# Patient Record
Sex: Female | Born: 1935 | Race: Black or African American | Hispanic: No | Marital: Married | Smoking: Never smoker
Health system: Southern US, Community
[De-identification: ages and names within clinical notes are randomized; demographics above are authoritative.]

## PROBLEM LIST (undated history)

## (undated) DIAGNOSIS — J189 Pneumonia, unspecified organism: Secondary | ICD-10-CM

## (undated) DIAGNOSIS — F0280 Dementia in other diseases classified elsewhere without behavioral disturbance: Secondary | ICD-10-CM

## (undated) DIAGNOSIS — E785 Hyperlipidemia, unspecified: Secondary | ICD-10-CM

## (undated) DIAGNOSIS — G2581 Restless legs syndrome: Secondary | ICD-10-CM

## (undated) DIAGNOSIS — E871 Hypo-osmolality and hyponatremia: Secondary | ICD-10-CM

## (undated) DIAGNOSIS — G309 Alzheimer's disease, unspecified: Secondary | ICD-10-CM

## (undated) DIAGNOSIS — K219 Gastro-esophageal reflux disease without esophagitis: Secondary | ICD-10-CM

## (undated) DIAGNOSIS — I10 Essential (primary) hypertension: Secondary | ICD-10-CM

## (undated) DIAGNOSIS — K5909 Other constipation: Secondary | ICD-10-CM

## (undated) DIAGNOSIS — N189 Chronic kidney disease, unspecified: Secondary | ICD-10-CM

## (undated) DIAGNOSIS — F0390 Unspecified dementia without behavioral disturbance: Secondary | ICD-10-CM

## (undated) DIAGNOSIS — L8990 Pressure ulcer of unspecified site, unspecified stage: Secondary | ICD-10-CM

## (undated) DIAGNOSIS — E559 Vitamin D deficiency, unspecified: Secondary | ICD-10-CM

## (undated) DIAGNOSIS — E7800 Pure hypercholesterolemia, unspecified: Secondary | ICD-10-CM

## (undated) DIAGNOSIS — R7303 Prediabetes: Secondary | ICD-10-CM

## (undated) DIAGNOSIS — I639 Cerebral infarction, unspecified: Secondary | ICD-10-CM

## (undated) DIAGNOSIS — R569 Unspecified convulsions: Secondary | ICD-10-CM

## (undated) DIAGNOSIS — M1990 Unspecified osteoarthritis, unspecified site: Secondary | ICD-10-CM

## (undated) DIAGNOSIS — H109 Unspecified conjunctivitis: Secondary | ICD-10-CM

## (undated) DIAGNOSIS — F419 Anxiety disorder, unspecified: Secondary | ICD-10-CM

## (undated) HISTORY — DX: Anxiety disorder, unspecified: F41.9

## (undated) HISTORY — DX: Unspecified osteoarthritis, unspecified site: M19.90

## (undated) HISTORY — PX: CATARACT EXTRACTION W/ INTRAOCULAR LENS  IMPLANT, BILATERAL: SHX1307

## (undated) HISTORY — DX: Chronic kidney disease, unspecified: N18.9

## (undated) HISTORY — DX: Unspecified conjunctivitis: H10.9

## (undated) HISTORY — DX: Restless legs syndrome: G25.81

## (undated) HISTORY — DX: Other constipation: K59.09

## (undated) HISTORY — PX: TOTAL KNEE ARTHROPLASTY: SHX125

## (undated) HISTORY — DX: Vitamin D deficiency, unspecified: E55.9

## (undated) HISTORY — PX: CATARACT EXTRACTION: SUR2

## (undated) HISTORY — DX: Hyperlipidemia, unspecified: E78.5

## (undated) HISTORY — DX: Pressure ulcer of unspecified site, unspecified stage: L89.90

---

## 1998-02-23 ENCOUNTER — Ambulatory Visit (HOSPITAL_COMMUNITY): Admission: RE | Admit: 1998-02-23 | Discharge: 1998-02-23 | Payer: Self-pay | Admitting: Cardiology

## 1998-12-21 ENCOUNTER — Other Ambulatory Visit: Admission: RE | Admit: 1998-12-21 | Discharge: 1998-12-21 | Payer: Self-pay | Admitting: *Deleted

## 1999-01-23 ENCOUNTER — Ambulatory Visit (HOSPITAL_COMMUNITY): Admission: RE | Admit: 1999-01-23 | Discharge: 1999-01-23 | Payer: Self-pay | Admitting: Cardiology

## 1999-12-24 ENCOUNTER — Other Ambulatory Visit: Admission: RE | Admit: 1999-12-24 | Discharge: 1999-12-24 | Payer: Self-pay | Admitting: *Deleted

## 2000-03-27 ENCOUNTER — Encounter: Payer: Self-pay | Admitting: Specialist

## 2000-04-04 ENCOUNTER — Inpatient Hospital Stay (HOSPITAL_COMMUNITY): Admission: RE | Admit: 2000-04-04 | Discharge: 2000-04-09 | Payer: Self-pay | Admitting: Specialist

## 2001-02-02 ENCOUNTER — Other Ambulatory Visit: Admission: RE | Admit: 2001-02-02 | Discharge: 2001-02-02 | Payer: Self-pay | Admitting: *Deleted

## 2006-02-10 ENCOUNTER — Ambulatory Visit (HOSPITAL_COMMUNITY): Admission: RE | Admit: 2006-02-10 | Discharge: 2006-02-10 | Payer: Self-pay | Admitting: Ophthalmology

## 2006-02-28 ENCOUNTER — Observation Stay (HOSPITAL_COMMUNITY): Admission: EM | Admit: 2006-02-28 | Discharge: 2006-03-01 | Payer: Self-pay | Admitting: Emergency Medicine

## 2006-07-02 ENCOUNTER — Emergency Department (HOSPITAL_COMMUNITY): Admission: EM | Admit: 2006-07-02 | Discharge: 2006-07-03 | Payer: Self-pay | Admitting: Emergency Medicine

## 2006-11-15 ENCOUNTER — Emergency Department (HOSPITAL_COMMUNITY): Admission: EM | Admit: 2006-11-15 | Discharge: 2006-11-15 | Payer: Self-pay | Admitting: Emergency Medicine

## 2008-05-02 ENCOUNTER — Emergency Department (HOSPITAL_COMMUNITY): Admission: EM | Admit: 2008-05-02 | Discharge: 2008-05-02 | Payer: Self-pay | Admitting: Emergency Medicine

## 2008-05-04 ENCOUNTER — Inpatient Hospital Stay (HOSPITAL_COMMUNITY): Admission: EM | Admit: 2008-05-04 | Discharge: 2008-05-13 | Payer: Self-pay | Admitting: Emergency Medicine

## 2008-10-02 ENCOUNTER — Emergency Department (HOSPITAL_COMMUNITY): Admission: EM | Admit: 2008-10-02 | Discharge: 2008-10-03 | Payer: Self-pay | Admitting: Emergency Medicine

## 2008-10-17 ENCOUNTER — Emergency Department (HOSPITAL_COMMUNITY): Admission: EM | Admit: 2008-10-17 | Discharge: 2008-10-17 | Payer: Self-pay | Admitting: Emergency Medicine

## 2008-12-08 ENCOUNTER — Emergency Department (HOSPITAL_COMMUNITY): Admission: EM | Admit: 2008-12-08 | Discharge: 2008-12-08 | Payer: Self-pay | Admitting: Emergency Medicine

## 2009-04-03 ENCOUNTER — Emergency Department (HOSPITAL_COMMUNITY): Admission: EM | Admit: 2009-04-03 | Discharge: 2009-04-03 | Payer: Self-pay | Admitting: Emergency Medicine

## 2009-04-17 ENCOUNTER — Ambulatory Visit: Payer: Self-pay | Admitting: Surgery

## 2009-04-17 ENCOUNTER — Encounter (INDEPENDENT_AMBULATORY_CARE_PROVIDER_SITE_OTHER): Payer: Self-pay | Admitting: Emergency Medicine

## 2009-04-17 ENCOUNTER — Emergency Department (HOSPITAL_COMMUNITY): Admission: EM | Admit: 2009-04-17 | Discharge: 2009-04-17 | Payer: Self-pay | Admitting: Emergency Medicine

## 2009-06-06 ENCOUNTER — Emergency Department (HOSPITAL_COMMUNITY): Admission: EM | Admit: 2009-06-06 | Discharge: 2009-06-06 | Payer: Self-pay | Admitting: Emergency Medicine

## 2009-10-31 ENCOUNTER — Emergency Department (HOSPITAL_COMMUNITY)
Admission: EM | Admit: 2009-10-31 | Discharge: 2009-10-31 | Payer: Self-pay | Source: Home / Self Care | Admitting: Emergency Medicine

## 2009-11-07 ENCOUNTER — Emergency Department (HOSPITAL_COMMUNITY): Admission: EM | Admit: 2009-11-07 | Discharge: 2009-11-07 | Payer: Self-pay | Admitting: Emergency Medicine

## 2009-11-24 ENCOUNTER — Emergency Department (HOSPITAL_COMMUNITY): Admission: EM | Admit: 2009-11-24 | Discharge: 2009-11-24 | Payer: Self-pay | Admitting: Emergency Medicine

## 2009-12-07 ENCOUNTER — Emergency Department (HOSPITAL_COMMUNITY): Admission: EM | Admit: 2009-12-07 | Discharge: 2009-12-07 | Payer: Self-pay | Admitting: Emergency Medicine

## 2009-12-20 ENCOUNTER — Inpatient Hospital Stay (HOSPITAL_COMMUNITY): Admission: EM | Admit: 2009-12-20 | Discharge: 2009-12-21 | Payer: Self-pay | Admitting: Emergency Medicine

## 2010-01-15 ENCOUNTER — Emergency Department (HOSPITAL_COMMUNITY): Admission: EM | Admit: 2010-01-15 | Discharge: 2010-01-15 | Payer: Self-pay | Admitting: Emergency Medicine

## 2010-01-28 ENCOUNTER — Emergency Department (HOSPITAL_COMMUNITY): Admission: EM | Admit: 2010-01-28 | Discharge: 2010-01-28 | Payer: Self-pay | Admitting: Emergency Medicine

## 2010-02-05 ENCOUNTER — Emergency Department (HOSPITAL_COMMUNITY): Admission: EM | Admit: 2010-02-05 | Discharge: 2010-02-05 | Payer: Self-pay | Admitting: Emergency Medicine

## 2010-05-20 ENCOUNTER — Emergency Department (HOSPITAL_COMMUNITY)
Admission: EM | Admit: 2010-05-20 | Discharge: 2010-05-20 | Payer: Self-pay | Source: Home / Self Care | Admitting: Emergency Medicine

## 2010-08-22 ENCOUNTER — Inpatient Hospital Stay (HOSPITAL_COMMUNITY)
Admission: EM | Admit: 2010-08-22 | Discharge: 2010-08-25 | Payer: Self-pay | Source: Home / Self Care | Attending: Internal Medicine | Admitting: Internal Medicine

## 2010-08-27 LAB — CBC
HCT: 32.1 % — ABNORMAL LOW (ref 36.0–46.0)
HCT: 34.9 % — ABNORMAL LOW (ref 36.0–46.0)
HCT: 35.2 % — ABNORMAL LOW (ref 36.0–46.0)
HCT: 31.6 % — ABNORMAL LOW (ref 36.0–46.0)
Hemoglobin: 10.9 g/dL — ABNORMAL LOW (ref 12.0–15.0)
Hemoglobin: 11.3 g/dL — ABNORMAL LOW (ref 12.0–15.0)
Hemoglobin: 11.6 g/dL — ABNORMAL LOW (ref 12.0–15.0)
Hemoglobin: 10.6 g/dL — ABNORMAL LOW (ref 12.0–15.0)
MCH: 29.4 pg (ref 26.0–34.0)
MCH: 30.6 pg (ref 26.0–34.0)
MCH: 30.7 pg (ref 26.0–34.0)
MCH: 30.4 pg (ref 26.0–34.0)
MCHC: 32.4 g/dL (ref 30.0–36.0)
MCHC: 33 g/dL (ref 30.0–36.0)
MCHC: 34 g/dL (ref 30.0–36.0)
MCHC: 33.5 g/dL (ref 30.0–36.0)
MCV: 90.4 fL (ref 78.0–100.0)
MCV: 90.9 fL (ref 78.0–100.0)
MCV: 92.9 fL (ref 78.0–100.0)
MCV: 90.5 fL (ref 78.0–100.0)
Platelets: 176 10*3/uL (ref 150–400)
Platelets: 182 10*3/uL (ref 150–400)
Platelets: 207 10*3/uL (ref 150–400)
Platelets: 197 10*3/uL (ref 150–400)
RBC: 3.55 MIL/uL — ABNORMAL LOW (ref 3.87–5.11)
RBC: 3.79 MIL/uL — ABNORMAL LOW (ref 3.87–5.11)
RBC: 3.84 MIL/uL — ABNORMAL LOW (ref 3.87–5.11)
RBC: 3.49 MIL/uL — ABNORMAL LOW (ref 3.87–5.11)
RDW: 13.1 % (ref 11.5–15.5)
RDW: 13.1 % (ref 11.5–15.5)
RDW: 13.2 % (ref 11.5–15.5)
RDW: 13.1 % (ref 11.5–15.5)
WBC: 6.6 10*3/uL (ref 4.0–10.5)
WBC: 6.9 10*3/uL (ref 4.0–10.5)
WBC: 7 10*3/uL (ref 4.0–10.5)
WBC: 9.8 10*3/uL (ref 4.0–10.5)

## 2010-08-27 LAB — CARDIAC PANEL(CRET KIN+CKTOT+MB+TROPI)
CK, MB: 1.9 ng/mL (ref 0.3–4.0)
CK, MB: 1.6 ng/mL (ref 0.3–4.0)
Relative Index: INVALID (ref 0.0–2.5)
Relative Index: INVALID (ref 0.0–2.5)
Total CK: 77 U/L (ref 7–177)
Total CK: 66 U/L (ref 7–177)
Troponin I: 0.02 ng/mL (ref 0.00–0.06)
Troponin I: 0.02 ng/mL (ref 0.00–0.06)

## 2010-08-27 LAB — URINALYSIS, ROUTINE W REFLEX MICROSCOPIC
Bilirubin Urine: NEGATIVE
Hgb urine dipstick: NEGATIVE
Ketones, ur: NEGATIVE mg/dL
Nitrite: NEGATIVE
Protein, ur: NEGATIVE mg/dL
Specific Gravity, Urine: 1.02 (ref 1.005–1.030)
Urine Glucose, Fasting: NEGATIVE mg/dL
Urobilinogen, UA: 0.2 mg/dL (ref 0.0–1.0)
pH: 6 (ref 5.0–8.0)

## 2010-08-27 LAB — COMPREHENSIVE METABOLIC PANEL
ALT: 12 U/L (ref 0–35)
ALT: 13 U/L (ref 0–35)
ALT: 12 U/L (ref 0–35)
AST: 19 U/L (ref 0–37)
AST: 22 U/L (ref 0–37)
AST: 20 U/L (ref 0–37)
Albumin: 3.2 g/dL — ABNORMAL LOW (ref 3.5–5.2)
Albumin: 3.4 g/dL — ABNORMAL LOW (ref 3.5–5.2)
Albumin: 3 g/dL — ABNORMAL LOW (ref 3.5–5.2)
Alkaline Phosphatase: 60 U/L (ref 39–117)
Alkaline Phosphatase: 67 U/L (ref 39–117)
Alkaline Phosphatase: 60 U/L (ref 39–117)
BUN: 26 mg/dL — ABNORMAL HIGH (ref 6–23)
BUN: 30 mg/dL — ABNORMAL HIGH (ref 6–23)
BUN: 20 mg/dL (ref 6–23)
CO2: 27 mEq/L (ref 19–32)
CO2: 28 mEq/L (ref 19–32)
CO2: 27 mEq/L (ref 19–32)
Calcium: 8.8 mg/dL (ref 8.4–10.5)
Calcium: 9.4 mg/dL (ref 8.4–10.5)
Calcium: 8.8 mg/dL (ref 8.4–10.5)
Chloride: 100 mEq/L (ref 96–112)
Chloride: 103 mEq/L (ref 96–112)
Chloride: 102 mEq/L (ref 96–112)
Creatinine, Ser: 1.38 mg/dL — ABNORMAL HIGH (ref 0.4–1.2)
Creatinine, Ser: 1.45 mg/dL — ABNORMAL HIGH (ref 0.4–1.2)
Creatinine, Ser: 1.48 mg/dL — ABNORMAL HIGH (ref 0.4–1.2)
GFR calc Af Amer: 42 mL/min — ABNORMAL LOW (ref >60)
GFR calc Af Amer: 45 mL/min — ABNORMAL LOW (ref >60)
GFR calc Af Amer: 41 mL/min — ABNORMAL LOW (ref >60)
GFR calc non Af Amer: 35 mL/min — ABNORMAL LOW (ref >60)
GFR calc non Af Amer: 37 mL/min — ABNORMAL LOW (ref >60)
GFR calc non Af Amer: 34 mL/min — ABNORMAL LOW (ref >60)
Glucose, Bld: 82 mg/dL (ref 70–99)
Glucose, Bld: 89 mg/dL (ref 70–99)
Glucose, Bld: 95 mg/dL (ref 70–99)
Potassium: 3.8 mEq/L (ref 3.5–5.1)
Potassium: 3.9 mEq/L (ref 3.5–5.1)
Potassium: 4 mEq/L (ref 3.5–5.1)
Sodium: 136 mEq/L (ref 135–145)
Sodium: 140 mEq/L (ref 135–145)
Sodium: 138 mEq/L (ref 135–145)
Total Bilirubin: 0.4 mg/dL (ref 0.3–1.2)
Total Bilirubin: 0.5 mg/dL (ref 0.3–1.2)
Total Bilirubin: 0.5 mg/dL (ref 0.3–1.2)
Total Protein: 6.5 g/dL (ref 6.0–8.3)
Total Protein: 7.2 g/dL (ref 6.0–8.3)
Total Protein: 6 g/dL (ref 6.0–8.3)

## 2010-08-27 LAB — DIFFERENTIAL
Basophils Absolute: 0 10*3/uL (ref 0.0–0.1)
Basophils Absolute: 0.1 10*3/uL (ref 0.0–0.1)
Basophils Relative: 0 % (ref 0–1)
Basophils Relative: 1 % (ref 0–1)
Eosinophils Absolute: 0.1 10*3/uL (ref 0.0–0.7)
Eosinophils Absolute: 0.2 10*3/uL (ref 0.0–0.7)
Eosinophils Relative: 2 % (ref 0–5)
Eosinophils Relative: 2 % (ref 0–5)
Lymphocytes Relative: 25 % (ref 12–46)
Lymphocytes Relative: 37 % (ref 12–46)
Lymphs Abs: 1.7 10*3/uL (ref 0.7–4.0)
Lymphs Abs: 2.4 10*3/uL (ref 0.7–4.0)
Monocytes Absolute: 0.4 10*3/uL (ref 0.1–1.0)
Monocytes Absolute: 0.6 10*3/uL (ref 0.1–1.0)
Monocytes Relative: 7 % (ref 3–12)
Monocytes Relative: 8 % (ref 3–12)
Neutro Abs: 3.6 10*3/uL (ref 1.7–7.7)
Neutro Abs: 4.5 10*3/uL (ref 1.7–7.7)
Neutrophils Relative %: 54 % (ref 43–77)
Neutrophils Relative %: 64 % (ref 43–77)

## 2010-08-27 LAB — MRSA PCR SCREENING: MRSA by PCR: NEGATIVE

## 2010-08-27 LAB — GLUCOSE, CAPILLARY
Glucose-Capillary: 82 mg/dL (ref 70–99)
Glucose-Capillary: 75 mg/dL (ref 70–99)
Glucose-Capillary: 126 mg/dL — ABNORMAL HIGH (ref 70–99)

## 2010-08-27 LAB — CK TOTAL AND CKMB (NOT AT ARMC)
CK, MB: 2.4 ng/mL (ref 0.3–4.0)
Relative Index: 1.9 (ref 0.0–2.5)
Total CK: 125 U/L (ref 7–177)

## 2010-08-27 LAB — BASIC METABOLIC PANEL
BUN: 34 mg/dL — ABNORMAL HIGH (ref 6–23)
CO2: 24 mEq/L (ref 19–32)
Calcium: 8.7 mg/dL (ref 8.4–10.5)
Chloride: 99 mEq/L (ref 96–112)
Creatinine, Ser: 1.53 mg/dL — ABNORMAL HIGH (ref 0.4–1.2)
GFR calc Af Amer: 40 mL/min — ABNORMAL LOW (ref >60)
GFR calc non Af Amer: 33 mL/min — ABNORMAL LOW (ref >60)
Glucose, Bld: 90 mg/dL (ref 70–99)
Potassium: 4 mEq/L (ref 3.5–5.1)
Sodium: 134 mEq/L — ABNORMAL LOW (ref 135–145)

## 2010-08-27 LAB — TROPONIN I: Troponin I: 0.01 ng/mL (ref 0.00–0.06)

## 2010-10-28 LAB — POCT I-STAT, CHEM 8
Calcium, Ion: 1.14 mmol/L (ref 1.12–1.32)
Chloride: 101 mEq/L (ref 96–112)
HCT: 34 % — ABNORMAL LOW (ref 36.0–46.0)
Sodium: 137 mEq/L (ref 135–145)
TCO2: 27 mmol/L (ref 0–100)

## 2010-10-28 LAB — GLUCOSE, CAPILLARY: Glucose-Capillary: 73 mg/dL (ref 70–99)

## 2010-10-28 LAB — URINALYSIS, ROUTINE W REFLEX MICROSCOPIC
Glucose, UA: NEGATIVE mg/dL
Hgb urine dipstick: NEGATIVE
Protein, ur: NEGATIVE mg/dL
Specific Gravity, Urine: 1.01 (ref 1.005–1.030)
pH: 7.5 (ref 5.0–8.0)

## 2010-10-28 LAB — CBC
MCV: 95.2 fL (ref 78.0–100.0)
Platelets: 141 10*3/uL — ABNORMAL LOW (ref 150–400)
RDW: 13.3 % (ref 11.5–15.5)
WBC: 5.9 10*3/uL (ref 4.0–10.5)

## 2010-10-28 LAB — POCT CARDIAC MARKERS
Myoglobin, poc: 103 ng/mL (ref 12–200)
Troponin i, poc: <0.05 ng/mL (ref 0.00–0.09)

## 2010-10-29 LAB — URINE MICROSCOPIC-ADD ON

## 2010-10-29 LAB — CBC
HCT: 27.8 % — ABNORMAL LOW (ref 36.0–46.0)
MCHC: 33.6 g/dL (ref 30.0–36.0)
MCV: 96 fL (ref 78.0–100.0)
Platelets: 131 10*3/uL — ABNORMAL LOW (ref 150–400)
RDW: 13.9 % (ref 11.5–15.5)

## 2010-10-29 LAB — URINALYSIS, ROUTINE W REFLEX MICROSCOPIC
Bilirubin Urine: NEGATIVE
Glucose, UA: NEGATIVE mg/dL
Ketones, ur: NEGATIVE mg/dL
pH: 5.5 (ref 5.0–8.0)

## 2010-10-29 LAB — DIFFERENTIAL
Basophils Absolute: 0 10*3/uL (ref 0.0–0.1)
Eosinophils Absolute: 0.2 10*3/uL (ref 0.0–0.7)
Eosinophils Relative: 3 % (ref 0–5)
Lymphs Abs: 2.4 10*3/uL (ref 0.7–4.0)

## 2010-10-29 LAB — URINE CULTURE: Colony Count: >=100000

## 2010-10-30 LAB — CK TOTAL AND CKMB (NOT AT ARMC)
CK, MB: 2.1 ng/mL (ref 0.3–4.0)
Relative Index: 2 (ref 0.0–2.5)
Relative Index: INVALID (ref 0.0–2.5)
Relative Index: INVALID (ref 0.0–2.5)
Total CK: 76 U/L (ref 7–177)

## 2010-10-30 LAB — GLUCOSE, CAPILLARY: Glucose-Capillary: 76 mg/dL (ref 70–99)

## 2010-10-30 LAB — CBC
Hemoglobin: 10.2 g/dL — ABNORMAL LOW (ref 12.0–15.0)
MCHC: 34.7 g/dL (ref 30.0–36.0)
MCV: 95.2 fL (ref 78.0–100.0)
Platelets: 148 10*3/uL — ABNORMAL LOW (ref 150–400)
RDW: 14.9 % (ref 11.5–15.5)
WBC: 5.4 10*3/uL (ref 4.0–10.5)

## 2010-10-30 LAB — DIFFERENTIAL
Basophils Absolute: 0.1 10*3/uL (ref 0.0–0.1)
Basophils Relative: 1 % (ref 0–1)
Eosinophils Absolute: 0.2 10*3/uL (ref 0.0–0.7)
Lymphs Abs: 2.7 10*3/uL (ref 0.7–4.0)
Neutrophils Relative %: 40 % — ABNORMAL LOW (ref 43–77)

## 2010-10-30 LAB — COMPREHENSIVE METABOLIC PANEL
Alkaline Phosphatase: 62 U/L (ref 39–117)
BUN: 26 mg/dL — ABNORMAL HIGH (ref 6–23)
Glucose, Bld: 96 mg/dL (ref 70–99)
Potassium: 3.6 mEq/L (ref 3.5–5.1)
Total Protein: 6.2 g/dL (ref 6.0–8.3)

## 2010-10-30 LAB — BASIC METABOLIC PANEL
BUN: 33 mg/dL — ABNORMAL HIGH (ref 6–23)
Chloride: 100 mEq/L (ref 96–112)
Creatinine, Ser: 1.71 mg/dL — ABNORMAL HIGH (ref 0.4–1.2)

## 2010-10-30 LAB — TROPONIN I
Troponin I: 0.01 ng/mL (ref 0.00–0.06)
Troponin I: <0.01 ng/mL (ref 0.00–0.06)

## 2010-10-30 LAB — VALPROIC ACID LEVEL: Valproic Acid Lvl: 66.1 ug/mL (ref 50.0–100.0)

## 2010-11-02 LAB — GLUCOSE, CAPILLARY

## 2010-11-02 LAB — URINALYSIS, ROUTINE W REFLEX MICROSCOPIC
Nitrite: NEGATIVE
Protein, ur: NEGATIVE mg/dL
Specific Gravity, Urine: 1.007 (ref 1.005–1.030)
Urobilinogen, UA: 0.2 mg/dL (ref 0.0–1.0)

## 2010-11-02 LAB — DIFFERENTIAL
Lymphocytes Relative: 31 % (ref 12–46)
Lymphs Abs: 2.3 10*3/uL (ref 0.7–4.0)
Monocytes Relative: 8 % (ref 3–12)
Neutro Abs: 4.3 10*3/uL (ref 1.7–7.7)
Neutrophils Relative %: 59 % (ref 43–77)

## 2010-11-02 LAB — POCT I-STAT, CHEM 8
BUN: 56 mg/dL — ABNORMAL HIGH (ref 6–23)
Chloride: 100 mEq/L (ref 96–112)
HCT: 31 % — ABNORMAL LOW (ref 36.0–46.0)
Sodium: 135 mEq/L (ref 135–145)
TCO2: 29 mmol/L (ref 0–100)

## 2010-11-02 LAB — BASIC METABOLIC PANEL
Calcium: 9.1 mg/dL (ref 8.4–10.5)
Chloride: 94 mEq/L — ABNORMAL LOW (ref 96–112)
Creatinine, Ser: 2.94 mg/dL — ABNORMAL HIGH (ref 0.4–1.2)
GFR calc Af Amer: 19 mL/min — ABNORMAL LOW (ref >60)
GFR calc non Af Amer: 16 mL/min — ABNORMAL LOW (ref >60)

## 2010-11-02 LAB — URINE MICROSCOPIC-ADD ON

## 2010-11-02 LAB — VALPROIC ACID LEVEL: Valproic Acid Lvl: 90.8 ug/mL (ref 50.0–100.0)

## 2010-11-02 LAB — CBC
MCV: 94 fL (ref 78.0–100.0)
RBC: 3.18 MIL/uL — ABNORMAL LOW (ref 3.87–5.11)
WBC: 7.3 10*3/uL (ref 4.0–10.5)

## 2010-11-02 LAB — POCT CARDIAC MARKERS
Myoglobin, poc: 145 ng/mL (ref 12–200)
Troponin i, poc: <0.05 ng/mL (ref 0.00–0.09)

## 2010-11-15 LAB — DIFFERENTIAL
Basophils Absolute: 0 10*3/uL (ref 0.0–0.1)
Basophils Relative: 1 % (ref 0–1)
Lymphocytes Relative: 38 % (ref 12–46)
Monocytes Absolute: 0.3 10*3/uL (ref 0.1–1.0)
Neutro Abs: 2.9 10*3/uL (ref 1.7–7.7)
Neutrophils Relative %: 55 % (ref 43–77)

## 2010-11-15 LAB — URINALYSIS, ROUTINE W REFLEX MICROSCOPIC
Bilirubin Urine: NEGATIVE
Ketones, ur: NEGATIVE mg/dL
Nitrite: NEGATIVE
Urobilinogen, UA: 0.2 mg/dL (ref 0.0–1.0)
pH: 7.5 (ref 5.0–8.0)

## 2010-11-15 LAB — POCT I-STAT, CHEM 8
Creatinine, Ser: 1.6 mg/dL — ABNORMAL HIGH (ref 0.4–1.2)
HCT: 35 % — ABNORMAL LOW (ref 36.0–46.0)
Hemoglobin: 11.9 g/dL — ABNORMAL LOW (ref 12.0–15.0)
Potassium: 4.2 mEq/L (ref 3.5–5.1)
Sodium: 138 mEq/L (ref 135–145)
TCO2: 29 mmol/L (ref 0–100)

## 2010-11-15 LAB — GLUCOSE, CAPILLARY
Glucose-Capillary: 104 mg/dL — ABNORMAL HIGH (ref 70–99)
Glucose-Capillary: 66 mg/dL — ABNORMAL LOW (ref 70–99)
Glucose-Capillary: 69 mg/dL — ABNORMAL LOW (ref 70–99)

## 2010-11-15 LAB — CBC
Hemoglobin: 11.1 g/dL — ABNORMAL LOW (ref 12.0–15.0)
MCHC: 33.3 g/dL (ref 30.0–36.0)
Platelets: 200 10*3/uL (ref 150–400)
RDW: 14.7 % (ref 11.5–15.5)

## 2010-11-15 LAB — VALPROIC ACID LEVEL: Valproic Acid Lvl: 75.6 ug/mL (ref 50.0–100.0)

## 2010-11-16 LAB — POCT I-STAT, CHEM 8
Calcium, Ion: 1.16 mmol/L (ref 1.12–1.32)
Creatinine, Ser: 1.9 mg/dL — ABNORMAL HIGH (ref 0.4–1.2)
Glucose, Bld: 74 mg/dL (ref 70–99)
HCT: 31 % — ABNORMAL LOW (ref 36.0–46.0)
Hemoglobin: 10.5 g/dL — ABNORMAL LOW (ref 12.0–15.0)
Potassium: 4.4 mEq/L (ref 3.5–5.1)

## 2010-11-17 LAB — URINALYSIS, ROUTINE W REFLEX MICROSCOPIC
Glucose, UA: NEGATIVE mg/dL
Hgb urine dipstick: NEGATIVE
Protein, ur: NEGATIVE mg/dL
pH: 5.5 (ref 5.0–8.0)

## 2010-11-17 LAB — DIFFERENTIAL
Basophils Absolute: 0.1 10*3/uL (ref 0.0–0.1)
Basophils Relative: 2 % — ABNORMAL HIGH (ref 0–1)
Eosinophils Absolute: 0 10*3/uL (ref 0.0–0.7)
Monocytes Relative: 5 % (ref 3–12)
Neutro Abs: 3.7 10*3/uL (ref 1.7–7.7)
Neutrophils Relative %: 69 % (ref 43–77)

## 2010-11-17 LAB — ETHANOL: Alcohol, Ethyl (B): <5 mg/dL (ref 0–10)

## 2010-11-17 LAB — COMPREHENSIVE METABOLIC PANEL
ALT: 14 U/L (ref 0–35)
Alkaline Phosphatase: 70 U/L (ref 39–117)
BUN: 38 mg/dL — ABNORMAL HIGH (ref 6–23)
CO2: 25 mEq/L (ref 19–32)
Chloride: 106 mEq/L (ref 96–112)
Glucose, Bld: 95 mg/dL (ref 70–99)
Potassium: 4.9 mEq/L (ref 3.5–5.1)
Sodium: 138 mEq/L (ref 135–145)
Total Bilirubin: 0.5 mg/dL (ref 0.3–1.2)

## 2010-11-17 LAB — RAPID URINE DRUG SCREEN, HOSP PERFORMED
Cocaine: NOT DETECTED
Tetrahydrocannabinol: NOT DETECTED

## 2010-11-17 LAB — URINE CULTURE: Colony Count: 15000

## 2010-11-17 LAB — CBC
HCT: 30.3 % — ABNORMAL LOW (ref 36.0–46.0)
Hemoglobin: 10.2 g/dL — ABNORMAL LOW (ref 12.0–15.0)
RBC: 3.45 MIL/uL — ABNORMAL LOW (ref 3.87–5.11)
WBC: 5.4 10*3/uL (ref 4.0–10.5)

## 2010-11-21 LAB — CBC
MCHC: 33.3 g/dL (ref 30.0–36.0)
MCV: 86.6 fL (ref 78.0–100.0)
Platelets: 236 10*3/uL (ref 150–400)
RBC: 3.43 MIL/uL — ABNORMAL LOW (ref 3.87–5.11)
WBC: 6 10*3/uL (ref 4.0–10.5)

## 2010-11-21 LAB — BASIC METABOLIC PANEL
BUN: 26 mg/dL — ABNORMAL HIGH (ref 6–23)
CO2: 26 mEq/L (ref 19–32)
Calcium: 9.1 mg/dL (ref 8.4–10.5)
Chloride: 107 mEq/L (ref 96–112)
Creatinine, Ser: 1.44 mg/dL — ABNORMAL HIGH (ref 0.4–1.2)
GFR calc Af Amer: 43 mL/min — ABNORMAL LOW (ref >60)

## 2010-11-21 LAB — URINALYSIS, ROUTINE W REFLEX MICROSCOPIC
Nitrite: NEGATIVE
Protein, ur: NEGATIVE mg/dL
Specific Gravity, Urine: 1.003 — ABNORMAL LOW (ref 1.005–1.030)
Urobilinogen, UA: 0.2 mg/dL (ref 0.0–1.0)

## 2010-11-21 LAB — DIFFERENTIAL
Basophils Relative: 1 % (ref 0–1)
Eosinophils Absolute: 0.3 10*3/uL (ref 0.0–0.7)
Neutro Abs: 3.4 10*3/uL (ref 1.7–7.7)
Neutrophils Relative %: 57 % (ref 43–77)

## 2010-11-21 LAB — BRAIN NATRIURETIC PEPTIDE: Pro B Natriuretic peptide (BNP): 118 pg/mL — ABNORMAL HIGH (ref 0.0–100.0)

## 2010-11-22 LAB — BASIC METABOLIC PANEL
Calcium: 9.2 mg/dL (ref 8.4–10.5)
Creatinine, Ser: 1.46 mg/dL — ABNORMAL HIGH (ref 0.4–1.2)
GFR calc Af Amer: 42 mL/min — ABNORMAL LOW (ref >60)
GFR calc non Af Amer: 35 mL/min — ABNORMAL LOW (ref >60)
Sodium: 140 mEq/L (ref 135–145)

## 2010-11-22 LAB — CBC
MCHC: 34.7 g/dL (ref 30.0–36.0)
RBC: 3.25 MIL/uL — ABNORMAL LOW (ref 3.87–5.11)
WBC: 5.8 10*3/uL (ref 4.0–10.5)

## 2010-11-22 LAB — DIFFERENTIAL
Basophils Relative: 1 % (ref 0–1)
Lymphocytes Relative: 38 % (ref 12–46)
Monocytes Relative: 6 % (ref 3–12)
Neutro Abs: 3.1 10*3/uL (ref 1.7–7.7)
Neutrophils Relative %: 53 % (ref 43–77)

## 2010-11-27 LAB — URINALYSIS, ROUTINE W REFLEX MICROSCOPIC
Hgb urine dipstick: NEGATIVE
Nitrite: NEGATIVE
Specific Gravity, Urine: 1.013 (ref 1.005–1.030)
Urobilinogen, UA: 0.2 mg/dL (ref 0.0–1.0)
pH: 7 (ref 5.0–8.0)

## 2010-12-25 NOTE — Procedures (Signed)
EEG NUMBER:  [id number].   TECHNICIAN:  Harriett Sine.   REFERRING PHYSICIAN:  Eduardo Osier. Sharyn Lull, MD   Report date is May 09, 2008, for this portable EEG recording, but  had neither hyperventilation nor photic stimulation included.  The  patient is described as a 77 year old female, awake and confused during  this EEG recording.  A right-handed individual on the current medication  of Ventolin, aspirin, diltiazem, Lovenox, folic acid, Cozaar, Namenda,  Singulair, Protonix, Haldol, and Ativan.  She was admitted on May 04, 2008, with a history of 3 days of altered mental status and became  increasingly more confused and agitated.  She had a generalized seizure in the ED witnessed by the physicians and  was diagnosed with metabolic encephalopathy.  She has a history of  beginning dementia, hypertension, and diabetes mellitus.  Main concern  for this EEG recording is to rule out an underlying CVA and to establish  the likelihood of another seizure.   DESCRIPTION:  The 16 channel EEG recording with 1 channel representing  heart rate and rhythm exclusively was performed on May 09, 2008.  In a referential bipolar montage, a posterior dominant background rhythm  is truly best established for the outside areas of the double banana  montage.  Here high amplitudes in delta and theta frequencies  predominates.  The more central leads show a lower amplitudes and  slightly higher frequencies still remaining in the theta range.  The EKG  remains in normal sinus rhythm throughout.  The patient appears to be  blinking, is moving frequently, is picking at blankets and cables and  after the technician covered her with an extra blanket, was noticed to  fall asleep and snore.  Even during snoring, there is constant jaw and  facial movement.  Normal REM sleep stages I and II are seen alternating  this rapid head movements and then again the soft snoring which creates  another artifact.  Towards the  end of the study, there is a seizure-like  activity noticed at 3 Hz.  This seems to be a sharp wave formation not a  spike wave formation and occurs in normal REM sleep.  It is  symmetrically seen over C3 and C4; however, the great regularity of  these discharges makes it unlikely to be a simple sleep architecture.  Shortly afterwards the patient awoke and became very fidgety and  restless again, no epileptiform discharges are now seen.  Occasional  sleep spindles are still noted when the patient again became drowsy.  The EEG is asymmetric during sleep and shows amplitude suppression  throughout the left hemisphere in comparison to the right.   CONCLUSION:  1. This is an abnormal EEG with generalized slowing theta range      frequencies.  2. A phenomenon end sleep that can only be described as an equivalent      of sleep hyper synchrony with 3 Hz discharges and may be      epileptiform in nature.  3. A suppression of amplitude and of her regular EEG activity      throughout the F7 to T5 region of the brain.  A simple metabolic      encephalopathy should not leave this kind of asymmetry, but should      be reflected in a      generalized slowing.  I would therefore recommend to evaluate the      patient as an imaging study to rule out CVA.  Melvyn Novas, M.D.  Electronically Signed     [id number]  D:  05/09/2008 17:25:38  T:  05/10/2008 05:22:56  Job #:  [id number]   cc:   Eduardo Osier. Sharyn Lull, M.D.  Fax: [phone]

## 2010-12-25 NOTE — Discharge Summary (Signed)
[name], [name]             ACCOUNT NO.:  [id number]   MEDICAL RECORD NO.:  [id number]          PATIENT TYPE:  INP   LOCATION:  3025                         FACILITY:  [facility]   PHYSICIAN:  Eduardo Osier. Sharyn Lull, M.D. DATE OF BIRTH:  08-14-1930   DATE OF ADMISSION:  05/04/2008  DATE OF DISCHARGE:  05/13/2008                               DISCHARGE SUMMARY   ADMITTING DIAGNOSES:  1. Metabolic encephalopathy, rule out cerebrovascular accident.  2. Status post tonic-clonic seizures.  3. Hyponatremia.  4. Hypokalemia.  5. Hypertension.  6. Non-insulin-dependent diabetes mellitus, controlled by diet.  7. Progressive dementia.  8. Hypercholesteremia.   DISCHARGE DIAGNOSES:  1. Status post metabolic encephalopathy.  2. Status post tonic-clonic seizures.  3. Status post hyponatremia.  4. Status post hypokalemia.  5. Hypertension.  6. Non-insulin-dependent diabetes mellitus, controlled by diet.  7. Progressive dementia.  8. Hypercholesteremia.  9. Acute on chronic anemia, stable.   DISCHARGE HOME MEDICATIONS:  1. Enteric-coated aspirin 81 mg 1 tablet daily.  2. Cozaar 100 mg 1 tablet daily.  3. Cardizem CD 240 mg 1 capsule daily in the morning.  4. Namenda 10 mg 1 tablet twice daily.  5. Folic acid 1 mg 1 tablet daily.  6. Protonix 40 mg 1 tablet daily.  7. Ativan 1 mg daily at night as needed.  8. Singulair 10 mg 1 tablet daily.  9. Crestor 5 mg 1 tablet daily.   DIET:  Low salt, low cholesterol.  The patient has been advised to avoid  sweets.   CBC and BMET in 1 week.   CONDITION AT DISCHARGE:  Stable.   BRIEF HISTORY AND HOSPITAL COURSE:  Ms. [name] is a 77 year old black  female with past medical history significant for coronary artery  disease, hypertension, non-insulin-dependent diabetes mellitus  controlled by diet, hypercholesteremia, and dementia.  She came to the  ER by family because of progressive confusion associated with  hallucination per husband.  She  was not able to move her right arm and  leg and had slurred speech.  The patient was noted to have tonic-clonic  seizures in the ED.  The patient received Ativan and Dilantin IV.  Presently, the patient is lethargic and post ictal per family.  She  denies any headache or history of seizures in the past.  Denies any  fevers, chills, cough, or urinary complaints.  Denies chest pain or  shortness of breath.  Denies nausea, vomiting, or diaphoresis.  The  patient was seen in the ED on May 02, 2008, 2 days ago for similar  complaints of progressive confusion.   PAST MEDICAL HISTORY:  As above.   PAST SURGICAL HISTORY:  She had bilateral cataract extraction and  bilateral total knee replacement, and had cholecystectomy in the past.   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  No history of alcohol or tobacco abuse.   FAMILY HISTORY:  Positive for coronary artery disease.   PHYSICAL EXAMINATION:  GENERAL:  She is  lethargic, responds to painful  stimuli by opening her eyes, postictal.  VITAL SIGNS:  Blood pressure was 118/69, pulse was 94, respirations 18,  and saturations were 99%.  She was afebrile.  HEENT:  Conjunctivae were pink.  Pupils equal and reactive.  NECK:  Supple.  No JVD.  LUNGS:  Decreased breath sounds at bases.  No rales or rhonchi.  CARDIOVASCULAR:  S1 and S2 was normal.  There was a soft systolic  murmur.  ABDOMEN:  Soft.  Bowel sounds were present.  Nontender.  EXTREMITIES:  There is no clubbing, cyanosis, or edema.  NEURO:  She was lethargic but was able to move all the extremities to  painful stimuli.   LABORATORY DATA:  Hemoglobin was 12.3, hematocrit 36.5, and white count  of 10.1.  Sodium was 126, potassium was 2.7, chloride 85, glucose was  120, bicarb 28, BUN 16, and creatinine 1.47.  CT of the brain showed no  evidence of infarct,  small vessel disease.  The patient was admitted to  the telemetry unit and was started on IV normal saline.  Urine sodium  and potassium were obtained.  The patient's sodium gradually came back  to normal range.  The patient did not had any episodes of seizures  during the hospital stay.  Her Dilantin was deceased.  The patient had  multiple episodes of confusion and agitation which have gradually  improved over time.  The patient was also initially started on Avelox  for possible aspiration which was discontinued after 3 days.  The  patient remained afebrile during the hospital stay.  Her hemoglobin has  dropped from 12 to 10, probably secondary to hydration.  The patient's  hemoglobin has been stable for last 2 days.  There is no evidence of  active bleeding.  The patient has been ambulating in hallway without any  problems with assistance.  The patient will be discharged home on above  medications and will be followed up in my office in 1 week.  We will do  the anemia workup as outpatient.      Eduardo Osier. Sharyn Lull, M.D.  Electronically Signed    MNH/MEDQ  D:  05/13/2008  T:  05/14/2008  Job:  [id number]

## 2010-12-25 NOTE — Consult Note (Signed)
NAMEALAZIA, CROCKET NO.:  [id number]   MEDICAL RECORD NO.:  [id number]          PATIENT TYPE:  INP   LOCATION:  3040                         FACILITY:  [facility]   PHYSICIAN:  Noel Christmas, MD    DATE OF BIRTH:  09-11-1930   DATE OF CONSULTATION:  05/07/2008  DATE OF DISCHARGE:                                 CONSULTATION   REFERRING PHYSICIAN:  Mohan N. Sharyn Lull, MD   REASON FOR CONSULTATION:  Altered mental status and generalized seizure.   This is a 77 year old lady who was admitted on May 04, 2008, with  a history of mental status changes for about 3 days.  The patient has a  history of dementia, but was noted to become increasingly confused and  agitated per husband.  In the emergency room, she had a witnessed  generalized seizure.  There is no history of seizure activity  previously.  CT of her head showed no focal abnormalities.  Atrophy and  microvascular changes were noted diffusely.  Laboratory studies were  significant for hyponatremia with sodium of 126 and hypokalemia with  potassium of 2.7.  She was given Ativan followed by Dilantin and was  treated with Dilantin for the next 2 days.  Her sodium has since  corrected.  She has not had a recurrent seizure.  She has remained  confused and agitated and has required chemical restraint with Haldol as  well as pose restraint to keep her in bed.  No focal abnormalities have  been noted.   PAST MEDICAL HISTORY:  Remarkable for:  1. Dementia.  2. Hypertension.  3. Diabetes mellitus.  4. Hypercholesterolemia.  5. Arthritis with bilateral total knee replacement.   CURRENT MEDICATIONS:  1. Aspirin 325 mg per day.  2. Singulair 10 mg per day.  3. Trifluoperazine 5 mg per day.  4. Albuterol inhaler p.r.n.  5. Namenda 10 mg b.i.d.  6. Premarin 0.625 mg per day.  7. Diltiazem 240 mg per day.  8. Crestor 10 mg per day.  9. She has been on Dilantin 200 mg per day since admission.  Dilantin      was  discontinued earlier today.   FAMILY HISTORY:  Noncontributory.   PHYSICAL EXAMINATION:  GENERAL:  This was an elderly African American  lady of medium built who was confused and agitated.  She was oriented to  person, but disoriented to place as well as time.  She followed simple  commands fairly well.  There was no indication of receptive or  expressive language problem.  HEENT:  Pupils were equal and reactive to light.  Extraocular movements  and visual fields were intact and normal.  There was no facial weakness.  Hearing was normal.  Speech and palatal movement were normal.  EXTREMITIES:  Coordination of extremities was normal.  Strength was  normal and symmetrical in all 4 extremities.  Deep tendon reflexes were  normal and symmetrical with absent ankle reflexes.  She has mild frontal  release  signs.  Plantar responses were flexor.   CLINICAL IMPRESSION:  1. Altered mental status secondary to underlying dementia with  exacerbation due to metabolic encephalopathy which was resolving.      In addition, the patient was also in unfamiliar surroundings and      probably also has experienced an alteration and her usual wake-      sleep cycle.  2. Generalized seizure on admission secondary to hyponatremia,      resolved.  3. No signs of an acute stroke, no acute intracranial abnormality,      otherwise.   RECOMMENDATIONS:  1. Agree with discontinuing Dilantin at this point since hyponatremia      has resolved and no other etiology for seizure activity has been      demonstrated.  2. Continue Namenda 10 mg twice a day.  3. Chemical and physical restraint as needed for the patient's safety      as well as that of the ward staff.  4. No further neuro-diagnostic testing is indicating including no EEGs      since seizure was most likely secondary to hyponatremia.   Thank you for asking me to evaluate Ms. Dearing.      Noel Christmas, MD  Electronically Signed      CS/MEDQ  D:  05/07/2008  T:  05/07/2008  Job:  [id number]

## 2010-12-28 NOTE — H&P (Signed)
[facility]  Patient:    [name], [name]                   MRN: [id number] Adm. Date:  03/27/00 Attending:  Philips J. Montez Morita, M.D. Dictator:   Druscilla Brownie. Shela Nevin, P.A. CC:         Juluis Mire, M.D.  Eduardo Osier. Sharyn Lull, M.D.   History and Physical  DATE OF BIRTH:  10/03/1930.  CHIEF COMPLAINT:  "Pain in my left knee."  PRESENT ILLNESS:  This is a 77 year old lady who has been seen by [place] for continuing problems concerning her knees.  She successfully underwent total knee replacement arthroplasty to the right knee in May of 1998, had post-hospitalization rehabilitation and has done quite well after that.  She has had progressive problems concerning her left knee.  She has had intra-articular injections with steroids, which have provided her with some help, but unfortunately, the knee has deteriorated.  X-ray showed severe osteoarthritis of the left knee.  After much discussion and the success of the right knee, it was felt this patient would benefit with surgical intervention, being admitted for total knee replacement arthroplasty of the left knee. Patient has donated two units of autologous blood for this procedure and has been cleared preoperatively by Dr. Eduardo Osier. Harwani.  Dr. Juluis Mire is her family physician.  PAST MEDICAL HISTORY:  The patient had a hysterectomy in 1978, polypectomy in 1967, D&C in 1978, gallbladder in 1984 and right total knee replacement arthroplasty in 1998.  She had a cardiac catheterization in June of 1998, post MI.  She has asthma and osteoarthritis.  CURRENT MEDICATIONS 1. Ventolin inhaler two puffs four times a day. 2. Provera one tab daily on days 16 through 25. 3. Singulair one tab at bedtime. 4. Avapro tablet q.d. 5. Trifluoperazine one tab q.d. 6. Tiazac one cap in the morning. 7. Celebrex 200 mg q.d. 8. Estinyl tab, days 1 through 25.  ALLERGIES:  She is allergic to  PENICILLIN.  SOCIAL HISTORY:  The patient is married and neither smokes nor drinks.  FAMILY HISTORY:  Family history is positive for the father with cancer, dying in 33, and the mother died in 57.  REVIEW OF SYSTEMS:  CNS:  No seizure disorder, paralysis or double vision. RESPIRATORY:  The patient does have asthma and developed asthmatic bronchitis, has difficulty breathing but is controlled with her inhalants. CARDIOVASCULAR:  The patient has had no recent chest pain or orthopnea. GASTROINTESTINAL:  No nausea, vomiting, melena or bloody stools. GENITOURINARY:  No discharge, dysuria or hematuria.  MUSCULOSKELETAL: Primarily as in present illness.  PHYSICAL EXAMINATION  GENERAL:  Alert, cooperative and friendly 77 year old black female whose vital signs are blood pressure 140/84, pulse 76, respirations are 12.  HEENT:  Wears glasses.  PERRLA.  Oropharynx is clear.  Uvula is not present. No lesions.  NECK:  Supple.  CHEST:  Clear to auscultation.  No rhonchi or rales.  No wheezes.  HEART:  Regular rate and rhythm.  No murmurs are heard.  ABDOMEN:  Soft, obese.  Liver and spleen not felt.  GENITALIA/PELVIC:  Not done, not pertinent to present illness.  RECTAL:  Not done, not pertinent to present illness.  BREASTS:  Not done, not pertinent to present illness.  EXTREMITIES:  The patient has pain and crepitus with range of motion of the left knee with some valgus deformity.  Neurovascular is intact to the left lower extremity.  IMPRESSION  1. Osteoarthritis, left knee. 2. Asthma. 3. Hypertension. 4. Remote history of myocardial infarction.  PLAN:  The patient will be admitted for total knee replacement arthroplasty to the left knee.  She has donated two units of autologous blood for this procedure and we plan to have inpatient [facility] rehabilitation after her regular hospitalization.  Should we have any medical problems, we will certainly contact her medical physicians to  follow along with [place] during this patients hospitalization. DD:  03/27/00 TD:  03/28/00 Job: [id number]

## 2010-12-28 NOTE — Op Note (Signed)
[facility]  Patient:    [name], [name]                  MRN: [id number] Proc. Date: 04/04/00 Adm. Date:  [id number] Attending:  Montez Morita, Philips John                           Operative Report  PREOPERATIVE DIAGNOSIS:  Degenerative arthritis of the left knee.  POSTOPERATIVE DIAGNOSIS:  Degenerative arthritis of the left knee.  PROCEDURE:  Total knee replacement arthroplasty using an [employer]onics Scorpio system size 5 femur and tibia with a 10 mm insert with the posterior cruciate retaining component with a 26 patella.  SURGEON:  Philips J. Montez Morita, M.D.  ASSISTANT:  Kerrin Champagne, M.D.  DESCRIPTION OF PROCEDURE:  After suitable general anesthesia, the left knee was prepped and draped routinely, and after exsanguination, an upper thigh tourniquet was inflated to 350 mmHg.  The knee did tend to go into considerable hyperextension, so preoperatively I had elected to do an 8 degree distal cut off the femur, posterior cruciate retaining Scorpio prosthesis if the posterior cruciate looked decent, and adequate 0 degree cut on the tibia. The posterior cruciate retaining prosthesis has a built in 4 degree slope. After routine prep, drape, and exsanguination had been performed, a midline incision was made in the medial perapatella with routine release of the medial collateral, and the significant arthritic change on the medial side of the joint was noted.  A femoral guide was placed in the femoral canal, and 8 cm of the distal femur were removed with a 5 degree valgus cut.  A size 5 was thought to the ideal size sizing jig, and the anterior and posterior chamfering cuts were made with the five jig.  The tibial spines were released, the menisci excised, and the tibia sublux followed.  The ACL was removed, and a size 5 tibial tray was thought to be ideal.  Centering hole was made, followed by enlarging hole, and 10 mm cut was taken off the lateral  tibial plateau using the tibial jig with a 0 degree cut.  The backs of the femurs were cleared of debris, and a trial reduction was carried out with a 10 tray and a 12 tray, and it was noted that with the 12, she tended to open like a book, and it would not glide underneath.  Feeling that that tightness was due to the posterior cruciate or perhaps to the lack of a slope cut into the tibia; however, with the 10 tray it seemed to work quite well.  There was a little give to the medial side.  An extensive medial release had been done with the 10 in place initially, thinking we could go to a 12 and once that was held firm, it seemed to stable up, and it looked like the 10 tray was the best, so we elected to do that.  We then cut the patella with a 26 recessed patella and then marked and cut the slots for the tibial component.  The wound was thoroughly irrigated and cleansed while cement was mixed, and then the components were cemented in the usual fashion.  Following that, old cement was removed from the back of posterior runners.  A second trial was carried out with both the 10 and the 12, and we elected to go with the 10, and this was inserted, 10 mm thick poly tray inside of the size  5 total knee standard tibial tray.  A rotator cuff quick anchor plus was inserted into the medial side to reattach the medial collateral, and then routine wound closure was accomplished.  At the end, 20 cc of 0.5% Marcaine in the knee joint and 10 cc in the subcutaneous and around the incision.  Nice compressive dressing and postoperative to recovery in good condition.  A lateral release was not thought to be necessary as the patella stayed nicely in joint. DD:  04/04/00 TD:  04/06/00 Job: [id number]

## 2010-12-28 NOTE — Discharge Summary (Signed)
[facility]  Patient:    [name]                  MRN: [id number] Adm. Date:  [id number] Disc. Date: 04/09/00 Attending:  Montez Morita, Philips John Dictator:   Grayland Jack, P.A.                           Discharge Summary  ADMISSION DIAGNOSES: 1. End-stage osteoarthritis of the left knee. 2. Asthma. 3. Hypertension. 4. History of myocardial infarction.  DISCHARGE DIAGNOSES: 1. Status post left total knee arthroplasty. 2. Asthma. 3. Hypertension. 4. History of myocardial infarction.  PROCEDURES: Left total knee arthroplasty; surgeon, Philips J. Montez Morita, M.D.; assistant, Kerrin Champagne, M.D.  CONSULTATIONS:  Physical medicine rehabilitation.  BRIEF HISTORY:  The patient is a 77 year old female with longstanding history of left knee pain.  She successfully underwent total knee arthroplasty in May 1998.  She has done extremely well with that.  She unfortunately continued to have increasing symptoms in her left knee.  X-rays showed severe osteoarthritis of the left knee.  It was felt that, due to her limiting factors and positive x-ray findings, that she would best benefit from surgical intervention at this time.  HOSPITAL COURSE:  On April 04, 2000, the patient underwent the above-stated procedure which she tolerated well without complications.  Physical medicine was consulted postoperatively since the patient did stay for in-house rehabilitation following her right total knee replacement.  Physical therapy was initiated postoperatively for total knee protocol.  Coumadin was initiated postoperatively for DVT prophylaxis.  The patient had no postoperative complications.  She progressed extremely well with physical therapy.  On April 09, 2000, the patient was without complaints of nausea or vomiting. Pain was tolerable with oral analgesics.  On exam, she was afebrile, and her vital signs were stable.  Dressings were dry.  Incision site was  clean and dry without no erythema or discharge.  Her calf was soft and nontender.  She was neurovascularly intact.  It was felt the patient was stable for discharge to home at this time with home health physical therapy.  LABORATORY DATA:  Preoperative UA was within normal limits.  Repeat urinalysis revealed color red, appearance cloudy, large hemoglobin, small leukocytes esterase, 0 to 5 white blood cells, many bacteria.  PT/INR prior to admission were within normal limits.  The patient was placed on Coumadin therapy per pharmacy protocol.  Prior to discharge, the patient was therapeutic on her Coumadin with PT being 19.8 and INR 2.3.  Routine chemistries prior to discharge were within normal limits.  Postoperatively, the patient remained relatively stable.  Sodium fell to a low of 134, and glucose was elevated at 145.  CBC prior to admission, hemoglobin was already slightly decreased at 10.7, hematocrit 31.2.  The patient remained stable postoperatively, and prior to discharge, the patients hemoglobin was 10.1, hematocrit 30.0.  Preoperative x-ray of the left knee showed findings consistent with significant osteoarthritis with severe medial joint space narrowing.  Preoperative chest x-ray showed hyperinflated lungs, otherwise normal chest x-ray.  Preoperative EKG showed normal sinus rhythm.  CONDITION ON DISCHARGE:  Improved and stable.  DISCHARGE PLAN:  The patient is being discharged home with her husband. She will have home health physical therapy as well as home health nursing for blood draws while on Coumadin therapy.  She is to shower or bathe ad lib.  She will need daily dry dressing changes to the left  knee.  She is to resume home diet and home medications.  DISCHARGE MEDICATIONS: 1. OxyContin 10 mg 1 tablet p.o. q.12h. 2. OxyIR 5 mg 1 tablet p.o. q.4-6h. p.r.n. breakthrough pain. 3. Skelaxin 400 mg 2 tablets p.o. q.6-8h. p.r.n. spasms. 4. Coumadin per pharmacy  protocol.  FOLLOWUP:  The patient is to return to the clinic in 10 days to follow up with Dr. Montez Morita.  Please call [phone] for appointment, questions, or concerns. DD:  04/09/00 TD:  04/09/00 Job: [id number]

## 2011-02-02 ENCOUNTER — Emergency Department (HOSPITAL_COMMUNITY): Payer: Medicare Other

## 2011-02-02 ENCOUNTER — Observation Stay (HOSPITAL_COMMUNITY)
Admission: EM | Admit: 2011-02-02 | Discharge: 2011-02-05 | Disposition: A | Discharge status: Nursing Home (Includes ICF) | Payer: Medicare Other | Source: Ambulatory Visit | Attending: Internal Medicine | Admitting: Internal Medicine

## 2011-02-02 DIAGNOSIS — K219 Gastro-esophageal reflux disease without esophagitis: Secondary | ICD-10-CM | POA: Insufficient documentation

## 2011-02-02 DIAGNOSIS — Z7982 Long term (current) use of aspirin: Secondary | ICD-10-CM | POA: Insufficient documentation

## 2011-02-02 DIAGNOSIS — F0280 Dementia in other diseases classified elsewhere without behavioral disturbance: Secondary | ICD-10-CM | POA: Insufficient documentation

## 2011-02-02 DIAGNOSIS — Z66 Do not resuscitate: Secondary | ICD-10-CM | POA: Insufficient documentation

## 2011-02-02 DIAGNOSIS — F0281 Dementia in other diseases classified elsewhere with behavioral disturbance: Secondary | ICD-10-CM | POA: Insufficient documentation

## 2011-02-02 DIAGNOSIS — K5289 Other specified noninfective gastroenteritis and colitis: Principal | ICD-10-CM | POA: Insufficient documentation

## 2011-02-02 DIAGNOSIS — Z79899 Other long term (current) drug therapy: Secondary | ICD-10-CM | POA: Insufficient documentation

## 2011-02-02 DIAGNOSIS — G309 Alzheimer's disease, unspecified: Secondary | ICD-10-CM | POA: Insufficient documentation

## 2011-02-02 DIAGNOSIS — F02818 Dementia in other diseases classified elsewhere, unspecified severity, with other behavioral disturbance: Secondary | ICD-10-CM | POA: Insufficient documentation

## 2011-02-02 DIAGNOSIS — I4891 Unspecified atrial fibrillation: Secondary | ICD-10-CM | POA: Insufficient documentation

## 2011-02-02 DIAGNOSIS — G40802 Other epilepsy, not intractable, without status epilepticus: Secondary | ICD-10-CM | POA: Insufficient documentation

## 2011-02-02 DIAGNOSIS — E785 Hyperlipidemia, unspecified: Secondary | ICD-10-CM | POA: Insufficient documentation

## 2011-02-02 DIAGNOSIS — Z96659 Presence of unspecified artificial knee joint: Secondary | ICD-10-CM | POA: Insufficient documentation

## 2011-02-02 DIAGNOSIS — Z96649 Presence of unspecified artificial hip joint: Secondary | ICD-10-CM | POA: Insufficient documentation

## 2011-02-02 DIAGNOSIS — R680 Hypothermia, not associated with low environmental temperature: Secondary | ICD-10-CM | POA: Insufficient documentation

## 2011-02-02 DIAGNOSIS — I10 Essential (primary) hypertension: Secondary | ICD-10-CM | POA: Insufficient documentation

## 2011-02-02 DIAGNOSIS — J45909 Unspecified asthma, uncomplicated: Secondary | ICD-10-CM | POA: Insufficient documentation

## 2011-02-02 DIAGNOSIS — E876 Hypokalemia: Secondary | ICD-10-CM | POA: Insufficient documentation

## 2011-02-02 LAB — COMPREHENSIVE METABOLIC PANEL
ALT: 21 U/L (ref 0–35)
Albumin: 4.1 g/dL (ref 3.5–5.2)
Alkaline Phosphatase: 80 U/L (ref 39–117)
Potassium: 3.1 mEq/L — ABNORMAL LOW (ref 3.5–5.1)
Sodium: 135 mEq/L (ref 135–145)
Total Protein: 8.4 g/dL — ABNORMAL HIGH (ref 6.0–8.3)

## 2011-02-02 LAB — URINALYSIS, ROUTINE W REFLEX MICROSCOPIC
Bilirubin Urine: NEGATIVE
Glucose, UA: 250 mg/dL — AB
Ketones, ur: NEGATIVE mg/dL
Leukocytes, UA: NEGATIVE
pH: 7.5 (ref 5.0–8.0)

## 2011-02-02 LAB — CBC
Hemoglobin: 13.2 g/dL (ref 12.0–15.0)
RBC: 4.26 MIL/uL (ref 3.87–5.11)

## 2011-02-02 LAB — DIFFERENTIAL
Basophils Absolute: 0 10*3/uL (ref 0.0–0.1)
Basophils Relative: 0 % (ref 0–1)
Monocytes Relative: 2 % — ABNORMAL LOW (ref 3–12)
Neutro Abs: 12.1 10*3/uL — ABNORMAL HIGH (ref 1.7–7.7)
Neutrophils Relative %: 90 % — ABNORMAL HIGH (ref 43–77)

## 2011-02-02 LAB — CLOSTRIDIUM DIFFICILE BY PCR: Toxigenic C. Difficile by PCR: NEGATIVE

## 2011-02-02 LAB — PROCALCITONIN: Procalcitonin: <0.1 ng/mL

## 2011-02-03 LAB — CBC
MCH: 30.2 pg (ref 26.0–34.0)
MCHC: 33.9 g/dL (ref 30.0–36.0)
RDW: 13.1 % (ref 11.5–15.5)

## 2011-02-03 LAB — PROCALCITONIN: Procalcitonin: <0.1 ng/mL

## 2011-02-03 LAB — BASIC METABOLIC PANEL
BUN: 18 mg/dL (ref 6–23)
Calcium: 8.6 mg/dL (ref 8.4–10.5)
GFR calc Af Amer: >60 mL/min (ref >60)
GFR calc non Af Amer: 55 mL/min — ABNORMAL LOW (ref >60)
Potassium: 3.4 mEq/L — ABNORMAL LOW (ref 3.5–5.1)
Sodium: 137 mEq/L (ref 135–145)

## 2011-02-03 LAB — LACTIC ACID, PLASMA: Lactic Acid, Venous: 2.4 mmol/L — ABNORMAL HIGH (ref 0.5–2.2)

## 2011-02-04 LAB — URINE CULTURE: Culture: NO GROWTH

## 2011-02-04 LAB — CBC
MCH: 29.8 pg (ref 26.0–34.0)
Platelets: 167 10*3/uL (ref 150–400)
RBC: 3.52 MIL/uL — ABNORMAL LOW (ref 3.87–5.11)
RDW: 13.2 % (ref 11.5–15.5)
WBC: 8.8 10*3/uL (ref 4.0–10.5)

## 2011-02-04 LAB — BASIC METABOLIC PANEL
CO2: 28 mEq/L (ref 19–32)
Calcium: 8.6 mg/dL (ref 8.4–10.5)
Creatinine, Ser: 0.98 mg/dL (ref 0.50–1.10)
GFR calc Af Amer: >60 mL/min (ref >60)
Sodium: 138 mEq/L (ref 135–145)

## 2011-02-05 LAB — BASIC METABOLIC PANEL
BUN: 19 mg/dL (ref 6–23)
CO2: 28 mEq/L (ref 19–32)
Calcium: 8.5 mg/dL (ref 8.4–10.5)
Chloride: 106 mEq/L (ref 96–112)
Creatinine, Ser: 1.07 mg/dL (ref 0.50–1.10)
GFR calc Af Amer: >60 mL/min (ref >60)
GFR calc non Af Amer: 50 mL/min — ABNORMAL LOW (ref >60)
Glucose, Bld: 81 mg/dL (ref 70–99)
Potassium: 3.9 mEq/L (ref 3.5–5.1)
Sodium: 140 mEq/L (ref 135–145)

## 2011-02-05 LAB — CBC
HCT: 28.9 % — ABNORMAL LOW (ref 36.0–46.0)
Hemoglobin: 9.8 g/dL — ABNORMAL LOW (ref 12.0–15.0)
MCH: 30.4 pg (ref 26.0–34.0)
MCHC: 33.9 g/dL (ref 30.0–36.0)
MCV: 89.8 fL (ref 78.0–100.0)
RDW: 13.5 % (ref 11.5–15.5)

## 2011-02-09 LAB — CULTURE, BLOOD (ROUTINE X 2)
Culture  Setup Time: 201206240216
Culture: NO GROWTH

## 2011-02-14 NOTE — Discharge Summary (Signed)
[name], [name]             ACCOUNT NO.:  [id number]  MEDICAL RECORD NO.:  [id number]  LOCATION:  4501                         FACILITY:  [facility]  PHYSICIAN:  Thad Ranger, MD       DATE OF BIRTH:  25-Feb-1931  DATE OF ADMISSION:  02/02/2011 DATE OF DISCHARGE:                              DISCHARGE SUMMARY   PRIMARY CARE PHYSICIAN:  Florentina Jenny, MD  DISCHARGE DIAGNOSES: 1. Acute gastroenteritis, resolved. 2. Hypokalemia, secondary to diarrhea, resolved. 3. Hypertension. 4. History of seizure disorder. 5. Alzheimer dementia with sundowning. 6. History of atrial fibrillation, not on Coumadin secondary to fall     risk. 7. Dyslipidemia.  DISCHARGE MEDICATIONS: 1. Ciprofloxacin 250 mg p.o. b.i.d. for 5 days. 2. Metronidazole 500 mg p.o. b.i.d. for 5 days. 3. Albuterol inhaler two puffs inhaled b.i.d. and q.4 h. p.r.n. for     wheezing or shortness of breath. 4. Diltiazem CD 360 mg p.o. daily. 5. Aspirin 81 mg p.o. every other day. 6. Clonidine 0.1 mg half tablet p.o. t.i.d. 7. Crestor 10 mg p.o. daily. 8. Divalproex sprinkles 125 mg 4 capsules p.o. b.i.d. 9. Docusate 100 mg 1 capsule p.o. b.i.d. only if constipated for more     than 24 hours as needed. 10.Donepezil 10 mg p.o. daily at bedtime. 11.Ferrous sulfate 325 mg p.o. b.i.d. 12.Folic acid 1 mg p.o. every other day. 13.Lorazepam 0.5 mg daily at bedtime as needed for anxiety. 14.Lorazepam 1 mg p.o. t.i.d. 15.Losartan 100 mg p.o. daily. 16.Mucinex 600 mg p.o. daily. 17.Multivitamin 1 tablet p.o. daily. 18.Namenda 10 mg p.o. b.i.d. 19.Pantoprazole 40 mg p.o. daily. 20.Singulair 10 mg p.o. daily at bedtime. 21.Vitamin D 1000 mg p.o. daily. 22.Zyprexa 2.5 mg p.o. b.i.d.  BRIEF HISTORY OF PRESENT ILLNESS:  At the time of admission, [name] is a 77 year old female who was admitted from [facility], was brought to the emergency department secondary to severe nausea, vomiting, and  diarrhea that started at noon.  On the day of admission, the patient was not reported having any fevers or chills or any melena, hematochezia, or hematemesis.  RADIOLOGICAL DATA:  Chest x-ray two-view on February 02, 2011, no acute cardiopulmonary process.  PERTINENT LAB DIAGNOSTIC DATA:  White count at the time of admission was 13.5, hemoglobin 13.2, hematocrit 38.1, platelets 193.  C. diff was negative.  Procalcitonin less than 0.1.  Lactic acid normal at 2.1.  CMP at the time of admission showed sodium 135, potassium 3.1, BUN 22, creatinine 1.1.  UA negative for any UTI.  TSH 0.954.  Urine culture was negative.  BMET at the time of discharge:  Sodium 140, potassium 3.9, BUN 29, creatinine 1.07.  On February 05, 2011, CBC:  White count 9.2, hemoglobin 9.8, hematocrit 28.9, platelets 170.  BRIEF HOSPITALIZATION COURSE:  [name] is a 77 year old female who was admitted for 23-hour observation for acute gastroenteritis. 1. Acute gastroenteritis with nausea, vomiting, diarrhea, resolved.     The patient was initially placed on clear liquid diet, oral     ciprofloxacin and Flagyl.  Gastroenteritis has completely resolved.     At this time, the patient is tolerating regular diet. 2. Alzheimer dementia with sundowning.  The patient did have some     sundowning during the hospitalization on the night of admission     requiring Haldol.  Subsequently, she remained very stable and she     does have some sundowning which per the daughter usually happens in     a different environment or hospitalizations.  At this point, the     patient is back to her baseline, confirmed by the daughter at     bedside. 3. Hypertension.  During hospitalization, the patient was noted to     have some elevated BP readings, likely worsened at the time of     agitation and combativeness.  Highest blood pressure was 201/87     when the patient was extremely agitated.  Cardizem CD was increased     to 360 mg daily, and  the blood pressure has remained stable after     that.  DISPOSITION:  The patient was evaluated by PT/OT consultation and felt she is safe for discharge back to the assisted living facility.  PHYSICAL EXAMINATION:  VITAL SIGNS:  Temperature 97.6, pulse 67, respirations 20, blood pressure 145/70, O2 sats 97% on room air. GENERAL:  The patient is alert, awake, and at her baseline mental status. CARDIOVASCULAR:  S1 and S2 clear. CHEST:  Clear to auscultation bilaterally. ABDOMEN:  Soft, nontender, nondistended.  Normal bowel sounds. EXTREMITIES:  No cyanosis, clubbing, or edema.  Discharge followup with Dr. Florentina Jenny in next 7 days.  CODE STATUS:  DNR.  DISCHARGE TIME:  35 minutes.     Thad Ranger, MD     RR/MEDQ  D:  02/05/2011  T:  02/05/2011  Job:  [id number]  cc:   Florentina Jenny, MD  Electronically Signed by Andres Labrum Adilson Grafton  on 02/14/2011 01:59:50 PM

## 2011-03-04 NOTE — H&P (Signed)
NAMEKIMIYA, BRUNELLE NO.:  [id number]  MEDICAL RECORD NO.:  [id number]  LOCATION:  4501                         FACILITY:  [facility]  PHYSICIAN:  Della Goo, M.D. DATE OF BIRTH:  May 19, 1931  DATE OF ADMISSION:  02/02/2011 DATE OF DISCHARGE:                             HISTORY & PHYSICAL   PRIMARY CARE PHYSICIAN:  Florentina Jenny, MD  CHIEF COMPLAINT:  Nausea, vomiting, diarrhea.  HISTORY OF PRESENT ILLNESS:  This is a 77 year old female from the [facility] skilled nursing home facility who was brought to the emergency department secondary to severe nausea, vomiting, and diarrhea, which started about noon.  The patient was not reported as having any fevers, chills.  There was no report of the patient having any melena or hematochezia or hematemesis.  The patient is unable to give a history. She has a history of Alzheimer's dementia.  PAST MEDICAL HISTORY:  Significant for, 1. Alzheimer's dementia. 2. Atrial fibrillation. 3. Hypertension. 4. Dyslipidemia. 5. Seizure disorder. 6. Asthma.  MEDICATIONS:  Clonidine, aspirin, Crestor, diltiazem, divalproex, Aricept, ferrous sulfate, folic acid, lorazepam, losartan, Namenda, Protonix, and Zyprexa.  ALLERGIES:  PENICILLIN.  SOCIAL HISTORY:  The patient is a nursing home resident.  She is a nonsmoker, nondrinker.  No history of illicit drug usage.  FAMILY HISTORY:  Unable to obtain.  REVIEW OF SYSTEMS:  Unable to obtain from the patient.  PHYSICAL EXAMINATION FINDINGS:  GENERAL:  This is a 77 year old well- nourished, well-developed elderly African American female who is in no acute distress. VITAL SIGNS:  Temperature 97.8, blood pressure initially 187/89, heart rate 69, respirations 18, O2 sats 100%. HEENT:  Normocephalic, atraumatic.  Pupils equal, round, and reactive to light.  Extraocular movements are intact.  Funduscopic benign.  There is no scleral icterus.  Nares are patent bilaterally.   Oropharynx is clear. She is edentulous with dentures on both palate. NECK:  Supple.  Full range of motion.  No thyromegaly, adenopathy, or jugular venous distention. CARDIOVASCULAR:  Regular rate and rhythm.  Normal S1 and S2.  No murmurs, gallops, or rubs appreciated. LUNGS:  Clear to auscultation bilaterally.  No rales, rhonchi, or wheezes. ABDOMEN:  Positive bowel sounds.  Soft, nontender, and nondistended.  No hepatosplenomegaly. EXTREMITIES:  Without cyanosis, clubbing, or edema. NEUROLOGIC:  The patient is pleasantly confused.  Her cranial nerves are intact.  She is able to move all four extremities.  There are no focal deficits otherwise.  LABORATORY STUDIES:  CBC with white blood cell count of 13.5, hemoglobin 13.2, hematocrit 38.1, MCV 89.4, platelets 193, neutrophils 90%, lymphocytes 8%, lactic acid level 2.1, pro calcitonin less than 0.10. Sodium 135, potassium 3.1, chloride 94, carbon dioxide 28, BUN 22, creatinine 1.10, and glucose 155.  Urinalysis reveals urine glucose of 250, urine hemoglobin moderate, urine microscopic reveals 11-28 red blood cells.  Chest x-ray reveals no acute cardiopulmonary disease process.  Stool studies sent for C. diff PCR was negative.  ASSESSMENT:  A 77 year old female being admitted with, 1. Acute gastroenteritis/nausea, vomiting, diarrhea. 2. Hypothermia. 3. Hypokalemia. 4. Hypertension. 5. Seizure disorder. 6. Dementia.  PLAN:  The patient will be admitted for 23-hour observation to a med surgery area.  Antiemetic therapy has  been ordered along with gentle IV fluids for rehydration.  P.r.n. IV hydralazine has been ordered for elevated blood pressures.  The patient remains on the warming blanket at this time secondary to the hypothermia.  Her regular medications have been reconciled and DVT prophylaxis has been ordered.  The patient's code status is a DNR, which will continue and further workup will ensue pending results the patient's  clinical course and studies.     Della Goo, M.D.     HJ/MEDQ  D:  02/03/2011  T:  02/03/2011  Job:  [id number]  cc:   Florentina Jenny, MD  Electronically Signed by Della Goo M.D. on 03/04/2011 11:19:15 AM

## 2011-05-12 ENCOUNTER — Emergency Department (HOSPITAL_COMMUNITY): Payer: Medicare Other

## 2011-05-12 ENCOUNTER — Inpatient Hospital Stay (HOSPITAL_COMMUNITY)
Admission: EM | Admit: 2011-05-12 | Discharge: 2011-05-15 | DRG: 101 | Disposition: A | Discharge status: Rehabilitation Facility | Payer: Medicare Other | Source: Ambulatory Visit | Attending: Internal Medicine | Admitting: Internal Medicine

## 2011-05-12 DIAGNOSIS — G309 Alzheimer's disease, unspecified: Secondary | ICD-10-CM | POA: Diagnosis present

## 2011-05-12 DIAGNOSIS — N289 Disorder of kidney and ureter, unspecified: Secondary | ICD-10-CM | POA: Diagnosis present

## 2011-05-12 DIAGNOSIS — E785 Hyperlipidemia, unspecified: Secondary | ICD-10-CM | POA: Diagnosis present

## 2011-05-12 DIAGNOSIS — I252 Old myocardial infarction: Secondary | ICD-10-CM

## 2011-05-12 DIAGNOSIS — N390 Urinary tract infection, site not specified: Secondary | ICD-10-CM | POA: Diagnosis present

## 2011-05-12 DIAGNOSIS — D649 Anemia, unspecified: Secondary | ICD-10-CM | POA: Diagnosis present

## 2011-05-12 DIAGNOSIS — I10 Essential (primary) hypertension: Secondary | ICD-10-CM | POA: Diagnosis present

## 2011-05-12 DIAGNOSIS — J45909 Unspecified asthma, uncomplicated: Secondary | ICD-10-CM | POA: Diagnosis present

## 2011-05-12 DIAGNOSIS — I2510 Atherosclerotic heart disease of native coronary artery without angina pectoris: Secondary | ICD-10-CM | POA: Diagnosis present

## 2011-05-12 DIAGNOSIS — I4891 Unspecified atrial fibrillation: Secondary | ICD-10-CM | POA: Diagnosis present

## 2011-05-12 DIAGNOSIS — G40909 Epilepsy, unspecified, not intractable, without status epilepticus: Principal | ICD-10-CM | POA: Diagnosis present

## 2011-05-12 DIAGNOSIS — F0280 Dementia in other diseases classified elsewhere without behavioral disturbance: Secondary | ICD-10-CM | POA: Diagnosis present

## 2011-05-12 DIAGNOSIS — E119 Type 2 diabetes mellitus without complications: Secondary | ICD-10-CM | POA: Diagnosis present

## 2011-05-12 LAB — COMPREHENSIVE METABOLIC PANEL
ALT: 29 U/L (ref 0–35)
Albumin: 3.3 g/dL — ABNORMAL LOW (ref 3.5–5.2)
Alkaline Phosphatase: 72 U/L (ref 39–117)
Chloride: 97 mEq/L (ref 96–112)
Potassium: 4.1 mEq/L (ref 3.5–5.1)
Sodium: 132 mEq/L — ABNORMAL LOW (ref 135–145)
Total Bilirubin: 0.2 mg/dL — ABNORMAL LOW (ref 0.3–1.2)
Total Protein: 6.6 g/dL (ref 6.0–8.3)

## 2011-05-12 LAB — URINALYSIS, ROUTINE W REFLEX MICROSCOPIC
Bilirubin Urine: NEGATIVE
Glucose, UA: NEGATIVE mg/dL
Ketones, ur: NEGATIVE mg/dL
Protein, ur: NEGATIVE mg/dL
pH: 7.5 (ref 5.0–8.0)

## 2011-05-12 LAB — VALPROIC ACID LEVEL: Valproic Acid Lvl: 73.6 ug/mL (ref 50.0–100.0)

## 2011-05-12 LAB — DIFFERENTIAL
Eosinophils Absolute: 0.2 10*3/uL (ref 0.0–0.7)
Eosinophils Relative: 3 % (ref 0–5)
Lymphocytes Relative: 30 % (ref 12–46)
Lymphs Abs: 1.6 10*3/uL (ref 0.7–4.0)
Monocytes Relative: 9 % (ref 3–12)

## 2011-05-12 LAB — GLUCOSE, CAPILLARY

## 2011-05-12 LAB — CBC
HCT: 31.4 % — ABNORMAL LOW (ref 36.0–46.0)
MCH: 30.3 pg (ref 26.0–34.0)
MCV: 90.8 fL (ref 78.0–100.0)
Platelets: 117 10*3/uL — ABNORMAL LOW (ref 150–400)
RBC: 3.46 MIL/uL — ABNORMAL LOW (ref 3.87–5.11)
RDW: 13.8 % (ref 11.5–15.5)
WBC: 5.5 10*3/uL (ref 4.0–10.5)

## 2011-05-12 LAB — CARDIAC PANEL(CRET KIN+CKTOT+MB+TROPI)
Relative Index: 2.6 — ABNORMAL HIGH (ref 0.0–2.5)
Troponin I: <0.3 ng/mL (ref <0.30)

## 2011-05-12 LAB — URINE MICROSCOPIC-ADD ON

## 2011-05-12 LAB — APTT: aPTT: 30 seconds (ref 24–37)

## 2011-05-12 LAB — BASIC METABOLIC PANEL
Calcium: 9.3 mg/dL (ref 8.4–10.5)
Chloride: 99 mEq/L (ref 96–112)
Creatinine, Ser: 1.3 mg/dL — ABNORMAL HIGH (ref 0.50–1.10)
GFR calc Af Amer: 48 mL/min — ABNORMAL LOW (ref >60)
Sodium: 135 mEq/L (ref 135–145)

## 2011-05-12 LAB — AMMONIA: Ammonia: 17 umol/L (ref 11–60)

## 2011-05-12 LAB — POCT I-STAT TROPONIN I: Troponin i, poc: 0.01 ng/mL (ref 0.00–0.08)

## 2011-05-12 LAB — CK: Total CK: 103 U/L (ref 7–177)

## 2011-05-13 ENCOUNTER — Observation Stay (HOSPITAL_COMMUNITY): Payer: Medicare Other

## 2011-05-13 LAB — BASIC METABOLIC PANEL
BUN: 15
BUN: 13
CO2: 30
Calcium: 8.8
Chloride: 100
Chloride: 100
Chloride: 93 — ABNORMAL LOW
Chloride: 101
Creatinine, Ser: 1.43 — ABNORMAL HIGH
GFR calc Af Amer: 43 — ABNORMAL LOW
GFR calc Af Amer: 50 — ABNORMAL LOW
GFR calc Af Amer: 54 — ABNORMAL LOW
GFR calc Af Amer: 50 — ABNORMAL LOW
GFR calc non Af Amer: 41 — ABNORMAL LOW
Glucose, Bld: 107 — ABNORMAL HIGH
Glucose, Bld: 87
Potassium: 3.9
Potassium: 4
Potassium: 3.7
Sodium: 135
Sodium: 136

## 2011-05-13 LAB — DIFFERENTIAL
Basophils Absolute: 0
Basophils Absolute: 0.1
Basophils Absolute: 0.1
Basophils Relative: 0
Basophils Relative: 1
Eosinophils Absolute: 0
Eosinophils Absolute: 0
Eosinophils Relative: 0
Eosinophils Relative: 0
Eosinophils Relative: 0
Lymphocytes Relative: 16
Lymphocytes Relative: 23
Lymphs Abs: 2.1
Monocytes Absolute: 0.5
Monocytes Absolute: 0.6
Monocytes Relative: 8
Neutro Abs: 7.6
Neutrophils Relative %: 70
Neutrophils Relative %: 77

## 2011-05-13 LAB — CBC
HCT: 31.6 — ABNORMAL LOW
HCT: 33.5 — ABNORMAL LOW
HCT: 29 — ABNORMAL LOW
HCT: 29.9 — ABNORMAL LOW
Hemoglobin: 10.7 — ABNORMAL LOW
Hemoglobin: 11.4 — ABNORMAL LOW
Hemoglobin: 12.3
Hemoglobin: 10 — ABNORMAL LOW
MCHC: 32.8 g/dL (ref 30.0–36.0)
MCHC: 33.3
MCHC: 34.3
MCHC: 34
MCV: 86.1
MCV: 88.3
MCV: 88.5
MCV: 88.5
MCV: 88.4
MCV: 88.6
Platelets: 129 10*3/uL — ABNORMAL LOW (ref 150–400)
Platelets: 286
Platelets: 312
Platelets: 327
Platelets: 322
RBC: 3.58 — ABNORMAL LOW
RBC: 3.71 — ABNORMAL LOW
RBC: 3.83 — ABNORMAL LOW
RBC: 4.19
RBC: 3.38 — ABNORMAL LOW
RDW: 14.1 % (ref 11.5–15.5)
RDW: 12.5
RDW: 12.8
RDW: 13.3
WBC: 5.3 10*3/uL (ref 4.0–10.5)
WBC: 10.1
WBC: 10.4
WBC: 9
WBC: 9.5
WBC: 6.7

## 2011-05-13 LAB — URINE CULTURE
Colony Count: NO GROWTH
Culture: NO GROWTH

## 2011-05-13 LAB — COMPREHENSIVE METABOLIC PANEL
ALT: 12
AST: 22
AST: 24
Albumin: 4.3
Alkaline Phosphatase: 62 U/L (ref 39–117)
Alkaline Phosphatase: 59
BUN: 22 mg/dL (ref 6–23)
CO2: 28
Calcium: 8.9 mg/dL (ref 8.4–10.5)
Chloride: 85 — ABNORMAL LOW
Chloride: 89 — ABNORMAL LOW
Creatinine, Ser: 1.28 mg/dL — ABNORMAL HIGH (ref 0.50–1.10)
Creatinine, Ser: 1.75 — ABNORMAL HIGH
GFR calc Af Amer: 45 mL/min — ABNORMAL LOW (ref >90)
GFR calc Af Amer: 34 — ABNORMAL LOW
GFR calc Af Amer: 42 — ABNORMAL LOW
GFR calc non Af Amer: 35 — ABNORMAL LOW
Glucose, Bld: 80 mg/dL (ref 70–99)
Glucose, Bld: 120 — ABNORMAL HIGH
Potassium: 2.7 — CL
Sodium: 126 — ABNORMAL LOW
Sodium: 131 — ABNORMAL LOW
Total Bilirubin: 1.5 — ABNORMAL HIGH
Total Protein: 6 g/dL (ref 6.0–8.3)

## 2011-05-13 LAB — PROTIME-INR: INR: 1

## 2011-05-13 LAB — POCT I-STAT, CHEM 8
Creatinine, Ser: 1.5 — ABNORMAL HIGH
Glucose, Bld: 120 — ABNORMAL HIGH
HCT: 38
Hemoglobin: 12.9
Potassium: 2.8 — ABNORMAL LOW
Sodium: 125 — ABNORMAL LOW
TCO2: 31

## 2011-05-13 LAB — MAGNESIUM
Magnesium: 1.5
Magnesium: 1.5
Magnesium: 1.9

## 2011-05-13 LAB — CULTURE, BLOOD (ROUTINE X 2): Culture: NO GROWTH

## 2011-05-13 LAB — URINE MICROSCOPIC-ADD ON

## 2011-05-13 LAB — GLUCOSE, CAPILLARY
Glucose-Capillary: 87 mg/dL (ref 70–99)
Glucose-Capillary: 100 — ABNORMAL HIGH
Glucose-Capillary: 105 — ABNORMAL HIGH
Glucose-Capillary: 131 — ABNORMAL HIGH
Glucose-Capillary: 135 mg/dL — ABNORMAL HIGH (ref 70–99)

## 2011-05-13 LAB — URINALYSIS, ROUTINE W REFLEX MICROSCOPIC
Glucose, UA: NEGATIVE
Glucose, UA: NEGATIVE
Ketones, ur: NEGATIVE
Leukocytes, UA: NEGATIVE
Protein, ur: NEGATIVE
Urobilinogen, UA: 0.2
pH: 5.5

## 2011-05-13 LAB — CARDIAC PANEL(CRET KIN+CKTOT+MB+TROPI)
CK, MB: 2.3 ng/mL (ref 0.3–4.0)
Relative Index: INVALID (ref 0.0–2.5)
Total CK: 98 U/L (ref 7–177)
Total CK: 83 U/L (ref 7–177)
Troponin I: <0.3 ng/mL (ref <0.30)

## 2011-05-13 LAB — RAPID URINE DRUG SCREEN, HOSP PERFORMED
Amphetamines: NOT DETECTED
Barbiturates: NOT DETECTED
Benzodiazepines: NOT DETECTED
Opiates: NOT DETECTED
Tetrahydrocannabinol: NOT DETECTED

## 2011-05-13 LAB — ETHANOL: Alcohol, Ethyl (B): <5

## 2011-05-13 LAB — HEMOGLOBIN A1C: Mean Plasma Glucose: 114 mg/dL (ref <117)

## 2011-05-13 LAB — SODIUM, URINE, RANDOM: Sodium, Ur: 106

## 2011-05-13 LAB — APTT: aPTT: 24

## 2011-05-14 LAB — GLUCOSE, CAPILLARY: Glucose-Capillary: 112 mg/dL — ABNORMAL HIGH (ref 70–99)

## 2011-05-14 LAB — BASIC METABOLIC PANEL
BUN: 24 mg/dL — ABNORMAL HIGH (ref 6–23)
Chloride: 102 mEq/L (ref 96–112)
GFR calc Af Amer: 47 mL/min — ABNORMAL LOW (ref >90)
Glucose, Bld: 76 mg/dL (ref 70–99)
Potassium: 4 mEq/L (ref 3.5–5.1)
Sodium: 135 mEq/L (ref 135–145)

## 2011-05-14 LAB — CBC
HCT: 27.3 % — ABNORMAL LOW (ref 36.0–46.0)
Hemoglobin: 9 g/dL — ABNORMAL LOW (ref 12.0–15.0)
WBC: 5.7 10*3/uL (ref 4.0–10.5)

## 2011-05-15 LAB — BASIC METABOLIC PANEL
Calcium: 8.5 mg/dL (ref 8.4–10.5)
GFR calc non Af Amer: 38 mL/min — ABNORMAL LOW (ref >90)
Glucose, Bld: 83 mg/dL (ref 70–99)
Sodium: 133 mEq/L — ABNORMAL LOW (ref 135–145)

## 2011-05-15 LAB — GLUCOSE, CAPILLARY: Glucose-Capillary: 110 mg/dL — ABNORMAL HIGH (ref 70–99)

## 2011-05-15 NOTE — Discharge Summary (Signed)
NAMESYNA, GAD NO.:  [id number]  MEDICAL RECORD NO.:  [id number]  LOCATION:  3705                         FACILITY:  [facility]  PHYSICIAN:  Isidor Holts, M.D.  DATE OF BIRTH:  1931/01/24  DATE OF ADMISSION:  05/12/2011 DATE OF DISCHARGE:  05/15/2011                        DISCHARGE SUMMARY - REFERRING   PRIMARY MD:  Florentina Jenny, MD  DISCHARGE DIAGNOSES: 1. Seizure episode. 2. Transient unresponsiveness/bradypnea, secondary to #1. 3. Dehydration/acute renal insufficiency. 4. Urinary tract infection. 5. Alzheimer's dementia. 6. Paroxysmal atrial fibrillation. Not considered Coumadin candidate     secondary to recurrent falls. 7. Hypertension. 8. Bronchial asthma. 9. Dyslipidemia. 10.History of diet-controlled type 2 diabetes mellitus. 11.Chronic anemia. 12.History of coronary artery disease, status post myocardial     infarction in 1998.  DISCHARGE MEDICATIONS: 1. Ciprofloxacin 250 mg p.o. b.i.d. for 4 days only from May 15, 2011. 2. Albuterol inhaler 90 mcg 2 puffs p.r.n. q.4 hourly for wheezing. 3. Albuterol inhaler 2 puffs b.i.d. 4. Aspirin 81 mg p.o. every other day. 5. Crestor 10 mg p.o. daily. 6. Divalproex Sprinkle 500 mg p.o. b.i.d. with meals, sprinkle on     food. 7. Docusate 100 mg p.o. b.i.d. p.r.n. constipation. 8. Donepezil 10 mg p.o. at bedtime. 9. Ferrous sulfate 325 mg p.o. b.i.d. 10.Folic acid 1 mg p.o. every other day. 11.Hydralazine 25 mg p.o. q.i.d. 12.Lorazepam 0.5 mg p.o. p.r.n. at bedtime for anxiety. 13.Lorazepam 1 mg p.o. t.i.d. 14.Losartan 100 mg p.o. daily. 15.Multivitamin OTC one tablet p.o. daily. 16.Protonix 40 mg p.o. daily. 17.Singulair 10 mg p.o. at bedtime. 18.Vitamin D 1000 mg p.o. daily. 19.Zyprexa (2.5 mg) half tablet p.o. b.i.d.  Note:  Diltiazem CD and Namenda have been discontinued.  PROCEDURES: 1. Head CT scan May 12, 2011.  This showed no acute intracranial     abnormality.   There was stable cerebral atrophy and chronic small-     vessel disease. 2. Chest x-ray May 12, 2011.  This showed no active     cardiopulmonary disease. 3. Brain MRI May 13, 2011.  This was a motion degraded exam, but     there was no acute infarct.  There was global atrophic.     Ventricular prominence felt to be related to atrial flutter and     hydrocephalus.  Incidentally noted is a patent cava septum     pellucidum.  There was mild-to-moderate small vessel disease-type     changes, tiny area of blood breakdown products, right frontal lobe     without change.  This may be related to remote ischemia or trauma.     No other areas of intracranial hemorrhage.  Partial opacification     mastoid air cells bilaterally.  Minimal mucosal thickening of the     ethmoid sinus air cells.  CONSULTATIONS:  None.  ADMISSION HISTORY:  See H and P notes of May 12, 2011 dictated by this MD. However, in brief, this is a 77 year old female, with known history of severe Alzheimer dementia, history of chronic atrial fibrillation, not on anticoagulation secondary to fall risk, history of recurrent falls, hypertension, seizure disorder, bronchial asthma, dyslipidemia, diet-controlled type 2 diabetes mellitus, chronic normocytic anemia, GERD,  coronary artery disease status post MI in 1998, status post hysterectomy in 1970, status post polypectomy in 1970 and 1978, status post cholecystectomy, status post bilateral total knee replacements, osteoarthritis, presenting after being found on the floor, unresponsive, in the assisted living facility where she resides.  EMS was called and on arrival found the patient in a wheelchair with respiratory rate of 4 per minute and a CBG of 103.  She was given assisted aspiration in the field, brought to the emergency department where all vitals were found to be normal.  The patient at the time of arrival was fully oriented to person, moving all her limbs.   She was referred to the medical service for admission, further evaluation, investigation, and management.  CLINICAL COURSE: 1. Seizure episode.  The patient presented as described above, and was     already back to her baseline mental status at the time of     evaluation by the admitting [facility].  She was placed on     neuro checks, monitored telemetrically, fall and seizure     precautions were instituted.  The patient had no further episodes     of altered mental status throughout the rest of the course of her     hospitalization.  Head CT scan as well as brain MRI, showed no acute     findings.  Valproic level was found to be 73.6 i.e. within normal     therapeutic limits.  Prolactin level was normal at 7.6.  Total CK     was 103.  The patient was continued on preadmission anticonvulsants     and no further episodes were recorded during the course of this     hospitalization.  2. Dehydration/acute renal insufficiency.  The patient presented with     a BUN of 32, creatinine 1.53 consistent with dehydration/acute     renal insufficiency.  She was managed with intravenous fluid     hydration, and we are pleased to note that as of May 14, 2011,     BUN was improved at 24 with a creatinine of 1.24.  This is likely     the patient's baseline.  Intravenous fluids will be discontinued in     a.m. of May 15, 2011.  3. Urinary tract infection.  The patient did have a positive urinary     sediment, with many bacteria.  She was therefore commenced on     intravenous ciprofloxacin.  However, urine cultures grew 90,000     colonies of multiple bacterial morphotypes.  She has been therefore     transitioned to oral ciprofloxacin to be completed on May 18, 2011.  4. Paroxysmal atrial fibrillation.  The patient has a known history of     atrial fibrillation and is not considered a Coumadin candidate     secondary to recurrent falls and seizure disorder.  She was however,      in sinus rhythm at the time of presentation.  This is paroxysmal     atrial fibrillation, and she remained in sinus rhythm during the     course of her hospitalization.  Of note, cardiac enzymes were     cycled and remained unelevated.  5. Hypertension.  The patient has remained normotensive during the     course of this hospitalization.  She did have an initial bump in BP     to 162/84 on May 13, 2011.  But this responded to reinstatement  of preadmission losartan, and hydralazine.  As of May 14, 2011,     blood pressure was normal at 132/73 mmHg.  We have therefore not     restarted Cardizem CD.  6. Bronchial asthma.  There were no problems referable to this.  The     patient's respiratory status remained stable.  7. Dyslipidemia.  The patient continues on statin treatment.  8. Diet-controlled type 2 diabetes mellitus.  The patient was     normotensive throughout the course of her hospitalization.  Her     hemoglobin A1c was 5.6, so perhaps this diagnosis ought to be     reviewed.  9. History of chronic anemia.  Hemoglobin was stable and satisfactory     and was 9.0 with hematocrit of 27.3 on May 14, 2011.  10.History of coronary artery disease.  The patient had no evidence of     acute coronary syndrome and had no symptoms of chest pain or     shortness of breath, during this hospitalization.  DISPOSITION:  The patient on May 14, 2011, was considered to have achieved clinical stability.  She was evaluated by PT/OT and was considered to require only minimal assistance.  There was some question about whether she had adequate supervision at the assisted living facility where she resides, but I did have a discussion with her husband and daughter and they have assured me that the patient is in the memory unit and is under close observation.  Provided no acute problems arise in the interim, the patient will be discharged on May 15, 2011.  ACTIVITY:  As  tolerated.  Recommended to increase activity slowly.  DIET:  Heart-healthy.  FOLLOW-UP INSTRUCTIONS:  The patient to follow up routinely with her primary MD, Florentina Jenny.     Isidor Holts, M.D.     CO/MEDQ  D:  05/14/2011  T:  05/14/2011  Job:  [id number]  cc:   Florentina Jenny, MD  Electronically Signed by Isidor Holts M.D. on 05/15/2011 10:01:20 AM

## 2011-05-15 NOTE — H&P (Signed)
[name], Lambert NO.:  [id number]  MEDICAL RECORD NO.:  [id number]  LOCATION:  [facility]                         FACILITY:  [facility]  PHYSICIAN:  Isidor Holts, M.D.  DATE OF BIRTH:  1930/09/29  DATE OF ADMISSION:  05/12/2011 DATE OF DISCHARGE:                             HISTORY & PHYSICAL   PRIMARY CARE PHYSICIAN:  Florentina Jenny, MD  CHIEF COMPLAINT:  Altered mental status/unresponsiveness, bradypnea, found on floor.  HISTORY OF PRESENT ILLNESS:  This is a 77 year old female, with known history of advanced dementia and seizure disorder.  The patient is unable to recollect events of earlier today, so history is in the main obtained from ED MD, Dr. Alto Denver and EMS notes, as well as facility documentation.  The patient is a resident of [facility] and she was found today on the floor, unresponsive.  It is not clear how she got there and it is thought that she may have had a seizure episode, although, the event was unwitnessed.  EMS was called and on arrival, found the patient in a wheelchair, with a respiratory rate of 4 per minute and a CBG of 103.  She was given assisted respiration in the field and brought to the emergency department.  On arrival, however in the ED, vital signs were normal, the patient was fully oriented to person and was moving all her limbs.  She was then referred to the medical service for further evaluation, investigation, and management.  PAST MEDICAL HISTORY: 1. Severe Alzheimer dementia. 2. History of atrial fibrillation. Not on anticoagulation, secondary to     fall risk. 3. History of recurrent falls. 4. Hypertension. 5. Seizure disorder. 6. History of bronchial asthma. 7. Dyslipidemia. 8. Diet controlled type 2 diabetes mellitus. 9. Chronic normocytic anemia. 10.GERD. 11.Coronary artery disease status post MI, 1998. 12.Status post hysterectomy, 1978. 13.Status post polypectomy 1970 and 1978. 14.Status  post cholecystectomy. 15.Status post bilateral total knee replacement (right in 12/1996 and left in 03/2000). 16.Osteoarthritis.  ALLERGIES:  PENICILLIN.  MEDICATIONS: 1. Crestor 10 mg p.o. daily. 2. Diltiazem CD ER 360 mg p.o. daily. 3. Divalproex sprinkle 125 mg 4 capsules on food twice daily. 4. Aricept 10 mg p.o. at bedtime. 5. Ferrous sulfate 325 mg p.o. b.i.d. 6. Folic acid 1 mg p.o. every other day. 7. Hydralazine 25 mg p.o. q.i.d. 8. Lorazepam 1 mg p.o. t.i.d. 9. Multi-Day women's vitamin 1 tablet p.o. daily. 10.ProAir HFA inhaler (90 mcg) 2 puffs p.r.n. q.4 hourly for wheezing     and shortness of breath. 11.QC low-dose aspirin 81 mg 1 tablet p.o. alternate days. 12.Singulair 10 mg p.o. at bedtime. 13.Docusate sodium 100 mg p.o. p.r.n. b.i.d. 14.Lorazepam 0.5 mg tablet p.r.n. at bedtime for anxiety between 10     p.m. and 6 a.m.  This medication list is obtained from assisted living facility records and may be updated by clinical pharmacologist in due course.  REVIEW OF SYSTEMS:  Unobtainable at this time, but in the emergency department the patient denies chest pain or shortness of breath.  Denies abdominal pain, vomiting, or diarrhea.  Denies fever or chills.  Denies headache.  SOCIAL HISTORY:  The patient is married.  She is  a resident of assisted living facility.  Nonsmoker and nondrinker, has no history of drug abuse.  FAMILY HISTORY:  Unobtainable from the patient secondary to severe dementia.  PHYSICAL EXAMINATION:  VITAL SIGNS:  Temperature 97.7, pulse 61 per minute regular, respiratory rate 13, BP 115/58 mmHg, pulse oximetry 100% on room air. GENERAL:  The patient did not appear to be in obvious acute distress. At the time of this evaluation, she appeared pleasantly demented, garrulous, alert, communicative, and not short of breath at rest. HEENT:  Mild clinical pallor.  No jaundice.  No conjunctival injection. Throat, visible mucous membranes appear  dry. NECK:  Supple.  JVP not seen.  No palpable lymphadenopathy.  No palpable goiter. CHEST:  Clinically clear to auscultation.  No wheezes or crackles. HEART:  S1 and S2 sounds heard normal and regular.  No murmurs. ABDOMEN:  Full, soft, and nontender.  Open cholecystectomy scar is noted.  There is no palpable organomegaly or palpable masses.  Normal bowel sounds. LOWER EXTREMITIES:  No pitting edema.  Palpable peripheral pulses. MUSCULOSKELETAL:  The patient has generalized osteoarthritic changes. Surgical scars are also noted above both knees. CENTRAL NERVOUS SYSTEM:  No focal neurologic deficits on gross examination.  INVESTIGATIONS:  CBC; WBC 5.5, hemoglobin 10.5, hematocrit 31.4, MCV 90.8, platelets 117, and INR is 0.92.  CBG is 88.  Urinalysis shows wbc 0-2, rbc 0-2, and bacteria many.  Electrolytes, sodium 132, potassium 4.1, chloride 97, CO2 of 28, BUN 32, creatinine 1.53, and glucose 89. LFTs are normal.  NH3 is 17.  Troponin I point-of-care 0.01.  Head CT scan May 12, 2011, shows no acute intracranial findings.  There was stable cerebral atrophy and chronic small-vessel disease.  Chest x- ray on April 22, 2011, shows no active cardiopulmonary disease.  A 12-lead EKG May 12, 2011, shows sinus rhythm regular 59 beats per minute and normal axis.  No acute ischemic changes.  This EKG is unchanged when compared to 12-lead EKG of February 02, 2011.  ASSESSMENT AND PLAN: 1. Altered mental status/transient unresponsiveness.  This was     followed by bradypnea and disorientation, then full recovery     and although there was no history of incontinence or tongue biting,     the patient likely had a seizure episode. The other     differential diagnostic considerations include syncope, which may     be cardiogenic versus vasovagal, although, the latter is quite     unlikely.  We shall therefore admit the patient to the neuro     telemetry unit, do q.4 hourly checks for  now.  Institute seizure     precautions and fall precautions, check total CK levels and     prolactin levels, also check Depakote levels.  Meanwhile, we shall     recommence the patient on preadmission anticonvulsant medications,     albeit intravenously for now.  2. Dehydration/acute renal insufficiency.  As evidenced by elevated     BUN/creatinine ratio. The patient's baseline creatinine was 0.9 to     1.07 on June 2012.  We shall therefore manage with intravenous     fluid hydration utilizing normal saline, and follow renal indices.  3. Urinary tract infection.  The patient does have a positive urinary     sediment.  We shall therefore, start IV ciprofloxacin.  Send off     urine for culture and sensitivities.  4. Dementia.  The patient in the emergency department is pleasantly     demented.  We shall continue preadmission Aricept.  5. History of diet-controlled type 2 diabetes mellitus.  The patient     appears euglycemic at the present time.  We will shall check HbA1c     and at least initially, monitor CBGs q.a.c. and at bedtime.  6. History of gastroesophageal reflux disease.  We shall continue     proton pump inhibitor.  7. History of atrial fibrillation.  The patient is currently in sinus     rhythm.  Likely, she has paroxysmal atrial fibrillation.  It is     clear however, that this is not a candidate for chronic anticoagulation,     secondary to high fall risk.  She has indeed sustained some falls     in the past.  We shall continue low-dose aspirin.  Place on     telemetric monitoring.  Check TSH and cycle cardiac enzymes.  8. History of bronchial asthma.  The patient clearly is not in     exacerbation at the present time.  We shall utilize p.r.n.     bronchodilator nebulizers.  9. Hypertension, this appears controlled.  We shall monitor only for     now and hold preadmission antihypertensive medications, but will     reinstate them, should BP start creeping  up.  Note:  The patient during her last hospitalization, was DNR.  She also presents with out of facility DNR form.  We shall therefore respect this status during the course of this hospitalization.    Further management will depend on clinical course.     Isidor Holts, M.D.     CO/MEDQ  D:  05/12/2011  T:  05/12/2011  Job:  [id number]  cc:   Florentina Jenny, MD  Electronically Signed by Isidor Holts M.D. on 05/15/2011 09:56:56 AM

## 2011-05-19 LAB — CULTURE, BLOOD (ROUTINE X 2): Culture  Setup Time: 201210010128

## 2011-05-25 ENCOUNTER — Emergency Department (HOSPITAL_COMMUNITY)
Admission: EM | Admit: 2011-05-25 | Discharge: 2011-05-25 | Disposition: A | Discharge status: Home / Self Care | Payer: Medicare Other | Attending: Emergency Medicine | Admitting: Emergency Medicine

## 2011-05-25 ENCOUNTER — Emergency Department (HOSPITAL_COMMUNITY): Payer: Medicare Other

## 2011-05-25 DIAGNOSIS — F0280 Dementia in other diseases classified elsewhere without behavioral disturbance: Secondary | ICD-10-CM | POA: Insufficient documentation

## 2011-05-25 DIAGNOSIS — R51 Headache: Secondary | ICD-10-CM | POA: Insufficient documentation

## 2011-05-25 DIAGNOSIS — Y9210 Unspecified residential institution as the place of occurrence of the external cause: Secondary | ICD-10-CM | POA: Insufficient documentation

## 2011-05-25 DIAGNOSIS — J45909 Unspecified asthma, uncomplicated: Secondary | ICD-10-CM | POA: Insufficient documentation

## 2011-05-25 DIAGNOSIS — I10 Essential (primary) hypertension: Secondary | ICD-10-CM | POA: Insufficient documentation

## 2011-05-25 DIAGNOSIS — K219 Gastro-esophageal reflux disease without esophagitis: Secondary | ICD-10-CM | POA: Insufficient documentation

## 2011-05-25 DIAGNOSIS — I4891 Unspecified atrial fibrillation: Secondary | ICD-10-CM | POA: Insufficient documentation

## 2011-05-25 DIAGNOSIS — W1809XA Striking against other object with subsequent fall, initial encounter: Secondary | ICD-10-CM | POA: Insufficient documentation

## 2011-05-25 DIAGNOSIS — G309 Alzheimer's disease, unspecified: Secondary | ICD-10-CM | POA: Insufficient documentation

## 2011-05-25 DIAGNOSIS — M542 Cervicalgia: Secondary | ICD-10-CM | POA: Insufficient documentation

## 2011-05-25 DIAGNOSIS — Z79899 Other long term (current) drug therapy: Secondary | ICD-10-CM | POA: Insufficient documentation

## 2011-05-25 DIAGNOSIS — E785 Hyperlipidemia, unspecified: Secondary | ICD-10-CM | POA: Insufficient documentation

## 2011-05-25 DIAGNOSIS — Z66 Do not resuscitate: Secondary | ICD-10-CM | POA: Insufficient documentation

## 2011-05-25 DIAGNOSIS — G40909 Epilepsy, unspecified, not intractable, without status epilepticus: Secondary | ICD-10-CM | POA: Insufficient documentation

## 2011-05-25 DIAGNOSIS — R609 Edema, unspecified: Secondary | ICD-10-CM | POA: Insufficient documentation

## 2011-06-11 ENCOUNTER — Emergency Department (HOSPITAL_COMMUNITY)
Admission: EM | Admit: 2011-06-11 | Discharge: 2011-06-12 | Disposition: A | Discharge status: Home / Self Care | Payer: Medicare Other | Attending: Emergency Medicine | Admitting: Emergency Medicine

## 2011-06-11 ENCOUNTER — Emergency Department (HOSPITAL_COMMUNITY): Payer: Medicare Other

## 2011-06-11 DIAGNOSIS — Y9210 Unspecified residential institution as the place of occurrence of the external cause: Secondary | ICD-10-CM | POA: Insufficient documentation

## 2011-06-11 DIAGNOSIS — Z79899 Other long term (current) drug therapy: Secondary | ICD-10-CM | POA: Insufficient documentation

## 2011-06-11 DIAGNOSIS — W19XXXA Unspecified fall, initial encounter: Secondary | ICD-10-CM | POA: Insufficient documentation

## 2011-06-11 DIAGNOSIS — I10 Essential (primary) hypertension: Secondary | ICD-10-CM | POA: Insufficient documentation

## 2011-06-11 DIAGNOSIS — I6789 Other cerebrovascular disease: Secondary | ICD-10-CM | POA: Insufficient documentation

## 2011-06-11 DIAGNOSIS — F0280 Dementia in other diseases classified elsewhere without behavioral disturbance: Secondary | ICD-10-CM | POA: Insufficient documentation

## 2011-06-11 DIAGNOSIS — M25559 Pain in unspecified hip: Secondary | ICD-10-CM | POA: Insufficient documentation

## 2011-06-11 DIAGNOSIS — M25519 Pain in unspecified shoulder: Secondary | ICD-10-CM | POA: Insufficient documentation

## 2011-06-11 DIAGNOSIS — G309 Alzheimer's disease, unspecified: Secondary | ICD-10-CM | POA: Insufficient documentation

## 2011-06-11 DIAGNOSIS — T07XXXA Unspecified multiple injuries, initial encounter: Secondary | ICD-10-CM | POA: Insufficient documentation

## 2011-06-11 DIAGNOSIS — G40909 Epilepsy, unspecified, not intractable, without status epilepticus: Secondary | ICD-10-CM | POA: Insufficient documentation

## 2011-06-11 DIAGNOSIS — M542 Cervicalgia: Secondary | ICD-10-CM | POA: Insufficient documentation

## 2011-06-11 LAB — BASIC METABOLIC PANEL
Calcium: 9.4 mg/dL (ref 8.4–10.5)
Creatinine, Ser: 1.23 mg/dL — ABNORMAL HIGH (ref 0.50–1.10)
GFR calc Af Amer: 47 mL/min — ABNORMAL LOW (ref >90)
GFR calc non Af Amer: 41 mL/min — ABNORMAL LOW (ref >90)

## 2011-06-11 LAB — CBC
MCH: 30 pg (ref 26.0–34.0)
MCHC: 33.1 g/dL (ref 30.0–36.0)
MCV: 90.6 fL (ref 78.0–100.0)
Platelets: 133 10*3/uL — ABNORMAL LOW (ref 150–400)
RDW: 13.4 % (ref 11.5–15.5)
WBC: 5.3 10*3/uL (ref 4.0–10.5)

## 2011-06-11 LAB — DIFFERENTIAL
Eosinophils Absolute: 0.1 10*3/uL (ref 0.0–0.7)
Eosinophils Relative: 2 % (ref 0–5)
Lymphs Abs: 1.5 10*3/uL (ref 0.7–4.0)
Monocytes Absolute: 0.5 10*3/uL (ref 0.1–1.0)
Monocytes Relative: 9 % (ref 3–12)

## 2011-06-12 LAB — URINALYSIS, ROUTINE W REFLEX MICROSCOPIC
Bilirubin Urine: NEGATIVE
Ketones, ur: NEGATIVE mg/dL
Leukocytes, UA: NEGATIVE
Nitrite: NEGATIVE
Protein, ur: NEGATIVE mg/dL

## 2011-06-13 LAB — URINE CULTURE: Colony Count: NO GROWTH

## 2011-06-29 ENCOUNTER — Emergency Department (HOSPITAL_COMMUNITY)
Admission: EM | Admit: 2011-06-29 | Discharge: 2011-06-30 | Disposition: A | Discharge status: Home / Self Care | Payer: Medicare Other | Attending: Emergency Medicine | Admitting: Emergency Medicine

## 2011-06-29 DIAGNOSIS — G309 Alzheimer's disease, unspecified: Secondary | ICD-10-CM | POA: Insufficient documentation

## 2011-06-29 DIAGNOSIS — F0280 Dementia in other diseases classified elsewhere without behavioral disturbance: Secondary | ICD-10-CM | POA: Insufficient documentation

## 2011-06-29 DIAGNOSIS — I10 Essential (primary) hypertension: Secondary | ICD-10-CM

## 2011-06-29 HISTORY — DX: Unspecified dementia, unspecified severity, without behavioral disturbance, psychotic disturbance, mood disturbance, and anxiety: F03.90

## 2011-06-29 HISTORY — DX: Alzheimer's disease, unspecified: G30.9

## 2011-06-29 HISTORY — DX: Essential (primary) hypertension: I10

## 2011-06-29 HISTORY — DX: Hypo-osmolality and hyponatremia: E87.1

## 2011-06-29 HISTORY — DX: Pure hypercholesterolemia, unspecified: E78.00

## 2011-06-29 HISTORY — DX: Unspecified convulsions: R56.9

## 2011-06-29 HISTORY — DX: Gastro-esophageal reflux disease without esophagitis: K21.9

## 2011-06-29 HISTORY — DX: Dementia in other diseases classified elsewhere, unspecified severity, without behavioral disturbance, psychotic disturbance, mood disturbance, and anxiety: F02.80

## 2011-06-29 NOTE — ED Notes (Signed)
Pt found wandering out of room.  Able to get pt back to bed.

## 2011-06-29 NOTE — ED Provider Notes (Signed)
Addendum to ED Note for 06/29/11 - Azusena, Erlandson.  Please see the note completed by the mid-level provider for full documentation of this encounter.  This 77 year old female with history of dementia now presents with her nursing home with concerns of hypertension. Per nursing home report her blood pressure was greater than 200 systolic.  She was noted to have any behavioral changes during that time. On exam the patient is in no distress, seemingly comfortably sleeping anything like it. She notes that she feels well, and has no audible physical exam findings, though her dementia is evident.     Medical screening examination/treatment/procedure(s) were performed by non-physician practitioner and as supervising physician I was immediately available for consultation/collaboration.     Gerhard Munch, MD 06/29/11 [phone]

## 2011-06-29 NOTE — ED Notes (Signed)
[id number]<AV> Expected date:06/29/11<BR> Expected time: 7:30 PM<BR> Means of arrival:Ambulance<BR> Comments:<BR> PTAR HTN

## 2011-06-30 LAB — POCT I-STAT, CHEM 8
BUN: 25 mg/dL — ABNORMAL HIGH (ref 6–23)
Sodium: 140 mEq/L (ref 135–145)
TCO2: 27 mmol/L (ref 0–100)

## 2011-06-30 NOTE — ED Provider Notes (Signed)
History     CSN: [id number] Arrival date & time: 06/29/2011  7:58 PM   First MD Initiated Contact with Patient 06/29/11 2053      Chief Complaint  Patient presents with  . Hypertension     HPI: Confused 77 year old female who appears in no acute distress Pt unable to give information due to a level 5 caveat of dementia. Spoke w/ "Windell Moulding" at [facility]/gallery exhibitions officer) who states pt's BP's have been trending up especially in the evenings x 1 week. BP's seem to be normalized in am and at bedtime. Tonight they obtained a BP of 200/114. Pt's care provider was notified and staff was instructed to send pt to ED for eval. Pt is pleasantly confused 77 year old female who appears in no acute distress.   Past Medical History  Diagnosis Date  . Alzheimer disease   . Dementia   . Hypertension   . Hypercholesteremia   . GERD (gastroesophageal reflux disease)   . Hyponatremia   . Seizures   . Asthma     History reviewed. No pertinent past surgical history.  History reviewed. No pertinent family history.  History  Substance Use Topics  . Smoking status: Not on file  . Smokeless tobacco: Not on file  . Alcohol Use: No    OB History    Grav Para Term Preterm Abortions TAB SAB Ect Mult Living                  Review of Systems  Unable to perform ROS Due to level 5 caveat of dementia  Allergies  Penicillins  Home Medications   Current Outpatient Rx  Name Route Sig Dispense Refill  . ALBUTEROL SULFATE HFA 108 (90 BASE) MCG/ACT IN AERS Inhalation Inhale 2 puffs into the lungs 2 (two) times daily.      . ASPIRIN 81 MG PO TABS Oral Take 81 mg by mouth every other day.      Marland Kitchen VITAMIN D 1000 UNITS PO TABS Oral Take 1,000 Units by mouth daily.      Marland Kitchen DIVALPROEX SODIUM 125 MG PO CPSP Oral Take 500 mg by mouth 2 (two) times daily. Sprinkles 4 capsules on food twice a day     . FERROUS SULFATE 325 (65 FE) MG PO TABS Oral Take 325 mg by mouth 2 (two) times daily.     Marland Kitchen FOLIC ACID 1 MG  PO TABS Oral Take 1 mg by mouth every other day.      Marland Kitchen HYDRALAZINE HCL 25 MG PO TABS Oral Take 25 mg by mouth 4 (four) times daily.      Marland Kitchen LORAZEPAM 1 MG PO TABS Oral Take 1 mg by mouth every 8 (eight) hours.      Marland Kitchen LOSARTAN POTASSIUM 100 MG PO TABS Oral Take 100 mg by mouth daily.      Marland Kitchen MONTELUKAST SODIUM 10 MG PO TABS Oral Take 10 mg by mouth at bedtime.      Marygrace Drought WOMENS PO Oral Take 1 tablet by mouth daily at 3 pm.      . OLANZAPINE 2.5 MG PO TABS Oral Take 1.25 mg by mouth 2 (two) times daily.      Marland Kitchen PANTOPRAZOLE SODIUM 40 MG PO TBEC Oral Take 40 mg by mouth daily.      Marland Kitchen ROSUVASTATIN CALCIUM 10 MG PO TABS Oral Take 10 mg by mouth daily.      Marland Kitchen DOCUSATE SODIUM 100 MG PO CAPS Oral Take 100 mg by mouth  2 (two) times daily.      . DONEPEZIL HCL 10 MG PO TABS Oral Take 10 mg by mouth at bedtime as needed.        BP 189/99  Pulse 97  Temp(Src) 98.8 F (37.1 C) (Oral)  SpO2 93%  Physical Exam  Constitutional: She is oriented to person, place, and time. She appears well-developed and well-nourished.  HENT:  Head: Normocephalic and atraumatic.  Eyes: Conjunctivae and EOM are normal. Pupils are equal, round, and reactive to light.  Neck: Normal range of motion. Neck supple.  Cardiovascular: Normal rate and regular rhythm.   Pulmonary/Chest: Effort normal and breath sounds normal.  Abdominal: Soft. Bowel sounds are normal. There is no tenderness.  Musculoskeletal: Normal range of motion.  Neurological: She is alert and oriented to person, place, and time. She has normal reflexes.  Skin: Skin is warm and dry.  Psychiatric: She has a normal mood and affect.       Pleasantly confused but able to follow simple commands.    ED Course  Procedures Discussed pt w/ Dr Jeraldine Loots. Will obtain EKG and some baseline labs and re-assess for likely d/c back to facility. RN reports to Dr Jeraldine Loots that pt became quite combative when staff attempt to get EKG. And not been able to get a urine  sample. Dr Jeraldine Loots has requested pt be d/c'd back to nursing home to f/u w/ PCP there tomorrow.   Labs Reviewed  URINALYSIS, ROUTINE W REFLEX MICROSCOPIC  I-STAT, CHEM 8   No results found.   No diagnosis found.    MDM  Pleasantly confused 77 y/o sent for HTN  which is not new. Staff at [facility] w/ oncern for BP of 200/114 PTA at [facility]. BP here has been 180's over 90's. I-stat 8 shows no significant changes in pt's lab values. Doubt acute process.  Unable to get EKG or U/A due to pt's dementia and lack of cooperation and due to pt's stable appearrance there is no apparent pressing need to pursue further.    there is an and in the     Marathon Oil, NP 06/30/11 0145

## 2011-06-30 NOTE — ED Notes (Signed)
Pt. Refused care for a second time to the charge nurse.

## 2011-06-30 NOTE — Discharge Instructions (Signed)
° °  Chemistry panel done here tonight shows no significant changes in pt's baseline labs. We were unable to obtain EKG or U/A as pt was very combative and uncooperative. Otherwise she was very pleasantly confused through-out her stay in ED and was noted to be in no acute distress. We did not feel it was pressing enough to force her to have these test. Please arrange follow up with her physician as soon as can be arranged.

## 2011-06-30 NOTE — ED Notes (Signed)
Pt was wet,Helped change her Before she left home.

## 2011-07-01 NOTE — ED Provider Notes (Signed)
Medical screening examination/treatment/procedure(s) were conducted as a shared visit with non-physician practitioner(s) and myself.  I personally evaluated the patient during the encounter This elderly F now presents from her NH w concerns (of staff) of persistent HTN.  On exam the patient is in no distress, w no complaints.  Patient discharged back to NH following unremarkable eval.   Gerhard Munch, MD 07/01/11 [phone]

## 2011-07-06 ENCOUNTER — Emergency Department (HOSPITAL_COMMUNITY)
Admission: EM | Admit: 2011-07-06 | Discharge: 2011-07-06 | Disposition: A | Discharge status: Skilled Nursing Facility | Payer: Medicare Other | Attending: Emergency Medicine | Admitting: Emergency Medicine

## 2011-07-06 ENCOUNTER — Encounter (HOSPITAL_COMMUNITY): Payer: Self-pay

## 2011-07-06 DIAGNOSIS — E7800 Pure hypercholesterolemia, unspecified: Secondary | ICD-10-CM | POA: Insufficient documentation

## 2011-07-06 DIAGNOSIS — K219 Gastro-esophageal reflux disease without esophagitis: Secondary | ICD-10-CM | POA: Insufficient documentation

## 2011-07-06 DIAGNOSIS — Z136 Encounter for screening for cardiovascular disorders: Secondary | ICD-10-CM

## 2011-07-06 DIAGNOSIS — G40909 Epilepsy, unspecified, not intractable, without status epilepticus: Secondary | ICD-10-CM | POA: Insufficient documentation

## 2011-07-06 DIAGNOSIS — I10 Essential (primary) hypertension: Secondary | ICD-10-CM | POA: Insufficient documentation

## 2011-07-06 DIAGNOSIS — Z7982 Long term (current) use of aspirin: Secondary | ICD-10-CM | POA: Insufficient documentation

## 2011-07-06 DIAGNOSIS — F0280 Dementia in other diseases classified elsewhere without behavioral disturbance: Secondary | ICD-10-CM | POA: Insufficient documentation

## 2011-07-06 DIAGNOSIS — G309 Alzheimer's disease, unspecified: Secondary | ICD-10-CM | POA: Insufficient documentation

## 2011-07-06 DIAGNOSIS — J45909 Unspecified asthma, uncomplicated: Secondary | ICD-10-CM | POA: Insufficient documentation

## 2011-07-06 DIAGNOSIS — Z79899 Other long term (current) drug therapy: Secondary | ICD-10-CM | POA: Insufficient documentation

## 2011-07-06 NOTE — ED Notes (Signed)
Patient presents from nursing home. When vitals were checked this morning she was hypertensive. Facility had her sent if for evaluation of blood pressure. No acute distress noted. Pt has no complaints. Pt has baseline dementia and is alert to person and place but is confused about time. Breath sounds are clear and bowel sounds are present. Protocols initiated upon arrival to dept.

## 2011-07-06 NOTE — ED Provider Notes (Addendum)
History     CSN: [id number] Arrival date & time: 07/06/2011 11:21 AM   First MD Initiated Contact with Patient 07/06/11 1146      Chief Complaint  Patient presents with  . Hypertension    (Consider location/radiation/quality/duration/timing/severity/associated sxs/prior treatment) HPI Comments: Is sent here from [facility] facility due to elevated blood pressure. The patient is at her baseline demented mental status. The patient has no complaints. Specifically she denies headache, blurred vision, chest pain, shortness of breath, back pain, abdominal pain. She reports she has not eaten breakfast yet this morning and that she is hungry.  The history is provided by the patient.    Past Medical History  Diagnosis Date  . Alzheimer disease   . Dementia   . Hypertension   . Hypercholesteremia   . GERD (gastroesophageal reflux disease)   . Hyponatremia   . Seizures   . Asthma     History reviewed. No pertinent past surgical history.  History reviewed. No pertinent family history.  History  Substance Use Topics  . Smoking status: Never Smoker   . Smokeless tobacco: Never Used  . Alcohol Use: No    OB History    Grav Para Term Preterm Abortions TAB SAB Ect Mult Living                  Review of Systems  Unable to perform ROS: Dementia    Allergies  Penicillins  Home Medications   Current Outpatient Rx  Name Route Sig Dispense Refill  . ALBUTEROL SULFATE HFA 108 (90 BASE) MCG/ACT IN AERS Inhalation Inhale 2 puffs into the lungs 2 (two) times daily.      . ASPIRIN 81 MG PO TABS Oral Take 81 mg by mouth every other day.      Marland Kitchen VITAMIN D 1000 UNITS PO TABS Oral Take 1,000 Units by mouth daily.      Marland Kitchen DIVALPROEX SODIUM 125 MG PO CPSP Oral Take 500 mg by mouth 2 (two) times daily. Sprinkles 4 capsules on food twice a day     . DONEPEZIL HCL 10 MG PO TABS Oral Take 10 mg by mouth at bedtime.     Marland Kitchen FERROUS SULFATE 325 (65 FE) MG PO TABS Oral Take 325 mg by  mouth 2 (two) times daily.     Marland Kitchen FOLIC ACID 1 MG PO TABS Oral Take 1 mg by mouth every other day.      Marland Kitchen HYDRALAZINE HCL 25 MG PO TABS Oral Take 25 mg by mouth 4 (four) times daily.      Marland Kitchen LORAZEPAM 1 MG PO TABS Oral Take 0.5 mg by mouth every 8 (eight) hours.     Marland Kitchen LOSARTAN POTASSIUM 100 MG PO TABS Oral Take 100 mg by mouth daily.      Marland Kitchen METOPROLOL TARTRATE 25 MG PO TABS Oral Take 12.5 mg by mouth at bedtime.      Marland Kitchen MONTELUKAST SODIUM 10 MG PO TABS Oral Take 10 mg by mouth at bedtime.      Marygrace Drought WOMENS PO Oral Take 1 tablet by mouth daily at 3 pm.      . OLANZAPINE 2.5 MG PO TABS Oral Take 1.25 mg by mouth 2 (two) times daily.      Marland Kitchen PANTOPRAZOLE SODIUM 40 MG PO TBEC Oral Take 40 mg by mouth daily.      Marland Kitchen ROSUVASTATIN CALCIUM 10 MG PO TABS Oral Take 10 mg by mouth daily.      Marland Kitchen  DOCUSATE SODIUM 100 MG PO CAPS Oral Take 100 mg by mouth 2 (two) times daily.      Marland Kitchen LOPERAMIDE HCL 2 MG PO CAPS Oral Take 2 mg by mouth 4 (four) times daily as needed. For loose stool       BP 178/89  Pulse 71  Temp(Src) 98.7 F (37.1 C) (Oral)  Resp 20  SpO2 100%  Physical Exam  Nursing note and vitals reviewed. Constitutional: She appears well-developed and well-nourished.  HENT:  Head: Normocephalic and atraumatic.  Eyes: Pupils are equal, round, and reactive to light.  Neck: Normal range of motion. Neck supple.  Cardiovascular: Normal rate and regular rhythm.   Pulmonary/Chest: Effort normal. No respiratory distress. She has no wheezes.  Abdominal: Soft. There is no tenderness. There is no rebound and no guarding.  Musculoskeletal: Normal range of motion.  Neurological: She is alert.  Skin: Skin is warm and dry.  Psychiatric: She has a normal mood and affect.    ED Course  Procedures (including critical care time)  Labs Reviewed - No data to display No results found.   1. Hypertension screen       MDM  I reviewed prior records.  Pt has been sent for similar problems in the past.   I think pt's PCP needs to re-evaluate her BP's.  Since there is no evidence of end organ damage, there is no need for emergent lowering of BP.  Her repeat BP here is in fact 140/68.  She is pleasant, in no distress, afebrile.  Will d/c back to facility.          Gavin Pound. Oletta Lamas, MD 07/06/11 1232  Gavin Pound. Darl Brisbin, MD 07/06/11 1240   In review of her MAR, all of her last doses of medication were refused by the patient.  In addition, she is only on 1 BP medication and taken at night.  My suggestion is that her PCP re-address her BP meds and consider giving a higher dose, or a med that can be given BID which may help keep BP lower throughout the day.  Pt has eaten here, continues to feel "fine"  Despite some elevated BP's here subsequent to the 140/68 reading earlier.    Gavin Pound. Coren Crownover, MD 07/06/11 1447

## 2011-07-06 NOTE — Discharge Instructions (Signed)
 Please follow up with your own physician this week for re-evaluation of your high blood pressure.  Here your blood pressure was 140/85.

## 2011-07-06 NOTE — ED Notes (Signed)
Pt sent in from [facility] facility. When they took her blood pressure it was elevated this morning. No complaints. No issues noted. Pt has baseline dementia and is alert to self and place but is confused about time. She denies any falls. Pt has some pain in the right shoulder with manipulation. No other s/s or issues noted.

## 2011-09-30 ENCOUNTER — Other Ambulatory Visit: Payer: Self-pay | Admitting: Internal Medicine

## 2011-11-17 ENCOUNTER — Emergency Department (HOSPITAL_COMMUNITY)
Admission: EM | Admit: 2011-11-17 | Discharge: 2011-11-17 | Disposition: A | Discharge status: Home / Self Care | Payer: Medicare Other | Attending: Emergency Medicine | Admitting: Emergency Medicine

## 2011-11-17 ENCOUNTER — Encounter (HOSPITAL_COMMUNITY): Payer: Self-pay | Admitting: *Deleted

## 2011-11-17 DIAGNOSIS — F0280 Dementia in other diseases classified elsewhere without behavioral disturbance: Secondary | ICD-10-CM | POA: Insufficient documentation

## 2011-11-17 DIAGNOSIS — G309 Alzheimer's disease, unspecified: Secondary | ICD-10-CM | POA: Insufficient documentation

## 2011-11-17 DIAGNOSIS — K219 Gastro-esophageal reflux disease without esophagitis: Secondary | ICD-10-CM | POA: Insufficient documentation

## 2011-11-17 DIAGNOSIS — R404 Transient alteration of awareness: Secondary | ICD-10-CM | POA: Insufficient documentation

## 2011-11-17 DIAGNOSIS — Z7982 Long term (current) use of aspirin: Secondary | ICD-10-CM | POA: Insufficient documentation

## 2011-11-17 DIAGNOSIS — I10 Essential (primary) hypertension: Secondary | ICD-10-CM | POA: Insufficient documentation

## 2011-11-17 DIAGNOSIS — G40909 Epilepsy, unspecified, not intractable, without status epilepticus: Secondary | ICD-10-CM

## 2011-11-17 DIAGNOSIS — E7800 Pure hypercholesterolemia, unspecified: Secondary | ICD-10-CM | POA: Insufficient documentation

## 2011-11-17 DIAGNOSIS — J45909 Unspecified asthma, uncomplicated: Secondary | ICD-10-CM | POA: Insufficient documentation

## 2011-11-17 DIAGNOSIS — Z79899 Other long term (current) drug therapy: Secondary | ICD-10-CM | POA: Insufficient documentation

## 2011-11-17 LAB — BASIC METABOLIC PANEL
CO2: 30 mEq/L (ref 19–32)
Calcium: 9.3 mg/dL (ref 8.4–10.5)
GFR calc non Af Amer: 27 mL/min — ABNORMAL LOW (ref >90)
Sodium: 134 mEq/L — ABNORMAL LOW (ref 135–145)

## 2011-11-17 LAB — VALPROIC ACID LEVEL: Valproic Acid Lvl: 88 ug/mL (ref 50.0–100.0)

## 2011-11-17 MED ORDER — MORPHINE SULFATE 4 MG/ML IJ SOLN: 4.0000 mg | Freq: Once | INTRAMUSCULAR | Status: DC

## 2011-11-17 MED ORDER — LORAZEPAM 2 MG/ML IJ SOLN
INTRAMUSCULAR | Status: AC
  Filled 2011-11-17: qty 1

## 2011-11-17 NOTE — Discharge Instructions (Signed)
Ms. [place] Depakote level was therapeutic at 65. She has mild renal insufficiency with creatinine of 1.73. Please call her Doctor to notify him of these values.

## 2011-11-17 NOTE — ED Provider Notes (Signed)
History     CSN: [id number]  Arrival date & time 11/17/11  1426   First MD Initiated Contact with Patient 11/17/11 1529      Chief Complaint  Patient presents with  . Seizures    Per EMS pt in from [facility]. staff reports that pt had "seizure like activity for 1 minute." Pt has hx of alzheimers/dementia.     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 Patient had seizure activity jerking motions of her entire body with loss of consciousness lasting 45 seconds to 1 minute at 1:15 PM today. Patient is presently asymptomatic states she feels well alert pleasant and cooperative Past Medical History  Diagnosis Date  . Alzheimer disease   . Dementia   . Hypertension   . Hypercholesteremia   . GERD (gastroesophageal reflux disease)   . Hyponatremia   . Seizures   . Asthma     History reviewed. No pertinent past surgical history.  History reviewed. No pertinent family history.  History  Substance Use Topics  . Smoking status: Never Smoker   . Smokeless tobacco: Never Used  . Alcohol Use: No   DO NOT RESUSCITATE CODE STATUS  OB History    Grav Para Term Preterm Abortions TAB SAB Ect Mult Living                  Review of Systems  Unable to perform ROS: Dementia    Allergies  Penicillins  Home Medications   Current Outpatient Rx  Name Route Sig Dispense Refill  . ALBUTEROL SULFATE HFA 108 (90 BASE) MCG/ACT IN AERS Inhalation Inhale 2 puffs into the lungs 2 (two) times daily.      . ASPIRIN 81 MG PO TABS Oral Take 81 mg by mouth every other day.      Marland Kitchen VITAMIN D 1000 UNITS PO TABS Oral Take 1,000 Units by mouth daily.      Marland Kitchen DIVALPROEX SODIUM 125 MG PO CPSP Oral Take 500 mg by mouth 2 (two) times daily. Sprinkles 4 capsules on food twice a day     .  DOCUSATE SODIUM 100 MG PO CAPS Oral Take 100 mg by mouth 2 (two) times daily.      . DONEPEZIL HCL 10 MG PO TABS Oral Take 10 mg by mouth at bedtime.     Marland Kitchen FERROUS SULFATE 325 (65 FE) MG PO TABS Oral Take 325 mg by mouth 2 (two) times daily.     Marland Kitchen FOLIC ACID 1 MG PO TABS Oral Take 1 mg by mouth every other day.      Marland Kitchen HYDRALAZINE HCL 25 MG PO TABS Oral Take 25 mg by mouth 4 (four) times daily.      Marland Kitchen LOPERAMIDE HCL 2 MG PO CAPS Oral Take 2 mg by mouth 4 (four) times daily as needed. For loose stool     . LORAZEPAM 1 MG PO TABS Oral Take 0.5 mg by mouth every 8 (eight) hours.     Marland Kitchen LOSARTAN POTASSIUM 100 MG PO TABS Oral Take 100 mg by mouth daily.      Marland Kitchen METOPROLOL TARTRATE 25 MG PO TABS Oral Take 12.5 mg by mouth at bedtime.      Marland Kitchen MONTELUKAST SODIUM 10 MG PO TABS Oral Take 10 mg by mouth at bedtime.      Marygrace Drought WOMENS PO Oral Take  1 tablet by mouth daily at 3 pm.      . OLANZAPINE 2.5 MG PO TABS Oral Take 1.25 mg by mouth 2 (two) times daily.      Marland Kitchen PANTOPRAZOLE SODIUM 40 MG PO TBEC Oral Take 40 mg by mouth daily.      Marland Kitchen ROSUVASTATIN CALCIUM 10 MG PO TABS Oral Take 10 mg by mouth daily.        BP 152/74  Pulse 73  Temp(Src) 98.6 F (37 C) (Oral)  Resp 20  SpO2 99%  Physical Exam  Nursing note and vitals reviewed. Constitutional: She appears well-developed and well-nourished.  HENT:  Head: Normocephalic and atraumatic.  Eyes: Conjunctivae are normal. Pupils are equal, round, and reactive to light.  Neck: Neck supple. No tracheal deviation present. No thyromegaly present.  Cardiovascular: Normal rate and regular rhythm.   No murmur heard. Pulmonary/Chest: Effort normal and breath sounds normal.  Abdominal: Soft. Bowel sounds are normal. She exhibits no distension. There is no tenderness.  Musculoskeletal: Normal range of motion. She exhibits no edema and no tenderness.  Neurological: She is alert. No cranial nerve deficit. Coordination normal.       Moves all extremities  follow simple commands, talkative alert pleasant  Skin: Skin is warm and dry. No rash noted.  Psychiatric: She has a normal mood and affect.    ED Course  Procedures (including critical care time)  Labs Reviewed - No data to display No results found. 5:35 PM patient alert ambulates without difficulty  No diagnosis found.   Results for orders placed during the hospital encounter of 11/17/11  VALPROIC ACID LEVEL      Component Value Range   Valproic Acid Lvl 88.0  50.0 - 100.0 (ug/mL)  BASIC METABOLIC PANEL      Component Value Range   Sodium 134 (*) 135 - 145 (mEq/L)   Potassium 4.5  3.5 - 5.1 (mEq/L)   Chloride 96  96 - 112 (mEq/L)   CO2 30  19 - 32 (mEq/L)   Glucose, Bld 125 (*) 70 - 99 (mg/dL)   BUN 29 (*) 6 - 23 (mg/dL)   Creatinine, Ser 1.61 (*) 0.50 - 1.10 (mg/dL)   Calcium 9.3  8.4 - 09.6 (mg/dL)   GFR calc non Af Amer 27 (*) >90 (mL/min)   GFR calc Af Amer 31 (*) >90 (mL/min)   No results found.  MDM  Plan return to nursing home Diagnosis#1 seizure disorder #2 renal insuffiency         Doug Sou, MD 11/17/11 [phone]

## 2011-11-17 NOTE — ED Notes (Signed)
Dr. Ethelda Chick did not want pt to have an orthopaedic consult

## 2011-11-17 NOTE — ED Notes (Signed)
[facility] contacted to transport pt back to [facility].

## 2012-05-18 ENCOUNTER — Other Ambulatory Visit: Payer: Medicare Other

## 2012-05-18 ENCOUNTER — Other Ambulatory Visit: Payer: Self-pay | Admitting: Neurology

## 2012-05-18 DIAGNOSIS — R42 Dizziness and giddiness: Secondary | ICD-10-CM

## 2012-05-25 ENCOUNTER — Encounter: Payer: Self-pay | Admitting: Gastroenterology

## 2012-08-17 IMAGING — CT CT CERVICAL SPINE W/O CM
4 of 6 series · 12 of 33 positions shown, 14 images · non-contrast
Comparison: MRI brain dated 05/13/2011

CT HEAD

CLINICAL DATA: Head injury, fall, headache/neck pain, Alzheimer's
disease

CT HEAD WITHOUT CONTRAST
CT CERVICAL SPINE WITHOUT CONTRAST
TECHNIQUE: Multidetector CT imaging of the head and cervical spine
was performed following the standard protocol without intravenous
contrast.  Multiplanar CT image reconstructions of the cervical
spine were also generated.

[Series 3: recon 2: brain · axial · 0.47mm/px · z∈[+49,+105]mm · 2 of 72 slices shown]
[im 24/72  bone]
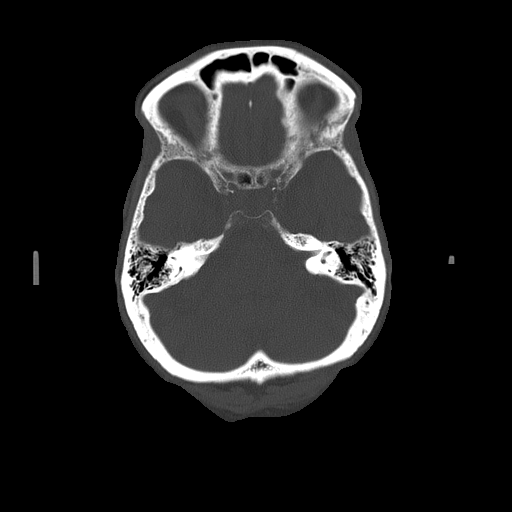
[im 48/72  bone]
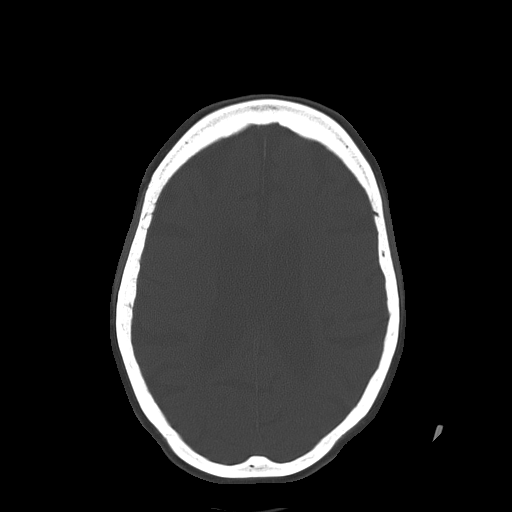

[Series 600: cor · coronal · 0.30mm/px · 3 of 43 slices shown]
[im 9/43  bone]
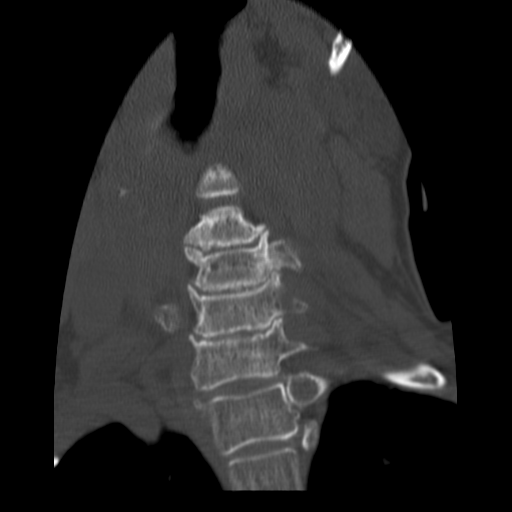
[im 17/43  bone]
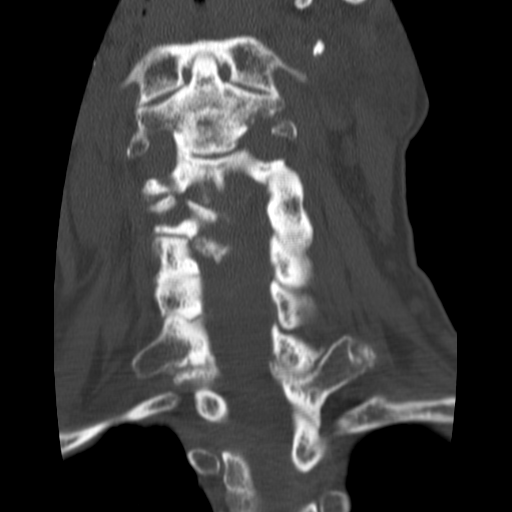
[im 26/43  bone]
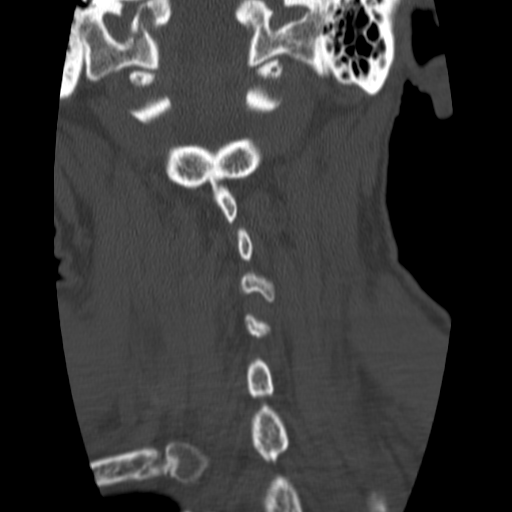

[Series 601: ortho · axial · 0.30mm/px · z∈[-138,-94]mm · 2 of 75 slices shown, 3 images]
[im 25/75  soft-tissue]
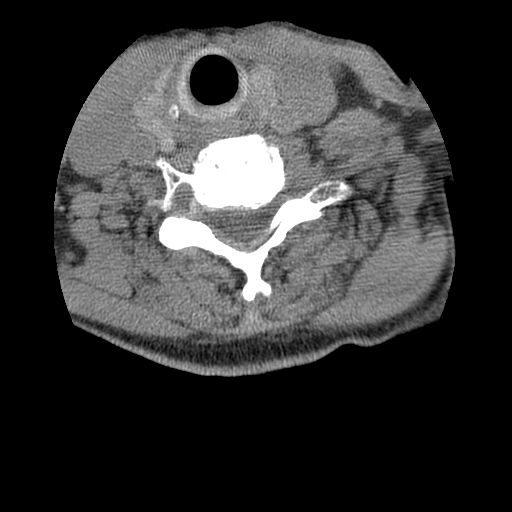
[im 25/75  bone]
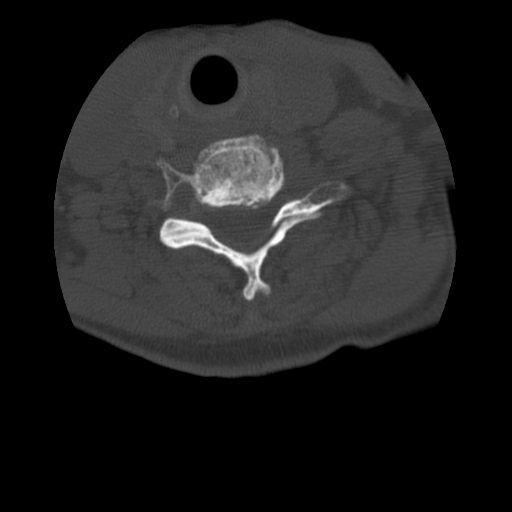
[im 50/75  bone]
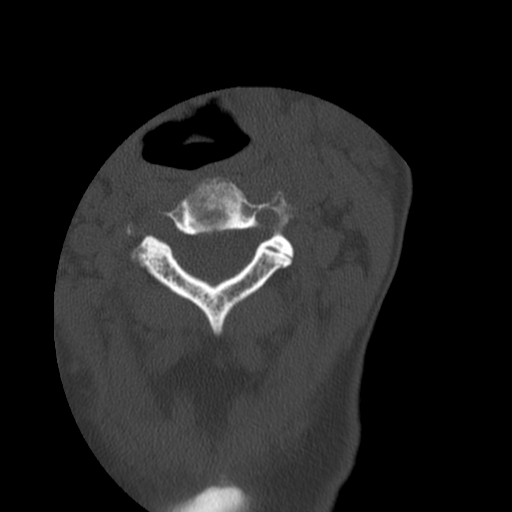

[Series 602: sag · sagittal · 0.30mm/px · 5 of 44 slices shown, 6 images]
[im 15/44  bone]
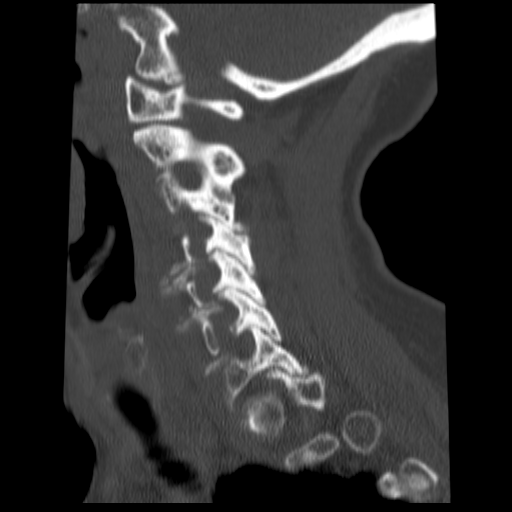
[im 18/44  bone]
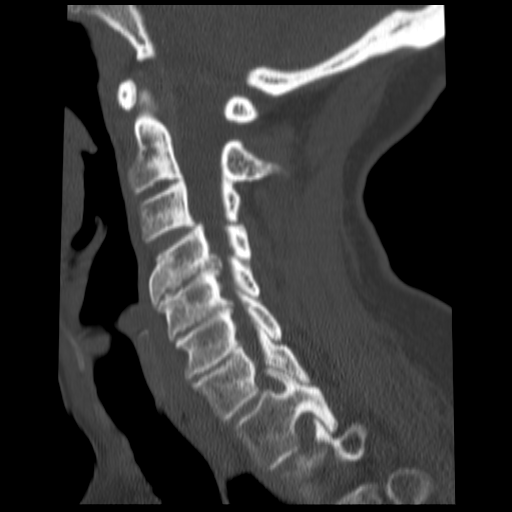
[im 22/44  soft-tissue]
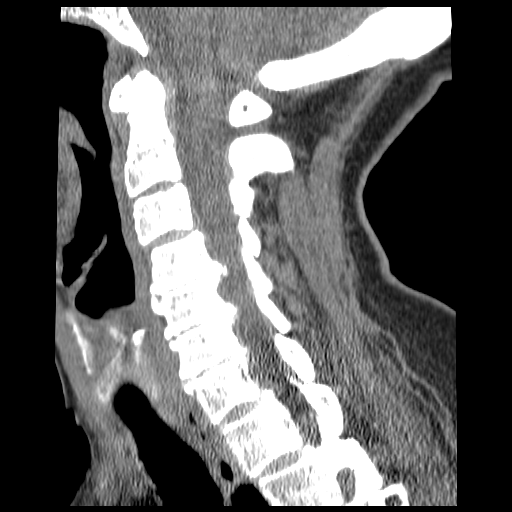
[im 22/44  bone]
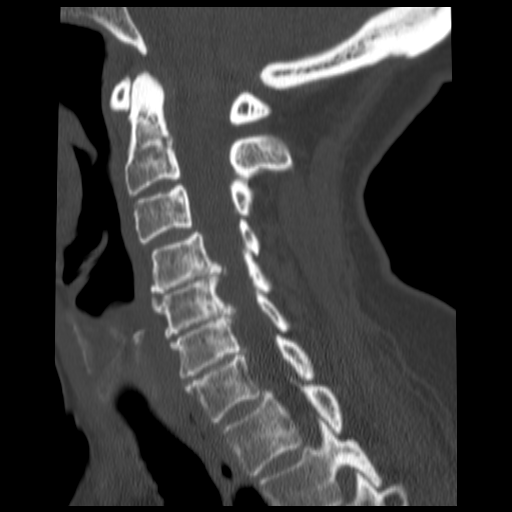
[im 26/44  bone]
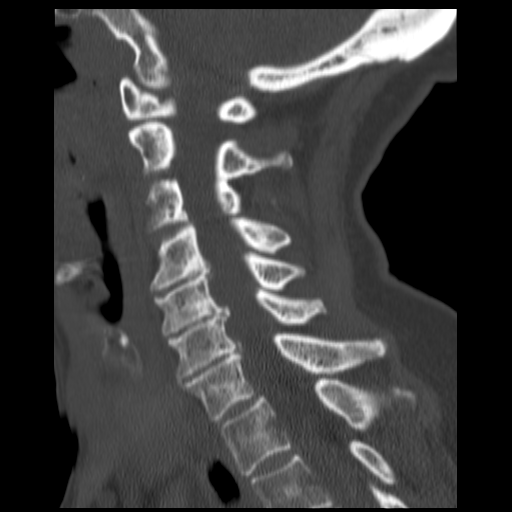
[im 29/44  bone]
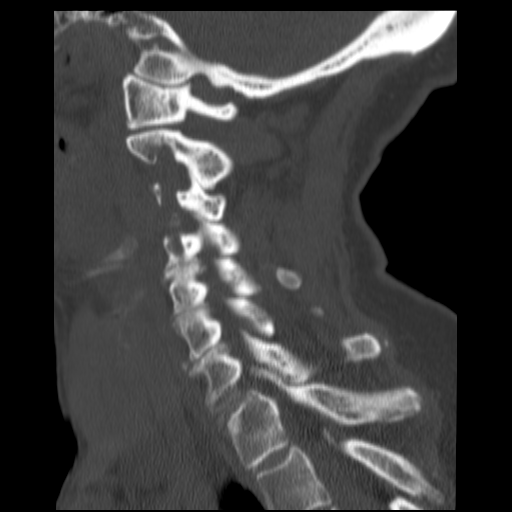

[12 of 33 positions shown; findings below may reference images not displayed]

FINDINGS: Motion degraded images.

No evidence of parenchymal hemorrhage or extra-axial fluid
collection. No mass lesion, mass effect, or midline shift.

No CT evidence of acute infarction.

Subcortical white matter and periventricular small vessel ischemic
changes.

Global cortical atrophy with secondary ventricular prominence.

Cavum septum pellucidum.

The visualized paranasal sinuses are essentially clear. The mastoid
air cells are unopacified.

No evidence of calvarial fracture.
IMPRESSION: No evidence of acute intracranial abnormality.

Atrophy with small vessel ischemic changes.

CT CERVICAL SPINE
FINDINGS: Normal cervical lordosis.

No evidence of fracture or dislocation.  Vertebral body heights
maintained.  The dens appears intact.

No prevertebral soft tissue swelling.

Multilevel degenerative changes.

Visualized thyroid is unremarkable.

Visualized lung apices are notable for a stable right apical
pleural parenchymal scarring.
IMPRESSION: No evidence of traumatic injury to the cervical spine.

Multilevel degenerative changes.

## 2012-10-10 DIAGNOSIS — J189 Pneumonia, unspecified organism: Secondary | ICD-10-CM

## 2012-10-10 HISTORY — DX: Pneumonia, unspecified organism: J18.9

## 2012-10-31 ENCOUNTER — Encounter (HOSPITAL_COMMUNITY): Payer: Self-pay | Admitting: Emergency Medicine

## 2012-10-31 ENCOUNTER — Emergency Department (HOSPITAL_COMMUNITY): Payer: Medicare Other

## 2012-10-31 ENCOUNTER — Inpatient Hospital Stay (HOSPITAL_COMMUNITY)
Admission: EM | Admit: 2012-10-31 | Discharge: 2012-11-05 | DRG: 193 | Disposition: A | Discharge status: Skilled Nursing Facility | Payer: Medicare Other | Attending: Internal Medicine | Admitting: Internal Medicine

## 2012-10-31 DIAGNOSIS — J45901 Unspecified asthma with (acute) exacerbation: Secondary | ICD-10-CM

## 2012-10-31 DIAGNOSIS — J189 Pneumonia, unspecified organism: Principal | ICD-10-CM

## 2012-10-31 DIAGNOSIS — I129 Hypertensive chronic kidney disease with stage 1 through stage 4 chronic kidney disease, or unspecified chronic kidney disease: Secondary | ICD-10-CM | POA: Diagnosis present

## 2012-10-31 DIAGNOSIS — R531 Weakness: Secondary | ICD-10-CM

## 2012-10-31 DIAGNOSIS — R0602 Shortness of breath: Secondary | ICD-10-CM

## 2012-10-31 DIAGNOSIS — D72829 Elevated white blood cell count, unspecified: Secondary | ICD-10-CM | POA: Diagnosis present

## 2012-10-31 DIAGNOSIS — F0280 Dementia in other diseases classified elsewhere without behavioral disturbance: Secondary | ICD-10-CM | POA: Diagnosis present

## 2012-10-31 DIAGNOSIS — N189 Chronic kidney disease, unspecified: Secondary | ICD-10-CM

## 2012-10-31 DIAGNOSIS — F0390 Unspecified dementia without behavioral disturbance: Secondary | ICD-10-CM

## 2012-10-31 DIAGNOSIS — G309 Alzheimer's disease, unspecified: Secondary | ICD-10-CM | POA: Diagnosis present

## 2012-10-31 DIAGNOSIS — E785 Hyperlipidemia, unspecified: Secondary | ICD-10-CM | POA: Diagnosis present

## 2012-10-31 DIAGNOSIS — R062 Wheezing: Secondary | ICD-10-CM

## 2012-10-31 DIAGNOSIS — I10 Essential (primary) hypertension: Secondary | ICD-10-CM

## 2012-10-31 DIAGNOSIS — R0902 Hypoxemia: Secondary | ICD-10-CM

## 2012-10-31 DIAGNOSIS — J9600 Acute respiratory failure, unspecified whether with hypoxia or hypercapnia: Secondary | ICD-10-CM

## 2012-10-31 DIAGNOSIS — N183 Chronic kidney disease, stage 3 unspecified: Secondary | ICD-10-CM | POA: Diagnosis present

## 2012-10-31 LAB — COMPREHENSIVE METABOLIC PANEL
ALT: 9 U/L (ref 0–35)
Alkaline Phosphatase: 49 U/L (ref 39–117)
CO2: 27 mEq/L (ref 19–32)
Chloride: 104 mEq/L (ref 96–112)
GFR calc Af Amer: 48 mL/min — ABNORMAL LOW (ref >90)
GFR calc non Af Amer: 41 mL/min — ABNORMAL LOW (ref >90)
Glucose, Bld: 116 mg/dL — ABNORMAL HIGH (ref 70–99)
Potassium: 3.6 mEq/L (ref 3.5–5.1)
Sodium: 143 mEq/L (ref 135–145)

## 2012-10-31 LAB — URINALYSIS, ROUTINE W REFLEX MICROSCOPIC
Bilirubin Urine: NEGATIVE
Glucose, UA: NEGATIVE mg/dL
Hgb urine dipstick: NEGATIVE
Protein, ur: 30 mg/dL — AB
Urobilinogen, UA: 1 mg/dL (ref 0.0–1.0)

## 2012-10-31 LAB — CBC WITH DIFFERENTIAL/PLATELET
Lymphocytes Relative: 17 % (ref 12–46)
Lymphs Abs: 1.1 10*3/uL (ref 0.7–4.0)
MCV: 92.9 fL (ref 78.0–100.0)
Neutro Abs: 5.2 10*3/uL (ref 1.7–7.7)
Neutrophils Relative %: 80 % — ABNORMAL HIGH (ref 43–77)
Platelets: 159 10*3/uL (ref 150–400)
RBC: 3.82 MIL/uL — ABNORMAL LOW (ref 3.87–5.11)
WBC: 6.5 10*3/uL (ref 4.0–10.5)

## 2012-10-31 MED ORDER — HEPARIN SODIUM (PORCINE) 5000 UNIT/ML IJ SOLN
5000.0000 [IU] | Freq: Three times a day (TID) | INTRAMUSCULAR | Status: DC
  Administered 2012-10-31 – 2012-11-05 (×8): 5000 [IU] via SUBCUTANEOUS
  Filled 2012-10-31 (×17): qty 1

## 2012-10-31 MED ORDER — GUAIFENESIN 100 MG/5ML PO SOLN: 10.0000 mL | ORAL | Status: DC | PRN

## 2012-10-31 MED ORDER — ALBUTEROL SULFATE (5 MG/ML) 0.5% IN NEBU: 2.5000 mg | INHALATION_SOLUTION | RESPIRATORY_TRACT | Status: DC | PRN

## 2012-10-31 MED ORDER — ASPIRIN EC 81 MG PO TBEC
81.0000 mg | DELAYED_RELEASE_TABLET | ORAL | Status: DC
  Administered 2012-11-01 – 2012-11-05 (×3): 81 mg via ORAL
  Filled 2012-10-31 (×3): qty 1

## 2012-10-31 MED ORDER — MONTELUKAST SODIUM 10 MG PO TABS
10.0000 mg | ORAL_TABLET | Freq: Every day | ORAL | Status: DC
  Administered 2012-10-31 – 2012-11-04 (×5): 10 mg via ORAL
  Filled 2012-10-31 (×6): qty 1

## 2012-10-31 MED ORDER — LORAZEPAM 0.5 MG PO TABS
0.2500 mg | ORAL_TABLET | Freq: Two times a day (BID) | ORAL | Status: DC
  Administered 2012-10-31 – 2012-11-02 (×3): 0.25 mg via ORAL
  Administered 2012-11-02: 23:00:00 via ORAL
  Administered 2012-11-03 – 2012-11-05 (×5): 0.25 mg via ORAL
  Filled 2012-10-31 (×9): qty 1

## 2012-10-31 MED ORDER — LEVOFLOXACIN IN D5W 750 MG/150ML IV SOLN
750.0000 mg | INTRAVENOUS | Status: DC
  Administered 2012-10-31: 750 mg via INTRAVENOUS
  Filled 2012-10-31: qty 150

## 2012-10-31 MED ORDER — DOCUSATE SODIUM 100 MG PO CAPS
100.0000 mg | ORAL_CAPSULE | Freq: Two times a day (BID) | ORAL | Status: DC
  Administered 2012-10-31 – 2012-11-05 (×10): 100 mg via ORAL
  Filled 2012-10-31 (×11): qty 1

## 2012-10-31 MED ORDER — DEXTROSE 5 % IV SOLN
2.0000 g | Freq: Three times a day (TID) | INTRAVENOUS | Status: DC
  Administered 2012-10-31 – 2012-11-01 (×3): 2 g via INTRAVENOUS
  Filled 2012-10-31 (×4): qty 2

## 2012-10-31 MED ORDER — FERROUS SULFATE 325 (65 FE) MG PO TABS
325.0000 mg | ORAL_TABLET | Freq: Two times a day (BID) | ORAL | Status: DC
  Administered 2012-10-31 – 2012-11-05 (×10): 325 mg via ORAL
  Filled 2012-10-31 (×12): qty 1

## 2012-10-31 MED ORDER — ALBUTEROL SULFATE (5 MG/ML) 0.5% IN NEBU
2.5000 mg | INHALATION_SOLUTION | Freq: Four times a day (QID) | RESPIRATORY_TRACT | Status: DC
  Administered 2012-10-31 – 2012-11-01 (×5): 2.5 mg via RESPIRATORY_TRACT
  Filled 2012-10-31 (×5): qty 0.5

## 2012-10-31 MED ORDER — SODIUM CHLORIDE 0.9 % IV SOLN
INTRAVENOUS | Status: AC
  Administered 2012-10-31: 17:00:00 via INTRAVENOUS

## 2012-10-31 MED ORDER — DONEPEZIL HCL 10 MG PO TABS
10.0000 mg | ORAL_TABLET | Freq: Every day | ORAL | Status: DC
  Administered 2012-10-31 – 2012-11-04 (×5): 10 mg via ORAL
  Filled 2012-10-31 (×6): qty 1

## 2012-10-31 MED ORDER — LEVOFLOXACIN IN D5W 750 MG/150ML IV SOLN
750.0000 mg | INTRAVENOUS | Status: DC
  Filled 2012-10-31: qty 150

## 2012-10-31 MED ORDER — ALBUTEROL SULFATE (5 MG/ML) 0.5% IN NEBU
2.5000 mg | INHALATION_SOLUTION | RESPIRATORY_TRACT | Status: AC
  Administered 2012-10-31: 2.5 mg via RESPIRATORY_TRACT

## 2012-10-31 MED ORDER — PANTOPRAZOLE SODIUM 40 MG PO TBEC
40.0000 mg | DELAYED_RELEASE_TABLET | Freq: Every day | ORAL | Status: DC
  Administered 2012-11-01 – 2012-11-05 (×5): 40 mg via ORAL
  Filled 2012-10-31 (×5): qty 1

## 2012-10-31 MED ORDER — LOSARTAN POTASSIUM 50 MG PO TABS
100.0000 mg | ORAL_TABLET | Freq: Every day | ORAL | Status: DC
  Administered 2012-11-01 – 2012-11-05 (×5): 100 mg via ORAL
  Filled 2012-10-31 (×5): qty 2

## 2012-10-31 MED ORDER — DEXTROSE 5 % IV SOLN: 2.0000 g | Freq: Three times a day (TID) | INTRAVENOUS | Status: DC

## 2012-10-31 MED ORDER — METOPROLOL TARTRATE 12.5 MG HALF TABLET
12.5000 mg | ORAL_TABLET | Freq: Two times a day (BID) | ORAL | Status: DC
  Administered 2012-10-31 – 2012-11-02 (×4): 12.5 mg via ORAL
  Filled 2012-10-31 (×5): qty 1

## 2012-10-31 MED ORDER — METHYLPREDNISOLONE SODIUM SUCC 125 MG IJ SOLR
60.0000 mg | Freq: Three times a day (TID) | INTRAMUSCULAR | Status: DC
  Administered 2012-10-31 – 2012-11-01 (×3): 60 mg via INTRAVENOUS
  Filled 2012-10-31 (×6): qty 0.96

## 2012-10-31 MED ORDER — VANCOMYCIN HCL 500 MG IV SOLR
500.0000 mg | Freq: Two times a day (BID) | INTRAVENOUS | Status: DC
  Administered 2012-10-31 – 2012-11-02 (×4): 500 mg via INTRAVENOUS
  Filled 2012-10-31 (×5): qty 500

## 2012-10-31 MED ORDER — ATORVASTATIN CALCIUM 20 MG PO TABS
20.0000 mg | ORAL_TABLET | Freq: Every day | ORAL | Status: DC
  Administered 2012-10-31 – 2012-11-04 (×5): 20 mg via ORAL
  Filled 2012-10-31 (×6): qty 1

## 2012-10-31 MED ORDER — VITAMIN D3 25 MCG (1000 UNIT) PO TABS
1000.0000 [IU] | ORAL_TABLET | Freq: Every day | ORAL | Status: DC
  Administered 2012-10-31 – 2012-11-05 (×6): 1000 [IU] via ORAL
  Filled 2012-10-31 (×6): qty 1

## 2012-10-31 MED ORDER — LEVOFLOXACIN IN D5W 750 MG/150ML IV SOLN: 750.0000 mg | INTRAVENOUS | Status: DC

## 2012-10-31 MED ORDER — FOLIC ACID 1 MG PO TABS
1.0000 mg | ORAL_TABLET | ORAL | Status: DC
  Administered 2012-11-01 – 2012-11-05 (×3): 1 mg via ORAL
  Filled 2012-10-31 (×3): qty 1

## 2012-10-31 MED ORDER — DIVALPROEX SODIUM 125 MG PO CPSP
500.0000 mg | ORAL_CAPSULE | Freq: Two times a day (BID) | ORAL | Status: DC
  Administered 2012-10-31 – 2012-11-05 (×10): 500 mg via ORAL
  Filled 2012-10-31 (×11): qty 4

## 2012-10-31 NOTE — ED Notes (Signed)
ZOX:[id number]<AV> Expected date:<BR> Expected time:<BR> Means of arrival:<BR> Comments:<BR> Elderly 02 sat 92%

## 2012-10-31 NOTE — ED Notes (Signed)
1st attempt to obtain labs, pt in x-xray

## 2012-10-31 NOTE — ED Notes (Signed)
Per EMS: Pt is from [facility]. Pt c/o of weakness since yesterday at 11am. Facility reports that she also has had low O2 saturation in the low 80's. EMS reports patient has been in low 90's and was given 5 mg albuterol and 125 mg of solumedrol in route to ED. EMS reports wheezing and rhonchi throughout lung fields. Pt has had a cough. Pt was given antibiotics on Monday by PCP. Pt has baseline of dementia.

## 2012-10-31 NOTE — ED Notes (Signed)
Attempted to start levofloxacin, but dose rescheduled by time medication reached bedside

## 2012-10-31 NOTE — ED Notes (Signed)
Patient transported to X-ray 

## 2012-10-31 NOTE — Progress Notes (Signed)
ANTIBIOTIC CONSULT NOTE - INITIAL  Pharmacy Consult for Vancomycin Indication: rule out pneumonia  Allergies  Allergen Reactions  . Penicillins Other (See Comments)    Per mar    Patient Measurements:    Vital Signs: Temp: 98.3 F (36.8 C) (03/22 1129) Temp src: Oral (03/22 1129) BP: 137/83 mmHg (03/22 1129) Intake/Output from previous day:   Intake/Output from this shift:    Labs:  Recent Labs  10/31/12 1317  WBC 6.5  HGB 11.4*  PLT 159  CREATININE 1.21*   CrCl is unknown because there is no height on file for the current visit. No results found for this basename: VANCOTROUGH, VANCOPEAK, VANCORANDOM, GENTTROUGH, GENTPEAK, GENTRANDOM, TOBRATROUGH, TOBRAPEAK, TOBRARND, AMIKACINPEAK, AMIKACINTROU, AMIKACIN,  in the last 72 hours   Microbiology: No results found for this or any previous visit (from the past 720 hour(s)).  Medical History: Past Medical History  Diagnosis Date  . Alzheimer disease   . Dementia   . Hypertension   . Hypercholesteremia   . GERD (gastroesophageal reflux disease)   . Hyponatremia   . Seizures   . Asthma     Assessment: 80 YOF to begin empiric vancomycin/azactam/levaquin for suspected pneumonia.  CrCl~42 ml/min (normalized), ~36 ml/min (CG).  Goal of Therapy:  Vancomycin trough level 15-20 mcg/ml  Plan:  Vancomycin 500 mg IV q12h Levaquin 750 mg IV q48h. Azactam 2g IV q8h.  Clance Boll 10/31/2012,2:21 PM

## 2012-10-31 NOTE — ED Provider Notes (Signed)
History     CSN: [id number]  Arrival date & time 10/31/12  1123   First MD Initiated Contact with Patient 10/31/12 1132      Chief Complaint  Patient presents with  . Weakness    (Consider location/radiation/quality/duration/timing/severity/associated sxs/prior treatment) HPI Patient presents with concerns of fatigue, dyspnea.  Per report the patient had hypoxia as well.  The patient has dementia, level V caveat.  History of present illness is per the patient's daughter and nursing home report.  Per report the patient was generally in her usual state of health until yesterday, when she developed fatigue, cough, congestion, Sunday mildly hypoxic.  Symptoms improved mildly with albuterol, but symptoms persisted to today the patient also was started on antibiotics several days ago, though it is unclear what antibiotics. The patient denies pain, states that she is concerned of ongoing cough, congestion, dyspnea.  She denies nausea, confusion, disorientation.  Past Medical History  Diagnosis Date  . Alzheimer disease   . Dementia   . Hypertension   . Hypercholesteremia   . GERD (gastroesophageal reflux disease)   . Hyponatremia   . Seizures   . Asthma     History reviewed. No pertinent past surgical history.  No family history on file.  History  Substance Use Topics  . Smoking status: Never Smoker   . Smokeless tobacco: Never Used  . Alcohol Use: No    OB History   Grav Para Term Preterm Abortions TAB SAB Ect Mult Living                  Review of Systems  Unable to perform ROS: Dementia    Allergies  Penicillins  Home Medications   Current Outpatient Rx  Name  Route  Sig  Dispense  Refill  . albuterol (PROVENTIL HFA;VENTOLIN HFA) 108 (90 BASE) MCG/ACT inhaler   Inhalation   Inhale 2 puffs into the lungs 2 (two) times daily.           Marland Kitchen albuterol (PROVENTIL HFA;VENTOLIN HFA) 108 (90 BASE) MCG/ACT inhaler   Inhalation   Inhale 2 puffs into the lungs every  4 (four) hours as needed for wheezing or shortness of breath.         Marland Kitchen aspirin EC 81 MG tablet   Oral   Take 81 mg by mouth every other day.         . cholecalciferol (VITAMIN D) 1000 UNITS tablet   Oral   Take 1,000 Units by mouth daily.           . divalproex (DEPAKOTE SPRINKLE) 125 MG capsule   Oral   Take 500 mg by mouth 2 (two) times daily. Sprinkles 4 capsules on food twice a day         . docusate sodium (COLACE) 100 MG capsule   Oral   Take 100 mg by mouth 2 (two) times daily.           Marland Kitchen donepezil (ARICEPT) 10 MG tablet   Oral   Take 10 mg by mouth at bedtime.          . ferrous sulfate 325 (65 FE) MG tablet   Oral   Take 325 mg by mouth 2 (two) times daily.          . folic acid (FOLVITE) 1 MG tablet   Oral   Take 1 mg by mouth every other day.           Marland Kitchen guaiFENesin (ROBITUSSIN) 100 MG/5ML  SOLN   Oral   Take 10 mLs by mouth every 4 (four) hours as needed (give every 4 hours while awake for 10 days as needed for cough).         Marland Kitchen levofloxacin (LEVAQUIN) 500 MG tablet   Oral   Take 500 mg by mouth daily. For 10 days         . LORazepam (ATIVAN) 0.5 MG tablet   Oral   Take 0.25 mg by mouth 2 (two) times daily.         Marland Kitchen losartan (COZAAR) 100 MG tablet   Oral   Take 100 mg by mouth daily.           . metoprolol tartrate (LOPRESSOR) 25 MG tablet   Oral   Take 12.5 mg by mouth 2 (two) times daily.          . montelukast (SINGULAIR) 10 MG tablet   Oral   Take 10 mg by mouth at bedtime.           . Multiple Vitamins-Calcium (ONE-A-DAY WOMENS PO)   Oral   Take 1 tablet by mouth daily at 3 pm.          . pantoprazole (PROTONIX) 40 MG tablet   Oral   Take 40 mg by mouth daily.           . rosuvastatin (CRESTOR) 10 MG tablet   Oral   Take 10 mg by mouth daily.             BP 137/83  Temp(Src) 98.3 F (36.8 C) (Oral)  Resp 16  SpO2 96%  Physical Exam  Nursing note and vitals reviewed. Constitutional: She  appears well-developed and well-nourished. No distress.  HENT:  Head: Normocephalic and atraumatic.  Eyes: Conjunctivae and EOM are normal.  Cardiovascular: Regular rhythm.  Tachycardia present.   Murmur heard. Pulmonary/Chest: No stridor. Tachypnea noted. No respiratory distress. She has decreased breath sounds. She has wheezes.  Abdominal: She exhibits no distension.  Musculoskeletal: She exhibits no edema.  Neurological: She is alert. No cranial nerve deficit.  Moves all extremities spontaneously, no focal deficits.  Skin: Skin is warm and dry.  Psychiatric: She has a normal mood and affect. Her speech is delayed. Cognition and memory are impaired. She exhibits abnormal recent memory and abnormal remote memory.    ED Course  BLADDER CATHETERIZATION Date/Time: 10/31/2012 1:40 PM Performed by: Gerhard Munch Authorized by: Gerhard Munch Consent: Verbal consent obtained. Risks and benefits: risks, benefits and alternatives were discussed Consent given by: guardian Patient identity confirmed: hospital-assigned identification number Time out: Immediately prior to procedure a "time out" was called to verify the correct patient, procedure, equipment, support staff and site/side marked as required. Indications: urine specimen collection Local anesthesia used: no Patient sedated: no Preparation: Patient was prepped and draped in the usual sterile fashion. Catheter insertion: non-indwelling Complicated insertion: no Altered anatomy: no Bladder irrigation: no Number of attempts: 1 Urine characteristics: clear Patient tolerance: Patient tolerated the procedure well with no immediate complications.   (including critical care time)  Labs Reviewed  CBC WITH DIFFERENTIAL  COMPREHENSIVE METABOLIC PANEL  LIPASE, BLOOD  URINALYSIS, ROUTINE W REFLEX MICROSCOPIC  TROPONIN I  PRO B NATRIURETIC PEPTIDE   No results found.   No diagnosis found.  Pulse ox 85%ra  > 95% with nasal  cannula abnormal   Cardiac: 105st, abnormal   Date: 10/31/2012  Rate: 101  Rhythm: sinus tachycardia  QRS Axis: normal  Intervals: QT  prolonged  ST/T Wave abnormalities: nonspecific ST/T changes  Conduction Disutrbances:none  Narrative Interpretation:   Old EKG Reviewed: changes noted Longer QT - abnormal  I interpreted x-ray, demonstrated to the family. MDM  This patient presents from her nursing facility with concern of hypoxia, cough, congestion.  On exam she is hypoxic, though this corrected with supplemental oxygen.  The patient is afebrile, seemingly in no distress.  Given her history of dementia she had extensive evaluation.  The patient's evaluation demonstrates likely pneumonia.  She was admitted for further evaluation and management of this episode of healthcare associated pneumonia        Gerhard Munch, MD 10/31/12 1505
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
## 2012-11-01 DIAGNOSIS — J45901 Unspecified asthma with (acute) exacerbation: Secondary | ICD-10-CM

## 2012-11-01 DIAGNOSIS — N189 Chronic kidney disease, unspecified: Secondary | ICD-10-CM

## 2012-11-01 DIAGNOSIS — J189 Pneumonia, unspecified organism: Principal | ICD-10-CM

## 2012-11-01 DIAGNOSIS — F0390 Unspecified dementia without behavioral disturbance: Secondary | ICD-10-CM

## 2012-11-01 LAB — BASIC METABOLIC PANEL
Chloride: 106 mEq/L (ref 96–112)
Creatinine, Ser: 1.15 mg/dL — ABNORMAL HIGH (ref 0.50–1.10)

## 2012-11-01 LAB — CBC
MCHC: 34.1 g/dL (ref 30.0–36.0)
MCV: 90.5 fL (ref 78.0–100.0)
Platelets: 150 10*3/uL (ref 150–400)
RDW: 12.7 % (ref 11.5–15.5)
WBC: 7.3 10*3/uL (ref 4.0–10.5)

## 2012-11-01 MED ORDER — METHYLPREDNISOLONE SODIUM SUCC 125 MG IJ SOLR
60.0000 mg | Freq: Four times a day (QID) | INTRAMUSCULAR | Status: DC
  Administered 2012-11-01 – 2012-11-03 (×8): 60 mg via INTRAVENOUS
  Filled 2012-11-01 (×12): qty 0.96

## 2012-11-01 MED ORDER — DEXTROSE 5 % IV SOLN
1.0000 g | Freq: Two times a day (BID) | INTRAVENOUS | Status: DC
  Administered 2012-11-01 – 2012-11-02 (×3): 1 g via INTRAVENOUS
  Filled 2012-11-01 (×4): qty 1

## 2012-11-01 MED ORDER — SODIUM CHLORIDE 0.9 % IV SOLN: Freq: Once | INTRAVENOUS | Status: DC

## 2012-11-01 NOTE — Progress Notes (Addendum)
[facility] PROGRESS NOTE  [name] ZOX:[id number] DOB: [birth year] DOA: 10/31/2012 PCP: Florentina Jenny, MD  BRIEF HISTORY 79 year old female with a history of asthma, dementia, hypertension, hyperlipidemia, paroxysmal atrial fibrillation, and coronary artery disease presents with a one-week history of cough and congestion. The patient had a fever of 102.57F one week prior to admission. The patient was started on an unknown antibiotic. There is no improvement. The patient continued to have low-grade fevers up to 101F. However, she has been afebrile since admission to the hospital. There has been no hemoptysis, nausea, vomiting, diarrhea, rashes. Workup in the emergency department revealed WBC 7.3, BUN 30, creatinine 1.21, tachycardia up to 120, oxygen saturation 91-98% on room air. ProBNP was 736. Urinalysis was unremarkable. Chest x-ray shows right lower lobe haziness. The patient was started on vancomycin, aztreonam, and Levaquin. Aztreonam was switched to cefepime after clarifying that the patient's penicillin allergy was unclear. Assessment/Plan: Acute asthma exacerbation -Solu-Medrol 60 mg IV every 6 hours -Continue albuterol every 6 hours -Incentive spirometry -Continue Singulair Right lower lobe infiltrate -Concerned about healthcare associated pneumonia -Discontinue aztreonam -Continue vancomycin, cefepime, levofloxacin -Repeat chest x-ray in the morning after intravenous fluids to clarify infiltrate Elevated proBNP -Patient appears slightly hypovolemic -Does not appear volume overloaded -Judicious fluids -Likely due to CKD -Echocardiogram CKD stage III -Baseline creatinine 1.2-1.3 -Continue IV fluids Dementia -Continue Aricept -Continue Depakote Hypertension -Continue losartan and metoprolol tartrate Hyperlipidemia -Continue statins -Patient was taking Crestor     Family Communication:   Daughter and husband at beside Disposition Plan:   SNF when medically  stable    Antibiotics:  Aztreonam 10/31/2012>> 11/01/2012  Vancomycin 10/31/2012>>>  Levofloxacin 10/31/2012>>>  Cefepime 10/31/2012>>>    Procedures/Studies: Dg Chest 1 View  10/31/2012  *RADIOLOGY REPORT*  Clinical Data: Confusion, discomfort  CHEST - 1 VIEW  Comparison: 05/12/2011  Findings: Cardiomediastinal silhouette is stable.  No pulmonary edema.  Hazy right basilar atelectasis or infiltrate.  IMPRESSION: No pulmonary edema.  Hazy right basilar atelectasis or infiltrate.   Original Report Authenticated By: Natasha Mead, M.D.          Subjective: Patient is pleasantly confused. She denies fevers, chills, chest pain, shortness of breath, vomiting, diarrhea, abdominal pain, headache.   Objective: Filed Vitals:   10/31/12 2105 10/31/12 2305 11/01/12 0605 11/01/12 0824  BP: 88/77 157/78 163/97   Pulse: 120 102 95   Temp: 98.6 F (37 C)  98.1 F (36.7 C)   TempSrc: Axillary  Axillary   Resp: 18  16   Height:      Weight:      SpO2: 96%  98% 95%    Intake/Output Summary (Last 24 hours) at 11/01/12 1119 Last data filed at 11/01/12 0806  Gross per 24 hour  Intake     30 ml  Output      0 ml  Net     30 ml   Weight change:  Exam:   General:  Pt is alert, follows commands appropriately, not in acute distress  HEENT: No icterus, No thrush, [place]/AT  Cardiovascular: RRR, S1/S2, no rubs, no gallops  Respiratory: Bibasilar rales with bilateral expiratory wheeze. Poor inspiratory effort.   Abdomen: Soft/+BS, non tender, non distended, no guarding  Extremities: No edema, No lymphangitis, No petechiae, No rashes, no synovitis  Data Reviewed: Basic Metabolic Panel:  Recent Labs Lab 10/31/12 1317 11/01/12 0545  NA 143 143  K 3.6 3.5  CL 104 106  CO2 27 25  GLUCOSE 116* 140*  BUN 30*  30*  CREATININE 1.21* 1.15*  CALCIUM 9.4 9.0   Liver Function Tests:  Recent Labs Lab 10/31/12 1317  AST 18  ALT 9  ALKPHOS 49  BILITOT 0.3  PROT 6.7  ALBUMIN  2.9*    Recent Labs Lab 10/31/12 1317  LIPASE 28   No results found for this basename: AMMONIA,  in the last 168 hours CBC:  Recent Labs Lab 10/31/12 1317 11/01/12 0545  WBC 6.5 7.3  NEUTROABS 5.2  --   HGB 11.4* 10.1*  HCT 35.5* 29.6*  MCV 92.9 90.5  PLT 159 150   Cardiac Enzymes:  Recent Labs Lab 10/31/12 1317  TROPONINI <0.30   BNP: No components found with this basename: POCBNP,  CBG: No results found for this basename: GLUCAP,  in the last 168 hours  Recent Results (from the past 240 hour(s))  CULTURE, BLOOD (ROUTINE X 2)     Status: None   Collection Time    10/31/12  4:10 PM      Result Value Range Status   Specimen Description BLOOD LEFT ARM  2 ML IN [facility] BOTTLE   Final   Special Requests NONE   Final   Culture  Setup Time 10/31/2012 21:48   Final   Culture     Final   Value:        BLOOD CULTURE RECEIVED NO GROWTH TO DATE CULTURE WILL BE HELD FOR 5 DAYS BEFORE ISSUING A FINAL NEGATIVE REPORT   Report Status PENDING   Incomplete  CULTURE, BLOOD (ROUTINE X 2)     Status: None   Collection Time    10/31/12  4:23 PM      Result Value Range Status   Specimen Description BLOOD LEFT HAND  2 ML IN [facility] BOTTLE   Final   Special Requests NONE   Final   Culture  Setup Time 10/31/2012 21:48   Final   Culture     Final   Value:        BLOOD CULTURE RECEIVED NO GROWTH TO DATE CULTURE WILL BE HELD FOR 5 DAYS BEFORE ISSUING A FINAL NEGATIVE REPORT   Report Status PENDING   Incomplete     Scheduled Meds: . albuterol  2.5 mg Nebulization Q6H  . aspirin EC  81 mg Oral QODAY  . atorvastatin  20 mg Oral q1800  . ceFEPime (MAXIPIME) IV  1 g Intravenous Q8H  . cholecalciferol  1,000 Units Oral Daily  . divalproex  500 mg Oral BID  . docusate sodium  100 mg Oral BID  . donepezil  10 mg Oral QHS  . ferrous sulfate  325 mg Oral BID  . folic acid  1 mg Oral QODAY  . heparin  5,000 Units Subcutaneous Q8H  . levofloxacin (LEVAQUIN) IV  750 mg Intravenous Q48H  .  LORazepam  0.25 mg Oral BID  . losartan  100 mg Oral Daily  . methylPREDNISolone (SOLU-MEDROL) injection  60 mg Intravenous Q6H  . metoprolol tartrate  12.5 mg Oral BID  . montelukast  10 mg Oral QHS  . pantoprazole  40 mg Oral Daily  . vancomycin  500 mg Intravenous Q12H   Continuous Infusions: . sodium chloride       Camiah Humm, DO  [facility] Pager [phone]  If 7PM-7AM, please contact night-coverage [email or web address] Password [email or web address] 11/01/2012, 11:19 AM   LOS: 1 day

## 2012-11-01 NOTE — Evaluation (Signed)
Physical Therapy Evaluation Patient Details Name: [name] MRN: [id number] DOB: 19-Jul-1931 Today's Date: 11/01/2012 Time: 4098-1191 PT Time Calculation (min): 38 min  PT Assessment / Plan / Recommendation Clinical Impression  Pt presents with HCAP and history of dementia (note pt is resident at [facility] ALF memory care unit).  Tolerated OOB and ambulation in hallway, however is very unsteady and requires mod assist and MAX encouragement from therapist and daughter to participate with therapy.  Pt will benefit from skilled PT in acute venue to address deficits.  PT recommends ST SNF before hopeful return to ALF as she is needing more assist at this time.     PT Assessment  Patient needs continued PT services    Follow Up Recommendations  SNF;Supervision/Assistance - 24 hour    Does the patient have the potential to tolerate intense rehabilitation      Barriers to Discharge Decreased caregiver support unsure if ALF able to provide mod assist.     Equipment Recommendations  None recommended by PT    Recommendations for Other Services OT consult   Frequency Min 3X/week    Precautions / Restrictions Precautions Precautions: Fall Restrictions Weight Bearing Restrictions: No   Pertinent Vitals/Pain Pt denies pain during session.       Mobility  Bed Mobility Bed Mobility: Supine to Sit Supine to Sit: 3: Mod assist;2: Max assist;HOB elevated Details for Bed Mobility Assistance: Pt requires assist with LEs out of bed and for trunk to attain sitting position.  MAX cues for encouragement from therapist and daughter to participate in therapy.  Transfers Transfers: Sit to Stand;Stand to Sit Sit to Stand: 3: Mod assist;With upper extremity assist;From bed Stand to Sit: With upper extremity assist;With armrests;To chair/3-in-1;2: Max assist Details for Transfer Assistance: Assist to rise, maintain upright stance and ensure controlled descent.   Again MAX cues for participating in  therapy and keeping in standing position.  Pt sat back to bed without alerting therapist and then when returning to room from amb also attempted to sit in chair without being all the way there.  Ambulation/Gait Ambulation/Gait Assistance: 3: Mod assist Ambulation Distance (Feet): 45 Feet Assistive device: Rolling walker Ambulation/Gait Assistance Details: Pts daughter and therapist provided max encouragement to continue with amb and pt did initially well, however once in hallway, she wanted to enter different room and therefore therapist provided max cues for negotiation of RW and attempted to guide pt to correct room, however pt got somewhat agitated and stated "quit pulling on me."  Pt was able to make it back to room, however requires MAX coaxing to continue with amb.  Gait Pattern: Step-to pattern;Step-through pattern;Decreased stride length;Shuffle;Narrow base of support;Trunk flexed Gait velocity: decreased Stairs: No Wheelchair Mobility Wheelchair Mobility: No    Exercises     PT Diagnosis: Difficulty walking;Generalized weakness  PT Problem List: Decreased strength;Decreased activity tolerance;Decreased balance;Decreased mobility;Decreased coordination;Decreased cognition;Decreased knowledge of use of DME;Decreased safety awareness;Decreased knowledge of precautions PT Treatment Interventions: DME instruction;Gait training;Functional mobility training;Therapeutic activities;Therapeutic exercise;Balance training;Patient/family education   PT Goals Acute Rehab PT Goals PT Goal Formulation: With family Time For Goal Achievement: 11/08/12 Potential to Achieve Goals: Fair Pt will go Supine/Side to Sit: with supervision PT Goal: Supine/Side to Sit - Progress: Goal set today Pt will go Sit to Supine/Side: with supervision PT Goal: Sit to Supine/Side - Progress: Goal set today Pt will go Sit to Stand: with supervision PT Goal: Sit to Stand - Progress: Goal set today Pt will go Stand to  Sit: with supervision PT Goal: Stand to Sit - Progress: Goal set today Pt will Ambulate: 51 - 150 feet;with min assist;with least restrictive assistive device PT Goal: Ambulate - Progress: Goal set today  Visit Information  Last PT Received On: 11/01/12 Assistance Needed: +2 (safety)    Subjective Data  Subjective: Tell my brothers and sisters to come and get me.  Patient Stated Goal: n/a   Prior Functioning  Home Living Lives With:  (At [facility]) Available Help at Discharge: Personal care attendant Bathroom Shower/Tub: Walk-in shower Bathroom Toilet: Handicapped height Home Adaptive Equipment: Walker - rolling Prior Function Level of Independence: Needs assistance Needs Assistance: Bathing;Dressing;Toileting Bath: Minimal Dressing: Minimal Toileting: Minimal Vocation: Retired    Copywriter, advertising Overall Cognitive Status: History of cognitive impairments - further impaired Area of Impairment: Following commands;Awareness of errors;Awareness of deficits;Safety/judgement Arousal/Alertness: Awake/alert Orientation Level: Disoriented to;Place;Time;Situation Following Commands: Follows one step commands inconsistently Safety/Judgement: Decreased awareness of safety precautions;Decreased safety judgement for tasks assessed;Decreased awareness of need for assistance Awareness of Errors: Assistance required to identify errors made;Assistance required to correct errors made Cognition - Other Comments: Per pts daughter, pt more confused since being in hospital.     Extremity/Trunk Assessment Right Lower Extremity Assessment RLE ROM/Strength/Tone: Deficits RLE ROM/Strength/Tone Deficits: pt with generalized weakness, grossly 3+/5 per functional observation.  Left Lower Extremity Assessment LLE ROM/Strength/Tone: Deficits LLE ROM/Strength/Tone Deficits: Pt with generalized weakness, grossly 3+/5 per functional observation.  Trunk Assessment Trunk Assessment: Kyphotic    Balance    End of Session PT - End of Session Equipment Utilized During Treatment: Gait belt Activity Tolerance: Patient limited by fatigue;Treatment limited secondary to agitation Patient left: in chair;with call bell/phone within reach;with family/visitor present Nurse Communication: Mobility status  GP     Vista Deck 11/01/2012, 5:31 PM

## 2012-11-01 NOTE — Progress Notes (Signed)
  Pharmacy Note (Brief) - Renal Abx Adj  Order for cefepime 1g IV q8h for RLL HA-PNA. CrCl ~ 19ml/min, therefore adjusted to q12h cefepime dosing. Allergies clarified with family member by MD today - does not have a PCN allergy. This was removed from allergy list per MD request.  Darrol Angel, PharmD Pager: [phone] 11/01/2012 11:36 AM

## 2012-11-02 ENCOUNTER — Inpatient Hospital Stay (HOSPITAL_COMMUNITY): Payer: Medicare Other

## 2012-11-02 LAB — CBC
Hemoglobin: 10.3 g/dL — ABNORMAL LOW (ref 12.0–15.0)
MCH: 30.1 pg (ref 26.0–34.0)
MCHC: 33.4 g/dL (ref 30.0–36.0)
MCV: 90.1 fL (ref 78.0–100.0)
RBC: 3.42 MIL/uL — ABNORMAL LOW (ref 3.87–5.11)

## 2012-11-02 LAB — BASIC METABOLIC PANEL
CO2: 25 mEq/L (ref 19–32)
Calcium: 8.6 mg/dL (ref 8.4–10.5)
Creatinine, Ser: 1.1 mg/dL (ref 0.50–1.10)
Glucose, Bld: 131 mg/dL — ABNORMAL HIGH (ref 70–99)

## 2012-11-02 MED ORDER — ALBUTEROL SULFATE (5 MG/ML) 0.5% IN NEBU: 2.5000 mg | INHALATION_SOLUTION | RESPIRATORY_TRACT | Status: DC | PRN

## 2012-11-02 MED ORDER — HYDRALAZINE HCL 20 MG/ML IJ SOLN
10.0000 mg | Freq: Once | INTRAMUSCULAR | Status: AC
  Administered 2012-11-02: 10 mg via INTRAVENOUS
  Filled 2012-11-02: qty 1

## 2012-11-02 MED ORDER — ALBUTEROL SULFATE (5 MG/ML) 0.5% IN NEBU
2.5000 mg | INHALATION_SOLUTION | Freq: Three times a day (TID) | RESPIRATORY_TRACT | Status: DC
  Administered 2012-11-02 – 2012-11-05 (×8): 2.5 mg via RESPIRATORY_TRACT
  Filled 2012-11-02 (×11): qty 0.5

## 2012-11-02 MED ORDER — LEVOFLOXACIN 750 MG PO TABS
750.0000 mg | ORAL_TABLET | ORAL | Status: DC
  Administered 2012-11-02 – 2012-11-04 (×2): 750 mg via ORAL
  Filled 2012-11-02 (×2): qty 1

## 2012-11-02 MED ORDER — VANCOMYCIN HCL 1000 MG IV SOLR
750.0000 mg | Freq: Two times a day (BID) | INTRAVENOUS | Status: DC
  Administered 2012-11-03: 750 mg via INTRAVENOUS
  Filled 2012-11-02 (×2): qty 750

## 2012-11-02 MED ORDER — DEXTROSE 5 % IV SOLN
1.0000 g | Freq: Two times a day (BID) | INTRAVENOUS | Status: DC
  Administered 2012-11-02 – 2012-11-03 (×2): 1 g via INTRAVENOUS
  Filled 2012-11-02 (×4): qty 1

## 2012-11-02 MED ORDER — METOPROLOL TARTRATE 25 MG PO TABS
25.0000 mg | ORAL_TABLET | Freq: Two times a day (BID) | ORAL | Status: DC
  Administered 2012-11-02 – 2012-11-03 (×3): 25 mg via ORAL
  Filled 2012-11-02 (×5): qty 1

## 2012-11-02 NOTE — Progress Notes (Signed)
OT Cancellation Note  Patient Details Name: Patrcia Schnepp MRN: [id number] DOB: 04-07-1931   Cancelled Treatment:    Reason Eval/Treat Not Completed: Patient at procedure or test/ unavailable Will recheck pt later in day or next day. Noted pt from ALF and will possibly DC to SNF. Depending on progress may defer OT eval to SNF. Will recheck on pt. Thanks,  Alba Cory 11/02/2012, 12:10 PM

## 2012-11-02 NOTE — Progress Notes (Signed)
ANTIBIOTIC CONSULT NOTE - FOLLOW UP  Pharmacy Consult for Vancomycin Indication: HCAP, UTI  No Active Allergies  Patient Measurements: Height: 5\' 1"  (154.9 cm) Weight: 135 lb (61.236 kg) IBW/kg (Calculated) : 47.8 Adjusted Body Weight:   Vital Signs: Temp: 97.6 F (36.4 C) (03/24 1509) Temp src: Oral (03/24 1509) BP: 191/81 mmHg (03/24 1509) Pulse Rate: 88 (03/24 1509) Intake/Output from previous day: 03/23 0701 - 03/24 0700 In: 1324 [P.O.:1324] Out: -  Intake/Output from this shift: Total I/O In: 240 [P.O.:240] Out: -   Labs:  Recent Labs  10/31/12 1317 11/01/12 0545 11/02/12 0449  WBC 6.5 7.3 11.2*  HGB 11.4* 10.1* 10.3*  PLT 159 150 163  CREATININE 1.21* 1.15* 1.10   Estimated Creatinine Clearance: 34.8 ml/min (by C-G formula based on Cr of 1.1).  Recent Labs  11/02/12 1716  VANCOTROUGH 12.3     Microbiology: Recent Results (from the past 720 hour(s))  CULTURE, BLOOD (ROUTINE X 2)     Status: None   Collection Time    10/31/12  4:10 PM      Result Value Range Status   Specimen Description BLOOD LEFT ARM  2 ML IN [facility] BOTTLE   Final   Special Requests NONE   Final   Culture  Setup Time 10/31/2012 21:48   Final   Culture     Final   Value:        BLOOD CULTURE RECEIVED NO GROWTH TO DATE CULTURE WILL BE HELD FOR 5 DAYS BEFORE ISSUING A FINAL NEGATIVE REPORT   Report Status PENDING   Incomplete  CULTURE, BLOOD (ROUTINE X 2)     Status: None   Collection Time    10/31/12  4:23 PM      Result Value Range Status   Specimen Description BLOOD LEFT HAND  2 ML IN [facility] BOTTLE   Final   Special Requests NONE   Final   Culture  Setup Time 10/31/2012 21:48   Final   Culture     Final   Value:        BLOOD CULTURE RECEIVED NO GROWTH TO DATE CULTURE WILL BE HELD FOR 5 DAYS BEFORE ISSUING A FINAL NEGATIVE REPORT   Report Status PENDING   Incomplete    Anti-infectives   Start     Dose/Rate Route Frequency Ordered Stop   11/03/12 0600  vancomycin (VANCOCIN)  750 mg in sodium chloride 0.9 % 150 mL IVPB     750 mg 150 mL/hr over 60 Minutes Intravenous Every 12 hours 11/02/12 1824 11/08/12 1759   11/02/12 2200  ceFEPIme (MAXIPIME) 1 g in dextrose 5 % 50 mL IVPB     1 g 100 mL/hr over 30 Minutes Intravenous Every 12 hours 11/02/12 1345     11/02/12 1700  levofloxacin (LEVAQUIN) tablet 750 mg     750 mg Oral Every 48 hours 11/02/12 1347     11/01/12 1300  ceFEPIme (MAXIPIME) 1 g in dextrose 5 % 50 mL IVPB  Status:  Discontinued     1 g 100 mL/hr over 30 Minutes Intravenous Every 12 hours 11/01/12 1115 11/02/12 1345   10/31/12 1800  vancomycin (VANCOCIN) 500 mg in sodium chloride 0.9 % 100 mL IVPB  Status:  Discontinued     500 mg 100 mL/hr over 60 Minutes Intravenous Every 12 hours 10/31/12 1444 11/02/12 1826   10/31/12 1530  aztreonam (AZACTAM) 2 g in dextrose 5 % 50 mL IVPB  Status:  Discontinued  2 g 100 mL/hr over 30 Minutes Intravenous 3 times per day 10/31/12 1522 10/31/12 1542   10/31/12 1530  levofloxacin (LEVAQUIN) IVPB 750 mg  Status:  Discontinued     750 mg 100 mL/hr over 90 Minutes Intravenous Every 24 hours 10/31/12 1522 10/31/12 1542   10/31/12 1500  aztreonam (AZACTAM) 2 g in dextrose 5 % 50 mL IVPB  Status:  Discontinued     2 g 100 mL/hr over 30 Minutes Intravenous 3 times per day 10/31/12 1416 11/01/12 1115   10/31/12 1500  levofloxacin (LEVAQUIN) IVPB 750 mg  Status:  Discontinued     750 mg 100 mL/hr over 90 Minutes Intravenous Every 48 hours 10/31/12 1443 11/02/12 1347   10/31/12 1430  levofloxacin (LEVAQUIN) IVPB 750 mg  Status:  Discontinued     750 mg 100 mL/hr over 90 Minutes Intravenous Every 24 hours 10/31/12 1416 10/31/12 1443      Assessment: 79 yo female resident of ALF, admitted 3/22 with presumed HCAP. Day #3 Vancomycin/Levaquin, #2 Cefepime  Afebrile, WBC rising on steroids, SCr improved slightly Vanc trough = 12.3 on Vanc 500mg  IV q 12 hours Goal of Therapy:  Vancomycin trough level 15-20  mcg/ml  Plan:  Increased to Vancomycin 750mg  IV q 12 hours Monitor renal function in this elderly patient. Measure antibiotic drug levels at steady state Follow up culture results  Loletta Specter 11/02/2012,6:29 PM

## 2012-11-02 NOTE — Progress Notes (Signed)
[facility] PROGRESS NOTE  Shenna Brissette [name]:[id number] DOB: [birth year] DOA: 10/31/2012 PCP: Florentina Jenny, MD  Assessment/Plan: Acute asthma exacerbation  -Continue Solu-Medrol 60 mg IV every 6 hours  -Continue albuterol every 6 hours  -Incentive spirometry  -Continue Singulair  HCAP -Concerned about healthcare associated pneumonia  -Discontinued aztreonam  -Continue vancomycin, cefepime, levofloxacin  -Repeat chest x-ray in the morning after intravenous fluids to clarify infiltrate--->increasing RLL opacity -Change Levaquin to oral formulation Elevated proBNP  -Patient appears slightly hypovolemic  -Does not appear volume overloaded  -Judicious fluids  -Likely due to CKD  -Echocardiogram  CKD stage III  -Baseline creatinine 1.2-1.3  -Continue IV fluids  Dementia  -Continue Aricept  -Continue Depakote  Hypertension  -uncontrolled-->increase metoprolol dose to 25 mg bid -Continue losartan and metoprolol tartrate  Hyperlipidemia  -Continue statins  -Patient was taking Crestor    Family Communication:   Pt at beside Disposition Plan:   SNF when medically stable    Antibiotics:  Vancomycin 10/31/2012>>>  Levofloxacin 10/31/2012>>>  Cefepime 11/01/2012>>>  Aztreonam 10/31/2012>>> 11/01/2012    Procedures/Studies: Dg Chest 1 View  10/31/2012  *RADIOLOGY REPORT*  Clinical Data: Confusion, discomfort  CHEST - 1 VIEW  Comparison: 05/12/2011  Findings: Cardiomediastinal silhouette is stable.  No pulmonary edema.  Hazy right basilar atelectasis or infiltrate.  IMPRESSION: No pulmonary edema.  Hazy right basilar atelectasis or infiltrate.   Original Report Authenticated By: Natasha Mead, M.D.    Dg Chest Port 1 View  11/02/2012  *RADIOLOGY REPORT*  Clinical Data: Cough and congestion  PORTABLE CHEST - 1 VIEW  Comparison: 10/31/2012  Findings: 0520 hours.  The patient rotated slightly to the right. There is increased volume loss in the right base in the interval  since the prior study.  Associated right base collapse / consolidation noted.  Left lung is clear.  Degenerative changes noted in the right shoulder. Telemetry leads overlie the chest.  IMPRESSION: Worsening collapse / consolidation with volume loss in the right lower chest.   Original Report Authenticated By: Kennith Center, M.D.          Subjective: Patient is pleasantly confused. She denies fevers, chills, chest pain, shortness of breath, nausea, vomiting, diarrhea, abdominal pain. She feels that her coughing and shortness of breath or low but better.  Objective: Filed Vitals:   11/01/12 1920 11/01/12 2124 11/02/12 0634 11/02/12 0845  BP:  171/78 189/98   Pulse:  74 77   Temp:  98 F (36.7 C) 98.1 F (36.7 C)   TempSrc:  Axillary Axillary   Resp:  18 20   Height:      Weight:      SpO2: 97% 96% 98% 93%    Intake/Output Summary (Last 24 hours) at 11/02/12 1335 Last data filed at 11/02/12 0943  Gross per 24 hour  Intake    240 ml  Output      0 ml  Net    240 ml   Weight change:  Exam:   General:  Pt is alert, follows commands appropriately, not in acute distress  HEENT: No icterus, No thrush,  [place]/AT  Cardiovascular: RRR, S1/S2, no rubs, no gallops  Respiratory: Poor inspiratory effort. Bilateral expiratory wheeze. Bibasilar crackles. Diminished breath sounds right base.  Abdomen: Soft/+BS, non tender, non distended, no guarding  Extremities: No edema, No lymphangitis, No petechiae, No rashes, no synovitis  Data Reviewed: Basic Metabolic Panel:  Recent Labs Lab 10/31/12 1317 11/01/12 0545 11/02/12 0449  NA 143 143 144  K 3.6  3.5 3.6  CL 104 106 110  CO2 27 25 25   GLUCOSE 116* 140* 131*  BUN 30* 30* 33*  CREATININE 1.21* 1.15* 1.10  CALCIUM 9.4 9.0 8.6   Liver Function Tests:  Recent Labs Lab 10/31/12 1317  AST 18  ALT 9  ALKPHOS 49  BILITOT 0.3  PROT 6.7  ALBUMIN 2.9*    Recent Labs Lab 10/31/12 1317  LIPASE 28   No results found for  this basename: AMMONIA,  in the last 168 hours CBC:  Recent Labs Lab 10/31/12 1317 11/01/12 0545 11/02/12 0449  WBC 6.5 7.3 11.2*  NEUTROABS 5.2  --   --   HGB 11.4* 10.1* 10.3*  HCT 35.5* 29.6* 30.8*  MCV 92.9 90.5 90.1  PLT 159 150 163   Cardiac Enzymes:  Recent Labs Lab 10/31/12 1317  TROPONINI <0.30   BNP: No components found with this basename: POCBNP,  CBG: No results found for this basename: GLUCAP,  in the last 168 hours  Recent Results (from the past 240 hour(s))  CULTURE, BLOOD (ROUTINE X 2)     Status: None   Collection Time    10/31/12  4:10 PM      Result Value Range Status   Specimen Description BLOOD LEFT ARM  2 ML IN [facility] BOTTLE   Final   Special Requests NONE   Final   Culture  Setup Time 10/31/2012 21:48   Final   Culture     Final   Value:        BLOOD CULTURE RECEIVED NO GROWTH TO DATE CULTURE WILL BE HELD FOR 5 DAYS BEFORE ISSUING A FINAL NEGATIVE REPORT   Report Status PENDING   Incomplete  CULTURE, BLOOD (ROUTINE X 2)     Status: None   Collection Time    10/31/12  4:23 PM      Result Value Range Status   Specimen Description BLOOD LEFT HAND  2 ML IN [facility] BOTTLE   Final   Special Requests NONE   Final   Culture  Setup Time 10/31/2012 21:48   Final   Culture     Final   Value:        BLOOD CULTURE RECEIVED NO GROWTH TO DATE CULTURE WILL BE HELD FOR 5 DAYS BEFORE ISSUING A FINAL NEGATIVE REPORT   Report Status PENDING   Incomplete     Scheduled Meds: . sodium chloride   Intravenous Once  . albuterol  2.5 mg Nebulization TID  . aspirin EC  81 mg Oral QODAY  . atorvastatin  20 mg Oral q1800  . ceFEPime (MAXIPIME) IV  1 g Intravenous Q12H  . cholecalciferol  1,000 Units Oral Daily  . divalproex  500 mg Oral BID  . docusate sodium  100 mg Oral BID  . donepezil  10 mg Oral QHS  . ferrous sulfate  325 mg Oral BID  . folic acid  1 mg Oral QODAY  . heparin  5,000 Units Subcutaneous Q8H  . levofloxacin (LEVAQUIN) IV  750 mg Intravenous Q48H   . LORazepam  0.25 mg Oral BID  . losartan  100 mg Oral Daily  . methylPREDNISolone (SOLU-MEDROL) injection  60 mg Intravenous Q6H  . metoprolol tartrate  12.5 mg Oral BID  . montelukast  10 mg Oral QHS  . pantoprazole  40 mg Oral Daily  . vancomycin  500 mg Intravenous Q12H   Continuous Infusions:    Aquita Simmering, DO  [facility] Pager [phone]  If 7PM-7AM,  please contact night-coverage [email or web address] Password [facility] 11/02/2012, 1:35 PM   LOS: 2 days

## 2012-11-02 NOTE — Progress Notes (Signed)
ANTIBIOTIC CONSULT NOTE - FOLLOW UP  Pharmacy Consult for Vancomycin/Cefepime/Levaquin Indication: HCAP, UTI  No Active Allergies  Patient Measurements: Height: 5\' 1"  (154.9 cm) Weight: 135 lb (61.236 kg) IBW/kg (Calculated) : 47.8  Vital Signs: Temp: 98.1 F (36.7 C) (03/24 0634) Temp src: Axillary (03/24 0634) BP: 189/98 mmHg (03/24 0634) Pulse Rate: 77 (03/24 0634) Intake/Output from previous day: 03/23 0701 - 03/24 0700 In: 1324 [P.O.:1324] Out: -  Intake/Output from this shift: Total I/O In: 120 [P.O.:120] Out: -   Labs:  Recent Labs  10/31/12 1317 11/01/12 0545 11/02/12 0449  WBC 6.5 7.3 11.2*  HGB 11.4* 10.1* 10.3*  PLT 159 150 163  CREATININE 1.21* 1.15* 1.10   Estimated Creatinine Clearance: 34.8 ml/min (by C-G formula based on Cr of 1.1). 44 ml/min/1.21m2 (normalized)  Microbiology: Recent Results (from the past 720 hour(s))  CULTURE, BLOOD (ROUTINE X 2)     Status: None   Collection Time    10/31/12  4:10 PM      Result Value Range Status   Specimen Description BLOOD LEFT ARM  2 ML IN [facility] BOTTLE   Final   Special Requests NONE   Final   Culture  Setup Time 10/31/2012 21:48   Final   Culture     Final   Value:        BLOOD CULTURE RECEIVED NO GROWTH TO DATE CULTURE WILL BE HELD FOR 5 DAYS BEFORE ISSUING A FINAL NEGATIVE REPORT   Report Status PENDING   Incomplete  CULTURE, BLOOD (ROUTINE X 2)     Status: None   Collection Time    10/31/12  4:23 PM      Result Value Range Status   Specimen Description BLOOD LEFT HAND  2 ML IN [facility] BOTTLE   Final   Special Requests NONE   Final   Culture  Setup Time 10/31/2012 21:48   Final   Culture     Final   Value:        BLOOD CULTURE RECEIVED NO GROWTH TO DATE CULTURE WILL BE HELD FOR 5 DAYS BEFORE ISSUING A FINAL NEGATIVE REPORT   Report Status PENDING   Incomplete    Anti-infectives   Start     Dose/Rate Route Frequency Ordered Stop   11/01/12 1300  ceFEPIme (MAXIPIME) 1 g in dextrose 5 % 50 mL IVPB      1 g 100 mL/hr over 30 Minutes Intravenous Every 12 hours 11/01/12 1115     10/31/12 1800  vancomycin (VANCOCIN) 500 mg in sodium chloride 0.9 % 100 mL IVPB     500 mg 100 mL/hr over 60 Minutes Intravenous Every 12 hours 10/31/12 1444 11/08/12 1759   10/31/12 1530  aztreonam (AZACTAM) 2 g in dextrose 5 % 50 mL IVPB  Status:  Discontinued     2 g 100 mL/hr over 30 Minutes Intravenous 3 times per day 10/31/12 1522 10/31/12 1542   10/31/12 1530  levofloxacin (LEVAQUIN) IVPB 750 mg  Status:  Discontinued     750 mg 100 mL/hr over 90 Minutes Intravenous Every 24 hours 10/31/12 1522 10/31/12 1542   10/31/12 1500  aztreonam (AZACTAM) 2 g in dextrose 5 % 50 mL IVPB  Status:  Discontinued     2 g 100 mL/hr over 30 Minutes Intravenous 3 times per day 10/31/12 1416 11/01/12 1115   10/31/12 1500  levofloxacin (LEVAQUIN) IVPB 750 mg     750 mg 100 mL/hr over 90 Minutes Intravenous Every 48 hours 10/31/12 1443  11/04/12 1459   10/31/12 1430  levofloxacin (LEVAQUIN) IVPB 750 mg  Status:  Discontinued     750 mg 100 mL/hr over 90 Minutes Intravenous Every 24 hours 10/31/12 1416 10/31/12 1443      Assessment:  79 yo female resident of ALF, admitted 3/22 with presumed HCAP.  Day #3 Vancomycin/Levaquin, #2 Cefepime   Afebrile, WBC rising on steroids, SCr improved slightly  Blood cultures negative to date, CXR today shows worsening collapse / consolidation with volume loss in the right lower chest compared to 3/22  Goal of Therapy:  Vancomycin trough level 15-20 mcg/ml Doses per weight and renal function  Plan:   Check Vancomycin trough tonight  Continue Cefepime 1gm IV q12h  Last dose of Levaquin today to complete 3 days total  Loralee Pacas, PharmD, BCPS Pager: [phone] 11/02/2012,10:41 AM

## 2012-11-02 NOTE — Progress Notes (Signed)
  Echocardiogram 2D Echocardiogram has been performed.  Cathie Beams 11/02/2012, 11:46 AM

## 2012-11-03 LAB — CBC
MCH: 30 pg (ref 26.0–34.0)
MCHC: 33.8 g/dL (ref 30.0–36.0)
MCV: 88.9 fL (ref 78.0–100.0)
Platelets: 178 10*3/uL (ref 150–400)
RDW: 12.6 % (ref 11.5–15.5)

## 2012-11-03 LAB — BASIC METABOLIC PANEL
BUN: 31 mg/dL — ABNORMAL HIGH (ref 6–23)
CO2: 27 mEq/L (ref 19–32)
Calcium: 8.6 mg/dL (ref 8.4–10.5)
Creatinine, Ser: 1 mg/dL (ref 0.50–1.10)
Glucose, Bld: 123 mg/dL — ABNORMAL HIGH (ref 70–99)

## 2012-11-03 MED ORDER — METHYLPREDNISOLONE SODIUM SUCC 125 MG IJ SOLR
60.0000 mg | Freq: Two times a day (BID) | INTRAMUSCULAR | Status: DC
  Administered 2012-11-03: 60 mg via INTRAVENOUS
  Filled 2012-11-03 (×3): qty 0.96

## 2012-11-03 NOTE — Progress Notes (Signed)
[facility] PROGRESS NOTE  Ava Tangney EAV:[id number] DOB: 20-Sep-1930 DOA: 10/31/2012 PCP: Florentina Jenny, MD  BRIEF HISTORY 79 year old female with a history of asthma, dementia, hypertension, hyperlipidemia, paroxysmal atrial fibrillation, and coronary artery disease presents with a one-week history of cough and congestion. The patient had a fever of 102.22F one week prior to admission. The patient was started on an unknown antibiotic. There is no improvement. The patient continued to have low-grade fevers up to 101F. However, she has been afebrile since admission to the hospital. There has been no hemoptysis, nausea, vomiting, diarrhea, rashes. Workup in the emergency department revealed WBC 7.3, BUN 30, creatinine 1.21, tachycardia up to 120, oxygen saturation 91-98% on room air. ProBNP was 736. Urinalysis was unremarkable. Initial Chest x-ray shows right lower lobe haziness. The patient was started on vancomycin, aztreonam, and Levaquin. Aztreonam was switched to cefepime after clarifying that the patient's penicillin allergy was unclear.  Repeat chest x-ray on 11/02/2012 showed a more distinct increase in her right lower lobe opacity confirming clinical suspicion for healthcare associated pneumonia. The patient was continued on her vancomycin, cefepime, and levofloxacin. Because of her wheezing, the patient was continued on Solu-Medrol 60 mg IV every 6 hours. She did not have any respiratory distress or desaturation. Echocardiogram showed EF 60-65%, no regional wall abnormality and mildly increased pulmonary arterial pressure. The patient's uncontrolled blood pressure, the patient's metoprolol was increased to 25 mg twice a day and her losartan was continued on 100 mg daily.  Assessment/Plan: Acute asthma exacerbation  -Decrease Solu-Medrol to 60 mg every 12 hours -Continue albuterol every 6 hours  -Incentive spirometry  -Continue Singulair  -Patient's "wheezing" is more upper airway  generated/vocal cord generated -Minimal to no wheezing on lung auscultation HCAP  -Concerned about healthcare associated pneumonia  -Discontinued aztreonam  -Continue cefepime, levofloxacin  -Discontinue vancomycin, blood cultures negative to date -Repeated chest x-ray after 36hrs IVF to clarify infiltrate--->increasing RLL opacity  -Change Levaquin to oral formulation  Elevated proBNP  -Patient appears slightly hypovolemic  -Does not appear volume overloaded  -Judicious fluids  -Likely due to CKD  -Echocardiogram--EF 60-65%, no wall motion abnormality, mildly dilated RA,RV CKD stage III  -Baseline creatinine 1.2-1.3  -Continue IV fluids  Dementia  -Continue Aricept  -Continue Depakote  Hypertension  -uncontrolled-->increase metoprolol dose to 25 mg bid  -Continue losartan and metoprolol tartrate  Hyperlipidemia  -Continue statins  -Patient was taking Crestor  Family Communication: Family at beside  Disposition Plan: SNF when medically stable  Antibiotics:  Vancomycin 10/31/2012>>> 11/03/12 Levofloxacin 10/31/2012>>>  Cefepime 11/01/2012>>>  Aztreonam 10/31/2012>>> 11/01/2012          Procedures/Studies: Dg Chest 1 View  10/31/2012  *RADIOLOGY REPORT*  Clinical Data: Confusion, discomfort  CHEST - 1 VIEW  Comparison: 05/12/2011  Findings: Cardiomediastinal silhouette is stable.  No pulmonary edema.  Hazy right basilar atelectasis or infiltrate.  IMPRESSION: No pulmonary edema.  Hazy right basilar atelectasis or infiltrate.   Original Report Authenticated By: Natasha Mead, M.D.    Dg Chest Port 1 View  11/02/2012  *RADIOLOGY REPORT*  Clinical Data: Cough and congestion  PORTABLE CHEST - 1 VIEW  Comparison: 10/31/2012  Findings: 0520 hours.  The patient rotated slightly to the right. There is increased volume loss in the right base in the interval since the prior study.  Associated right base collapse / consolidation noted.  Left lung is clear.  Degenerative changes noted  in the right shoulder. Telemetry leads overlie the chest.  IMPRESSION: Worsening collapse /  consolidation with volume loss in the right lower chest.   Original Report Authenticated By: Kennith Center, M.D.          Subjective: Patient states that her breathing and coughing are improving. Denies fevers, chills, chest pain, abdominal pain, vomiting, diarrhea. She is eating well.  Objective: Filed Vitals:   11/02/12 2158 11/02/12 2200 11/03/12 0600 11/03/12 0803  BP:  156/98 160/94   Pulse:  96 98   Temp:  98.2 F (36.8 C) 98.4 F (36.9 C)   TempSrc:  Oral Oral   Resp:  18 18   Height:      Weight:      SpO2: 98% 97% 98% 97%    Intake/Output Summary (Last 24 hours) at 11/03/12 1244 Last data filed at 11/02/12 1519  Gross per 24 hour  Intake    120 ml  Output      0 ml  Net    120 ml   Weight change:  Exam:   General:  Pt is alert, follows commands appropriately, not in acute distress  HEENT: No icterus, No thrush,  [place]/AT  Cardiovascular: RRR, S1/S2, no rubs, no gallops  Respiratory: Minimal expiratory wheeze. Significant upper airway/vocal cord adventitial sounds  Abdomen: Soft/+BS, non tender, non distended, no guarding  Extremities: No edema, No lymphangitis, No petechiae, No rashes, no synovitis  Data Reviewed: Basic Metabolic Panel:  Recent Labs Lab 10/31/12 1317 11/01/12 0545 11/02/12 0449 11/03/12 0520  NA 143 143 144 139  K 3.6 3.5 3.6 3.7  CL 104 106 110 105  CO2 27 25 25 27   GLUCOSE 116* 140* 131* 123*  BUN 30* 30* 33* 31*  CREATININE 1.21* 1.15* 1.10 1.00  CALCIUM 9.4 9.0 8.6 8.6   Liver Function Tests:  Recent Labs Lab 10/31/12 1317  AST 18  ALT 9  ALKPHOS 49  BILITOT 0.3  PROT 6.7  ALBUMIN 2.9*    Recent Labs Lab 10/31/12 1317  LIPASE 28   No results found for this basename: AMMONIA,  in the last 168 hours CBC:  Recent Labs Lab 10/31/12 1317 11/01/12 0545 11/02/12 0449 11/03/12 0520  WBC 6.5 7.3 11.2* 11.2*   NEUTROABS 5.2  --   --   --   HGB 11.4* 10.1* 10.3* 10.8*  HCT 35.5* 29.6* 30.8* 32.0*  MCV 92.9 90.5 90.1 88.9  PLT 159 150 163 178   Cardiac Enzymes:  Recent Labs Lab 10/31/12 1317  TROPONINI <0.30   BNP: No components found with this basename: POCBNP,  CBG: No results found for this basename: GLUCAP,  in the last 168 hours  Recent Results (from the past 240 hour(s))  CULTURE, BLOOD (ROUTINE X 2)     Status: None   Collection Time    10/31/12  4:10 PM      Result Value Range Status   Specimen Description BLOOD LEFT ARM  2 ML IN [facility] BOTTLE   Final   Special Requests NONE   Final   Culture  Setup Time 10/31/2012 21:48   Final   Culture     Final   Value:        BLOOD CULTURE RECEIVED NO GROWTH TO DATE CULTURE WILL BE HELD FOR 5 DAYS BEFORE ISSUING A FINAL NEGATIVE REPORT   Report Status PENDING   Incomplete  CULTURE, BLOOD (ROUTINE X 2)     Status: None   Collection Time    10/31/12  4:23 PM      Result Value Range Status  Specimen Description BLOOD LEFT HAND  2 ML IN [facility] BOTTLE   Final   Special Requests NONE   Final   Culture  Setup Time 10/31/2012 21:48   Final   Culture     Final   Value:        BLOOD CULTURE RECEIVED NO GROWTH TO DATE CULTURE WILL BE HELD FOR 5 DAYS BEFORE ISSUING A FINAL NEGATIVE REPORT   Report Status PENDING   Incomplete     Scheduled Meds: . sodium chloride   Intravenous Once  . albuterol  2.5 mg Nebulization TID  . aspirin EC  81 mg Oral QODAY  . atorvastatin  20 mg Oral q1800  . ceFEPime (MAXIPIME) IV  1 g Intravenous Q12H  . cholecalciferol  1,000 Units Oral Daily  . divalproex  500 mg Oral BID  . docusate sodium  100 mg Oral BID  . donepezil  10 mg Oral QHS  . ferrous sulfate  325 mg Oral BID  . folic acid  1 mg Oral QODAY  . heparin  5,000 Units Subcutaneous Q8H  . levofloxacin  750 mg Oral Q48H  . LORazepam  0.25 mg Oral BID  . losartan  100 mg Oral Daily  . methylPREDNISolone (SOLU-MEDROL) injection  60 mg Intravenous Q12H   . metoprolol tartrate  25 mg Oral BID  . montelukast  10 mg Oral QHS  . pantoprazole  40 mg Oral Daily   Continuous Infusions:    Isom Kochan, DO  [facility] Pager [phone]  If 7PM-7AM, please contact night-coverage [email or web address] Password [facility] 11/03/2012, 12:44 PM   LOS: 3 days

## 2012-11-03 NOTE — Progress Notes (Signed)
Physical Therapy Treatment Patient Details Name: [name] MRN: [id number] DOB: 10-13-1930 Today's Date: 11/03/2012 Time: 4098-1191 PT Time Calculation (min): 27 min  PT Assessment / Plan / Recommendation Comments on Treatment Session  Pt pleasantly confused and requires MAX direction.  Pt incont of urine so assisted to [facility] weith MAX cueing for hand placement and turn completion.  Amb pt in the hallway with max tactile cueing to advance gait as pt would easily get distarcted and stop.  Spouse stated pt amb self to and from the dinning room at her facility prior.    Follow Up Recommendations  SNF;Supervision/Assistance - 24 hour     Does the patient have the potential to tolerate intense rehabilitation     Barriers to Discharge        Equipment Recommendations  None recommended by PT    Recommendations for Other Services    Frequency Min 3X/week   Plan      Precautions / Restrictions Precautions Precautions: Fall Precaution Comments: Hx dementi Restrictions Weight Bearing Restrictions: No   Pertinent Vitals/Pain No c/o pain    Mobility  Bed Mobility Bed Mobility: Supine to Sit Supine to Sit: 3: Mod assist;2: Max assist;HOB elevated Details for Bed Mobility Assistance: Pt requires assist with LEs out of bed and for trunk to attain sitting position.  MAX cues for encouragement from therapist and daughter to participate in therapy.  Transfers Transfers: Sit to Stand;Stand to Sit Sit to Stand: 3: Mod assist;With upper extremity assist;From bed;2: Max assist Stand to Sit: With upper extremity assist;With armrests;To chair/3-in-1;2: Max assist Details for Transfer Assistance: Max VC's and tactile cueing for direction and turn completion.  Pt had difficulty initiating mvt and required repeat funtion cueing to complete task. Ambulation/Gait Ambulation/Gait Assistance: 3: Mod assist Ambulation Distance (Feet): 70 Feet (35' x 2 one sitting rest break) Assistive device:  Rolling walker Ambulation/Gait Assistance Details: Pt required max encouragement to advance gait and was easily distracted.  Severe posterior lean and forward flex posture with tendancy to push RW out too far. Pt required tactile cueing to keep moving but then pt repeated, "stoppushing me". HIGH FALL RISK. Impaired safety cognition.  Gait Pattern: Step-to pattern;Step-through pattern;Decreased stride length;Shuffle;Narrow base of support;Trunk flexed Gait velocity: decreased     PT Goals                                progressing    Visit Information  Last PT Received On: 11/03/12 Assistance Needed: +2    Subjective Data      Cognition    impaired   Balance   poor  End of Session PT - End of Session Equipment Utilized During Treatment: Gait belt Activity Tolerance: Patient limited by fatigue;Treatment limited secondary to agitation Patient left: in chair;with call bell/phone within reach;with family/visitor present Nurse Communication: Mobility status   Felecia Shelling  PTA [facility]  Acute  Rehab Pager      [phone]

## 2012-11-03 NOTE — Progress Notes (Signed)
OT Cancellation Note  Patient Details Name: [name] MRN: [id number] DOB: Mar 25, 1931   Cancelled Treatment:     Reason Eval not completed: Pt is 79 y/o female whom presents w/ HCAP & h/o dementia. She is from ALF [facility]) and resides on memory care unit. Per chart review, pt required assistance from personal care attendant for ADL's to include bathing, dressing & toileting prior to this admission. Per acute nursing staff, plan is for pt to return to ALF. Will therefore defer OT eval/assessment to ALF/SND at this time. Will sign off acute OT.  Mariam Dollar Beth Dixon 11/03/2012, 1:22 PM

## 2012-11-04 DIAGNOSIS — I10 Essential (primary) hypertension: Secondary | ICD-10-CM

## 2012-11-04 LAB — BASIC METABOLIC PANEL
CO2: 26 mEq/L (ref 19–32)
Calcium: 8.6 mg/dL (ref 8.4–10.5)
Creatinine, Ser: 1.13 mg/dL — ABNORMAL HIGH (ref 0.50–1.10)

## 2012-11-04 MED ORDER — IPRATROPIUM BROMIDE 0.02 % IN SOLN
0.5000 mg | Freq: Three times a day (TID) | RESPIRATORY_TRACT | Status: DC
  Administered 2012-11-04 – 2012-11-05 (×4): 0.5 mg via RESPIRATORY_TRACT
  Filled 2012-11-04 (×4): qty 2.5

## 2012-11-04 MED ORDER — HYDRALAZINE HCL 20 MG/ML IJ SOLN: 10.0000 mg | INTRAMUSCULAR | Status: DC | PRN

## 2012-11-04 MED ORDER — LABETALOL HCL 200 MG PO TABS
200.0000 mg | ORAL_TABLET | Freq: Two times a day (BID) | ORAL | Status: DC
  Administered 2012-11-04: 200 mg via ORAL
  Filled 2012-11-04 (×2): qty 1

## 2012-11-04 MED ORDER — METOPROLOL TARTRATE 50 MG PO TABS
50.0000 mg | ORAL_TABLET | Freq: Two times a day (BID) | ORAL | Status: DC
  Administered 2012-11-04 – 2012-11-05 (×2): 50 mg via ORAL
  Filled 2012-11-04 (×3): qty 1

## 2012-11-04 MED ORDER — CLONIDINE HCL 0.1 MG PO TABS
0.1000 mg | ORAL_TABLET | Freq: Three times a day (TID) | ORAL | Status: DC
  Administered 2012-11-04 – 2012-11-05 (×5): 0.1 mg via ORAL
  Filled 2012-11-04 (×7): qty 1

## 2012-11-04 MED ORDER — CLONIDINE HCL 0.1 MG PO TABS
0.1000 mg | ORAL_TABLET | ORAL | Status: AC
Start: 2012-11-04 — End: 2012-11-04
  Administered 2012-11-04: 0.1 mg via ORAL
  Filled 2012-11-04: qty 1

## 2012-11-04 MED ORDER — PREDNISONE 50 MG PO TABS
60.0000 mg | ORAL_TABLET | Freq: Every day | ORAL | Status: DC
  Administered 2012-11-04 – 2012-11-05 (×2): 60 mg via ORAL
  Filled 2012-11-04 (×3): qty 1

## 2012-11-04 NOTE — Progress Notes (Signed)
Patient's blood pressure running high in the PM. Notified NP on call, received one time order for catapres 0.1 mg. BP high this AM, orders placed for catapres 0.1mg  TID scheduled.

## 2012-11-04 NOTE — Progress Notes (Addendum)
[facility] PROGRESS NOTE  Kenyatta Gloeckner [name]:[id number] DOB: Dec 30, 1930 DOA: 10/31/2012 PCP: Florentina Jenny, MD  Assessment/Plan: Acute asthma exacerbation.  Lost IV access this morning.  Was planning to transition to oral steroids today anyway.  Had O2 saturation of 84% this morning on room air, however, has been in the mid to upper 90s for the most part since yesterday.     -Prednisone 60mg  once daily -Continue albuterol every 6 hours  -Incentive spirometry  -Continue Singulair  -Agree that "wheezing" is more upper airway generated/vocal cord generated -Minimal to no wheezing on lung auscultation  HCAP  -Concerned about healthcare associated pneumonia  -Discontinued aztreonam  -d/c cefepime - Continue levofloxacin  -Discontinue vancomycin 3/25  - blood cultures negative to date  - s. pneumo neg.  Legionella.  -Repeated chest x-ray after 36hrs IVF to clarify infiltrate--->increasing RLL opacity (however, patient clinically improving)  Elevated proBNP  -Does not appear volume overloaded  -Likely due to CKD  -Echocardiogram--EF 60-65%, no wall motion abnormality, mildly dilated RA,RV  CKD stage III  -Baseline creatinine 1.2-1.3  -d/c IVF  Dementia  -Continue Aricept  -Continue Depakote   Hypertension severe uncontrolled asymptomatic and without end-organ damage.  May be elevated in part due to steroids.   - Tried some labetalol this AM, but no change in BP - Will continue selective BB, increase metoprolol dose to 50 mg bid  - Continue losartan - Clonidine added overnight -  Add hydralazine prn   Hyperlipidemia  -Continue statins  -Patient was taking Crestor   Leukocytosis:  Likely secondary to steroids.  Has been on broad spectrum abx due to HCAP.    Family Communication: Husband at beside  Disposition Plan: SNF when medically stable.  Seen by PT/OT.    Antibiotics:  Vancomycin 10/31/2012>>> 11/03/12 Levofloxacin 10/31/2012>>>  Cefepime 11/01/2012>>>  11/04/2012 (completed 5 days of IV abx) Aztreonam 10/31/2012>>> 11/01/2012    Procedures/Studies: Dg Chest 1 View  10/31/2012  *RADIOLOGY REPORT*  Clinical Data: Confusion, discomfort  CHEST - 1 VIEW  Comparison: 05/12/2011  Findings: Cardiomediastinal silhouette is stable.  No pulmonary edema.  Hazy right basilar atelectasis or infiltrate.  IMPRESSION: No pulmonary edema.  Hazy right basilar atelectasis or infiltrate.   Original Report Authenticated By: Natasha Mead, M.D.    Dg Chest Port 1 View  11/02/2012  *RADIOLOGY REPORT*  Clinical Data: Cough and congestion  PORTABLE CHEST - 1 VIEW  Comparison: 10/31/2012  Findings: 0520 hours.  The patient rotated slightly to the right. There is increased volume loss in the right base in the interval since the prior study.  Associated right base collapse / consolidation noted.  Left lung is clear.  Degenerative changes noted in the right shoulder. Telemetry leads overlie the chest.  IMPRESSION: Worsening collapse / consolidation with volume loss in the right lower chest.   Original Report Authenticated By: Kennith Center, M.D.          Subjective: Patient states that her breathing is better, but still has a rattling cough. Denies fevers, chills, chest pain, abdominal pain, vomiting, diarrhea. She is eating well.  Still very weak compared to baseline and her husband is currently feeding her.    Objective: Filed Vitals:   11/04/12 0055 11/04/12 0530 11/04/12 0648 11/04/12 1045  BP: 162/90 186/101  181/100  Pulse:  111  70  Temp:  98 F (36.7 C)    TempSrc:  Oral    Resp:  18    Height:      Weight:  SpO2:  84% 97%     Intake/Output Summary (Last 24 hours) at 11/04/12 1249 Last data filed at 11/04/12 0848  Gross per 24 hour  Intake      0 ml  Output      0 ml  Net      0 ml   Weight change:  Exam:   General:  Pt is alert, follows most commands appropriately, not in acute distress.    HEENT: No icterus, No thrush,   [place]/AT  Cardiovascular: RRR, S1/S2, no rubs, no gallops  Respiratory: Minimal expiratory wheeze. Significant upper airway/vocal cord adventitial sounds.  Rhonchi +, no rales.  No increased WOB  Abdomen: Soft/+BS, non tender, non distended, no guarding  Extremities: No edema, No lymphangitis, No petechiae, No rashes, no synovitis  Data Reviewed: Basic Metabolic Panel:  Recent Labs Lab 10/31/12 1317 11/01/12 0545 11/02/12 0449 11/03/12 0520 11/04/12 0453  NA 143 143 144 139 137  K 3.6 3.5 3.6 3.7 4.0  CL 104 106 110 105 103  CO2 27 25 25 27 26   GLUCOSE 116* 140* 131* 123* 141*  BUN 30* 30* 33* 31* 38*  CREATININE 1.21* 1.15* 1.10 1.00 1.13*  CALCIUM 9.4 9.0 8.6 8.6 8.6   Liver Function Tests:  Recent Labs Lab 10/31/12 1317  AST 18  ALT 9  ALKPHOS 49  BILITOT 0.3  PROT 6.7  ALBUMIN 2.9*    Recent Labs Lab 10/31/12 1317  LIPASE 28   No results found for this basename: AMMONIA,  in the last 168 hours CBC:  Recent Labs Lab 10/31/12 1317 11/01/12 0545 11/02/12 0449 11/03/12 0520  WBC 6.5 7.3 11.2* 11.2*  NEUTROABS 5.2  --   --   --   HGB 11.4* 10.1* 10.3* 10.8*  HCT 35.5* 29.6* 30.8* 32.0*  MCV 92.9 90.5 90.1 88.9  PLT 159 150 163 178   Cardiac Enzymes:  Recent Labs Lab 10/31/12 1317  TROPONINI <0.30   BNP: No components found with this basename: POCBNP,  CBG: No results found for this basename: GLUCAP,  in the last 168 hours  Recent Results (from the past 240 hour(s))  CULTURE, BLOOD (ROUTINE X 2)     Status: None   Collection Time    10/31/12  4:10 PM      Result Value Range Status   Specimen Description BLOOD LEFT ARM  2 ML IN [facility] BOTTLE   Final   Special Requests NONE   Final   Culture  Setup Time 10/31/2012 21:48   Final   Culture     Final   Value:        BLOOD CULTURE RECEIVED NO GROWTH TO DATE CULTURE WILL BE HELD FOR 5 DAYS BEFORE ISSUING A FINAL NEGATIVE REPORT   Report Status PENDING   Incomplete  CULTURE, BLOOD (ROUTINE X 2)      Status: None   Collection Time    10/31/12  4:23 PM      Result Value Range Status   Specimen Description BLOOD LEFT HAND  2 ML IN [facility] BOTTLE   Final   Special Requests NONE   Final   Culture  Setup Time 10/31/2012 21:48   Final   Culture     Final   Value:        BLOOD CULTURE RECEIVED NO GROWTH TO DATE CULTURE WILL BE HELD FOR 5 DAYS BEFORE ISSUING A FINAL NEGATIVE REPORT   Report Status PENDING   Incomplete     Scheduled Meds: .  albuterol  2.5 mg Nebulization TID  . aspirin EC  81 mg Oral QODAY  . atorvastatin  20 mg Oral q1800  . cholecalciferol  1,000 Units Oral Daily  . cloNIDine  0.1 mg Oral TID  . divalproex  500 mg Oral BID  . docusate sodium  100 mg Oral BID  . donepezil  10 mg Oral QHS  . ferrous sulfate  325 mg Oral BID  . folic acid  1 mg Oral QODAY  . heparin  5,000 Units Subcutaneous Q8H  . labetalol  200 mg Oral BID  . levofloxacin  750 mg Oral Q48H  . LORazepam  0.25 mg Oral BID  . losartan  100 mg Oral Daily  . montelukast  10 mg Oral QHS  . pantoprazole  40 mg Oral Daily  . predniSONE  60 mg Oral Q breakfast   Continuous Infusions:    Renae Fickle, MD  [facility] Pager [phone]  If 7PM-7AM, please contact night-coverage [email or web address] Password [facility] 11/04/2012, 12:49 PM   LOS: 4 days

## 2012-11-04 NOTE — Clinical Documentation Improvement (Signed)
Hypertension Documentation Clarification Query  THIS DOCUMENT IS NOT A PERMANENT PART OF THE MEDICAL RECORD  TO RESPOND TO THE THIS QUERY, FOLLOW THE INSTRUCTIONS BELOW:  1. If needed, update documentation for the patient's encounter via the notes activity.  2. Access this query again and click edit on the In [employer].  3. After updating, or not, click F2 to complete all highlighted (required) fields concerning your review. Select "additional documentation in the medical record" OR "no additional documentation provided".  4. Click Sign note button.  5. The deficiency will fall out of your In Basket *Please let us know if you are not able to complete this workflow by phone or e-mail (listed below).        11/04/12  Dear Dr. Arbutus Leas  Marton Redwood  In an effort to better capture your patient's severity of illness, reflect appropriate length of stay and utilization of resources, a review of the patient medical record has revealed the following indicators.    Based on your clinical judgment, please clarify and document in a progress note and/or discharge summary the clinical condition associated with the following supporting information:  In responding to this query please exercise your independent judgment.  The fact that a query is asked, does not imply that any particular answer is desired or expected.  Possible Clinical Conditions?   " Hypertension  " Accelerated Hypertension  " Malignant Hypertension  " Or Other Condition __________________________  " Cannot Clinically Determine   Supporting Information:  Risk Factors: CKD stage III, History of HTN  Signs and Symptoms: SBP range:  209/76 DBP range: 186/101  Confusion: 10/31/12 per ED:  Exhibits abnormal recent memory    Diagnostics: Troponin level: 10/31/12 results <0.30   Echo  :  11/03/12 Left ventricle: The cavity size was normal. There was mild focal basal hypertrophy of the septum. Systolic function was normal.  The estimated ejection fraction was in the range of 60% to 65%. Although no diagnostic regional wall motion abnormality was identified, this possibility cannot be completely excluded on the basis of this study  Treatment: Labetalol 200 mg  BID                     Losartan 100 mg daily   You may use possible, probable, or suspect with inpatient documentation. Possible, probable, suspected diagnoses MUST be documented at the time of discharge.  Reviewed: additional documentation in the medical record  Per Dr Malachi Bonds in addendum 11/04/12  Thank You,  Andy Gauss  Clinical Documentation Specialist:  Pager [phone] [facility] Health Information Management [place]
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
## 2012-11-04 NOTE — Progress Notes (Signed)
Loss of IV access. IV team paged, stuck twice and unable to gain new access. NP notified. Also attempted x2 to In and out cath patient for urine specimen. Patient refused stating, "Get away from me". In and out cath stopped and will attempt at later time.

## 2012-11-05 DIAGNOSIS — I10 Essential (primary) hypertension: Secondary | ICD-10-CM

## 2012-11-05 LAB — LEGIONELLA ANTIGEN, URINE: Legionella Antigen, Urine: NEGATIVE

## 2012-11-05 MED ORDER — CLONIDINE HCL 0.1 MG PO TABS: 0.1000 mg | ORAL_TABLET | Freq: Three times a day (TID) | ORAL | Status: DC

## 2012-11-05 MED ORDER — AMLODIPINE BESYLATE 5 MG PO TABS: 5.0000 mg | ORAL_TABLET | Freq: Every day | ORAL | Status: DC

## 2012-11-05 MED ORDER — AMLODIPINE BESYLATE 5 MG PO TABS
5.0000 mg | ORAL_TABLET | Freq: Every day | ORAL | Status: DC
  Administered 2012-11-05: 5 mg via ORAL
  Filled 2012-11-05: qty 1

## 2012-11-05 MED ORDER — PREDNISONE 10 MG PO TABS: ORAL_TABLET | ORAL | Status: DC

## 2012-11-05 MED ORDER — LEVOFLOXACIN 750 MG PO TABS: 750.0000 mg | ORAL_TABLET | ORAL | Status: DC

## 2012-11-05 MED ORDER — METOPROLOL TARTRATE 50 MG PO TABS: 50.0000 mg | ORAL_TABLET | Freq: Two times a day (BID) | ORAL | Status: DC

## 2012-11-05 NOTE — Progress Notes (Addendum)
Clinical Social Work Department BRIEF PSYCHOSOCIAL ASSESSMENT 11/05/2012  Patient:  [name], [name]     Account Number:  [id number]     Admit date:  10/31/2012  Clinical Social Worker:  Hattie Perch  Date/Time:  11/05/2012 12:00 M  Referred by:  Physician  Date Referred:  11/05/2012 Referred for  SNF Placement   Other Referral:   Interview type:  Family Other interview type:    PSYCHOSOCIAL DATA Living Status:  FACILITY Admitted from facility:  [facility] Level of care:  Assisted Living Primary support name:  [name] Primary support relationship to patient:  spouse Degree of support available:   good    CURRENT CONCERNS Current Concerns  Post-Acute Placement   Other Concerns:    SOCIAL WORK ASSESSMENT / PLAN Patient in need of snf placement and is unable to participate in assessment. CSW called patient's husband, Marin Comment, he is agreeable to CSW faxing patient out and recieving bed offers.   Assessment/plan status:   Other assessment/ plan:   Information/referral to community resources:    PATIENT'S/FAMILY'S RESPONSE TO PLAN OF CARE: agreeable to faxing patient out and recieving bed offers.

## 2012-11-05 NOTE — Progress Notes (Signed)
Physical Therapy Treatment Patient Details Name: [name] MRN: [id number] DOB: 1930-11-07 Today's Date: 11/05/2012 Time: 4098-1191 PT Time Calculation (min): 41 min  PT Assessment / Plan / Recommendation Comments on Treatment Session  Pt appears more confused and required more physical assist to get OOB to [facility] and to amb in hallway.  Pt not functionally using B UE's during transfer and required hand over hand cueing on RW.  Spouse stated earlier this week that prior pt amb to dinning room for meals.  Currently pt requires + 2 total assist with severe posterior lean and poor initiation even with MAX functional cueing.  Pt requires Total assist for hygiene.  Pt requires assistance for feeding.   Pt from ALF however at this current level pt will need SNF.    Follow Up Recommendations  SNF     Does the patient have the potential to tolerate intense rehabilitation     Barriers to Discharge        Equipment Recommendations  None recommended by PT    Recommendations for Other Services    Frequency Min 3X/week   Plan      Precautions / Restrictions Precautions Precautions: Fall Precaution Comments: Hx dementia Restrictions Weight Bearing Restrictions: No   Pertinent Vitals/Pain No signs of pain    Mobility  Bed Mobility Bed Mobility: Supine to Sit Supine to Sit: 1: +2 Total assist Details for Bed Mobility Assistance: Pt required increased assist and cueing to follow directions and perform task.  Transfers Transfers: Sit to Stand;Stand to Sit Sit to Stand: 1: +2 Total assist;From bed;From toilet Stand to Sit: 1: +2 Total assist;To chair/3-in-1;To toilet Details for Transfer Assistance: 100% VC's and tactile cueing to try to increase pt's self assist.  Pt made appropriate eye contact and verbally comuniccated effort but poor initiation and follow through. Ambulation/Gait Ambulation/Gait Assistance: 1: +2 Total assist Ambulation Distance (Feet): 48 Feet Assistive device:  Rolling walker Ambulation/Gait Assistance Details: Pt required increased assist and demon increased confusion with difficulty functionally ambulating.  Severe posterior pushing and total assist to advane RW. Chair following close behind for safety.  Gait Pattern: Step-to pattern;Step-through pattern;Decreased stride length;Shuffle;Narrow base of support;Trunk flexed Gait velocity: decreased    PT Goals                                                       Progressing slowly    Visit Information  Last PT Received On: 11/05/12 Assistance Needed: +2    Subjective Data      Cognition    impaired   Balance   poor  End of Session PT - End of Session Equipment Utilized During Treatment: Gait belt Activity Tolerance: Patient limited by fatigue;Other (comment) (decreased cognition)   Felecia Shelling  PTA [facility]  Acute  Rehab Pager      [phone]

## 2012-11-05 NOTE — Progress Notes (Signed)
Patient discharged to SNF/Rehab, alert and disoriented, discharge package given to Medical Transportation for delivery to SNF on arrival to unit, unable to complete MyChart r/t AMS, patient in stable condition at this time

## 2012-11-05 NOTE — Progress Notes (Signed)
CSW provided spouse with bed offers. He is agreeable to [facility]. Packet copied and placed in [place]. ptar called for transportation.  Dannilynn Gallina C. Argel Pablo MSW, LCSW [phone]

## 2012-11-06 ENCOUNTER — Encounter: Payer: Self-pay | Admitting: Internal Medicine

## 2012-11-06 ENCOUNTER — Non-Acute Institutional Stay (SKILLED_NURSING_FACILITY): Payer: Medicare Other | Admitting: Internal Medicine

## 2012-11-06 DIAGNOSIS — I10 Essential (primary) hypertension: Secondary | ICD-10-CM

## 2012-11-06 DIAGNOSIS — J189 Pneumonia, unspecified organism: Secondary | ICD-10-CM

## 2012-11-06 DIAGNOSIS — R531 Weakness: Secondary | ICD-10-CM

## 2012-11-06 DIAGNOSIS — R5383 Other fatigue: Secondary | ICD-10-CM

## 2012-11-06 DIAGNOSIS — J45901 Unspecified asthma with (acute) exacerbation: Secondary | ICD-10-CM

## 2012-11-06 LAB — CULTURE, BLOOD (ROUTINE X 2): Culture: NO GROWTH

## 2012-11-06 NOTE — Progress Notes (Signed)
Patient ID: [name], female   DOB: 1931-04-13, 79 y.o.   MRN: [id number]    PCP: Florentina Jenny, MD  Code Status: full code  No Known Allergies  Chief Complaint: new admit post hospitalization 10/31/12- 11/05/12  HPI:  79 y/o female patient was admitted to hospital on above date with dyspnea and found to be hypoxic.. cxr showed infiltration and she was admitted for acute respiratory failure due to HCAP. She was started on vancomycin, aztreonam and levofloxacin. Blood cultures were negative. She was switched to levofloxacin later and is to take it until 11/09/12. She was also treated for asthma exacerbation with iv solumedrol and nebulizer. Once medically improved, given her weakness she was sent to SNF for STR. Goal is for pt to return to ALF. Pt was seen in her room today with her sister present. She has dementia and is not a great historian. She denies any complaints. She appears tired and sleeping in between her meals. SLP team is with pt trying to assist her with her feed. Patient has started working with therapy team (PT/OT)  Review of Systems: Review of Systems  Constitutional: Negative for fever, chills and diaphoresis.  HENT: Negative for congestion.   Eyes: Negative for blurred vision.  Respiratory: Negative for cough and shortness of breath.   Cardiovascular: Negative for chest pain, palpitations and leg swelling.  Gastrointestinal: Negative for heartburn, nausea, vomiting and abdominal pain.  Genitourinary: Negative for dysuria.  Skin: Negative for rash.  Neurological: Positive for weakness. Negative for dizziness and headaches.  Psychiatric/Behavioral: Negative for depression.    Past Medical History  Diagnosis Date  . Alzheimer disease   . Dementia   . Hypertension   . Hypercholesteremia   . GERD (gastroesophageal reflux disease)   . Hyponatremia   . Seizures   . Asthma    History reviewed. No pertinent past surgical history. Social History:   reports that she  has never smoked. She has never used smokeless tobacco. She reports that she does not drink alcohol or use illicit drugs.  History reviewed. No pertinent family history.  Medications: Patient's Medications  New Prescriptions   No medications on file  Previous Medications   ALBUTEROL (PROVENTIL HFA;VENTOLIN HFA) 108 (90 BASE) MCG/ACT INHALER    Inhale 2 puffs into the lungs 2 (two) times daily.     ALBUTEROL (PROVENTIL HFA;VENTOLIN HFA) 108 (90 BASE) MCG/ACT INHALER    Inhale 2 puffs into the lungs every 4 (four) hours as needed for wheezing or shortness of breath.   AMLODIPINE (NORVASC) 5 MG TABLET    Take 1 tablet (5 mg total) by mouth daily.   ASPIRIN EC 81 MG TABLET    Take 81 mg by mouth every other day.   CHOLECALCIFEROL (VITAMIN D) 1000 UNITS TABLET    Take 1,000 Units by mouth daily.     CLONIDINE (CATAPRES) 0.1 MG TABLET    Take 1 tablet (0.1 mg total) by mouth 3 (three) times daily.   DIVALPROEX (DEPAKOTE SPRINKLE) 125 MG CAPSULE    Take 500 mg by mouth 2 (two) times daily. Sprinkles 4 capsules on food twice a day   DOCUSATE SODIUM (COLACE) 100 MG CAPSULE    Take 100 mg by mouth 2 (two) times daily.     DONEPEZIL (ARICEPT) 10 MG TABLET    Take 10 mg by mouth at bedtime.    FERROUS SULFATE 325 (65 FE) MG TABLET    Take 325 mg by mouth 2 (two) times daily.  FOLIC ACID (FOLVITE) 1 MG TABLET    Take 1 mg by mouth every other day.     GUAIFENESIN (ROBITUSSIN) 100 MG/5ML SOLN    Take 10 mLs by mouth every 4 (four) hours as needed (give every 4 hours while awake for 10 days as needed for cough).   LEVOFLOXACIN (LEVAQUIN) 750 MG TABLET    Take 1 tablet (750 mg total) by mouth every other day.   LORAZEPAM (ATIVAN) 0.5 MG TABLET    Take 0.25 mg by mouth 2 (two) times daily.   LOSARTAN (COZAAR) 100 MG TABLET    Take 100 mg by mouth daily.     METOPROLOL TARTRATE (LOPRESSOR) 50 MG TABLET    Take 1 tablet (50 mg total) by mouth 2 (two) times daily.   MONTELUKAST (SINGULAIR) 10 MG TABLET     Take 10 mg by mouth at bedtime.     MULTIPLE VITAMINS-CALCIUM (ONE-A-DAY WOMENS PO)    Take 1 tablet by mouth daily at 3 pm.    PANTOPRAZOLE (PROTONIX) 40 MG TABLET    Take 40 mg by mouth daily.     PREDNISONE (DELTASONE) 10 MG TABLET    Take 5 tabs on 3/28, 4 tabs 3/29, 3 tabs on 3/30, 2 tabs on 3/31, 1 tab on 4/1, then stop.   ROSUVASTATIN (CRESTOR) 10 MG TABLET    Take 10 mg by mouth daily.    Modified Medications   No medications on file  Discontinued Medications   No medications on file     Physical Exam:  Filed Vitals:   11/06/12 1018  BP: 109/65  Pulse: 69  Temp: 97.6 F (36.4 C)  Resp: 18  Height: 5' (1.524 m)  Weight: 146 lb (66.225 kg)  SpO2: 93%   Physical Exam  Constitutional:  Frail, elderly, pleasantly demented  HENT:  Head: Normocephalic and atraumatic.  Mouth/Throat: Oropharynx is clear and moist.  Eyes: Pupils are equal, round, and reactive to light.  Neck: Normal range of motion. Neck supple.  Cardiovascular: Normal rate and regular rhythm.   Pulmonary/Chest: Effort normal and breath sounds normal.  Abdominal: Soft. Bowel sounds are normal.  Musculoskeletal: Normal range of motion. She exhibits no edema.  Generalized weakness, has contracture in left hand  Neurological: She is alert.  Oriented only to person  Skin: Skin is warm and dry.  Psychiatric: She has a normal mood and affect.     Labs reviewed: Basic Metabolic Panel:  Recent Labs  40/98/11 0449 11/03/12 0520 11/04/12 0453  NA 144 139 137  K 3.6 3.7 4.0  CL 110 105 103  CO2 25 27 26   GLUCOSE 131* 123* 141*  BUN 33* 31* 38*  CREATININE 1.10 1.00 1.13*  CALCIUM 8.6 8.6 8.6   Liver Function Tests:  Recent Labs  10/31/12 1317  AST 18  ALT 9  ALKPHOS 49  BILITOT 0.3  PROT 6.7  ALBUMIN 2.9*    Recent Labs  10/31/12 1317  LIPASE 28   CBC:  Recent Labs  10/31/12 1317 11/01/12 0545 11/02/12 0449 11/03/12 0520  WBC 6.5 7.3 11.2* 11.2*  NEUTROABS 5.2  --   --   --    HGB 11.4* 10.1* 10.3* 10.8*  HCT 35.5* 29.6* 30.8* 32.0*  MCV 92.9 90.5 90.1 88.9  PLT 159 150 163 178   Cardiac Enzymes:  Recent Labs  10/31/12 1317  TROPONINI <0.30    Radiological Exams:  Dg Chest 1 View  10/31/2012 *RADIOLOGY REPORT* Clinical Data: Confusion, discomfort  CHEST - 1 VIEW Comparison: 05/12/2011 Findings: Cardiomediastinal silhouette is stable. No pulmonary edema. Hazy right basilar atelectasis or infiltrate. IMPRESSION: No pulmonary edema. Hazy right basilar atelectasis or infiltrate. Original Report Authenticated By: Natasha Mead, M.D.  Dg Chest Port 1 View  11/02/2012 *RADIOLOGY REPORT* Clinical Data: Cough and congestion PORTABLE CHEST - 1 VIEW Comparison: 10/31/2012 Findings: 0520 hours. The patient rotated slightly to the right. There is increased volume loss in the right base in the interval since the prior study. Associated right base collapse / consolidation noted. Left lung is clear. Degenerative changes noted in the right shoulder. Telemetry leads overlie the chest. IMPRESSION: Worsening collapse / consolidation with volume loss in the right lower chest. Original Report Authenticated By: Kennith Center, M.D.    Assessment/Plan  Generalized weakness- Will have her work with PT/OT. Fall precautions. Encourage po intake but with aspiration precautions. Complete antibiotic course  HCAP-  Continue levaquin until 11/09/12. With her being sleepy, will have SLP see pt and downgrade the consistency of her meal to appropriate dysphagia diet for now. Aspiration precautions and assistance with feed needed  Acute asthma exacerbation- Continue albuterol and singulair and to complete tapering course of prednsione on 11/10/12. Breathing is improved. No wheezing on exam.  CKD stage III Baseline creatinine 1.2-1.3, stable.   Dementia - Persists. continue Aricept and Depakote   Hypertension- Currently under control. Will continue amlodipine 5 mg daily, losartan 100 mg daily with  newly added clonidine 0.1 mg tid and metoprolol 50 mg bid. Continue ASA and statin  Hyperlipidemia  Continued statin  Anemia- Continue ferrous sulfate and folic acid supplement. Monitor cbc  Leukocytosis- Likely secondary to steroids and pneumonia. Will check her cbc a week after completion of her prednsione  Labs/tests ordered- cbc , cmp

## 2012-11-19 ENCOUNTER — Other Ambulatory Visit: Payer: Self-pay | Admitting: *Deleted

## 2012-11-19 MED ORDER — LORAZEPAM 0.5 MG PO TABS: ORAL_TABLET | ORAL | Status: DC

## 2012-12-07 ENCOUNTER — Encounter: Payer: Self-pay | Admitting: Gastroenterology

## 2013-05-05 ENCOUNTER — Encounter: Payer: Self-pay | Admitting: Cardiovascular Disease

## 2013-08-03 ENCOUNTER — Encounter (HOSPITAL_COMMUNITY): Payer: Self-pay | Admitting: Emergency Medicine

## 2013-08-03 ENCOUNTER — Observation Stay (HOSPITAL_COMMUNITY)
Admission: EM | Admit: 2013-08-03 | Discharge: 2013-08-06 | Disposition: A | Discharge status: Skilled Nursing Facility | Payer: Medicare Other | Attending: Internal Medicine | Admitting: Internal Medicine

## 2013-08-03 ENCOUNTER — Emergency Department (HOSPITAL_COMMUNITY): Payer: Medicare Other

## 2013-08-03 DIAGNOSIS — F0390 Unspecified dementia without behavioral disturbance: Secondary | ICD-10-CM | POA: Diagnosis present

## 2013-08-03 DIAGNOSIS — I10 Essential (primary) hypertension: Secondary | ICD-10-CM | POA: Diagnosis present

## 2013-08-03 DIAGNOSIS — K219 Gastro-esophageal reflux disease without esophagitis: Secondary | ICD-10-CM | POA: Insufficient documentation

## 2013-08-03 DIAGNOSIS — R062 Wheezing: Secondary | ICD-10-CM | POA: Insufficient documentation

## 2013-08-03 DIAGNOSIS — F0280 Dementia in other diseases classified elsewhere without behavioral disturbance: Secondary | ICD-10-CM | POA: Insufficient documentation

## 2013-08-03 DIAGNOSIS — Z7982 Long term (current) use of aspirin: Secondary | ICD-10-CM | POA: Insufficient documentation

## 2013-08-03 DIAGNOSIS — E7800 Pure hypercholesterolemia, unspecified: Secondary | ICD-10-CM | POA: Insufficient documentation

## 2013-08-03 DIAGNOSIS — E871 Hypo-osmolality and hyponatremia: Secondary | ICD-10-CM | POA: Insufficient documentation

## 2013-08-03 DIAGNOSIS — G319 Degenerative disease of nervous system, unspecified: Secondary | ICD-10-CM | POA: Insufficient documentation

## 2013-08-03 DIAGNOSIS — R55 Syncope and collapse: Principal | ICD-10-CM | POA: Diagnosis present

## 2013-08-03 DIAGNOSIS — J45909 Unspecified asthma, uncomplicated: Secondary | ICD-10-CM | POA: Insufficient documentation

## 2013-08-03 DIAGNOSIS — G309 Alzheimer's disease, unspecified: Secondary | ICD-10-CM | POA: Insufficient documentation

## 2013-08-03 DIAGNOSIS — R569 Unspecified convulsions: Secondary | ICD-10-CM | POA: Diagnosis present

## 2013-08-03 DIAGNOSIS — I498 Other specified cardiac arrhythmias: Secondary | ICD-10-CM | POA: Insufficient documentation

## 2013-08-03 DIAGNOSIS — N189 Chronic kidney disease, unspecified: Secondary | ICD-10-CM

## 2013-08-03 DIAGNOSIS — R531 Weakness: Secondary | ICD-10-CM

## 2013-08-03 HISTORY — DX: Prediabetes: R73.03

## 2013-08-03 HISTORY — DX: Cerebral infarction, unspecified: I63.9

## 2013-08-03 HISTORY — DX: Pneumonia, unspecified organism: J18.9

## 2013-08-03 LAB — CREATININE, SERUM
Creatinine, Ser: 1.05 mg/dL (ref 0.50–1.10)
GFR calc Af Amer: 57 mL/min — ABNORMAL LOW (ref >90)
GFR calc non Af Amer: 49 mL/min — ABNORMAL LOW (ref >90)

## 2013-08-03 LAB — BASIC METABOLIC PANEL
BUN: 31 mg/dL — ABNORMAL HIGH (ref 6–23)
GFR calc Af Amer: 55 mL/min — ABNORMAL LOW (ref >90)
GFR calc non Af Amer: 48 mL/min — ABNORMAL LOW (ref >90)
Potassium: 4.8 mEq/L (ref 3.5–5.1)
Sodium: 133 mEq/L — ABNORMAL LOW (ref 135–145)

## 2013-08-03 LAB — CBC WITH DIFFERENTIAL/PLATELET
Basophils Relative: 1 % (ref 0–1)
Eosinophils Absolute: 0.1 10*3/uL (ref 0.0–0.7)
MCH: 30.1 pg (ref 26.0–34.0)
MCHC: 33.9 g/dL (ref 30.0–36.0)
Monocytes Relative: 8 % (ref 3–12)
Neutrophils Relative %: 61 % (ref 43–77)
Platelets: 214 10*3/uL (ref 150–400)

## 2013-08-03 LAB — VALPROIC ACID LEVEL: Valproic Acid Lvl: 24.1 ug/mL — ABNORMAL LOW (ref 50.0–100.0)

## 2013-08-03 LAB — POCT I-STAT TROPONIN I: Troponin i, poc: 0.01 ng/mL (ref 0.00–0.08)

## 2013-08-03 MED ORDER — VITAMIN D3 25 MCG (1000 UNIT) PO TABS
1000.0000 [IU] | ORAL_TABLET | Freq: Every day | ORAL | Status: DC
  Administered 2013-08-04 – 2013-08-06 (×3): 1000 [IU] via ORAL
  Filled 2013-08-03 (×3): qty 1

## 2013-08-03 MED ORDER — AMLODIPINE BESYLATE 5 MG PO TABS
5.0000 mg | ORAL_TABLET | Freq: Every day | ORAL | Status: DC
Start: 2013-08-04 — End: 2013-08-06
  Administered 2013-08-04 – 2013-08-06 (×3): 5 mg via ORAL
  Filled 2013-08-03 (×3): qty 1

## 2013-08-03 MED ORDER — ATORVASTATIN CALCIUM 10 MG PO TABS
10.0000 mg | ORAL_TABLET | Freq: Every day | ORAL | Status: DC
  Administered 2013-08-03 – 2013-08-05 (×3): 10 mg via ORAL
  Filled 2013-08-03 (×4): qty 1

## 2013-08-03 MED ORDER — DOCUSATE SODIUM 100 MG PO CAPS
100.0000 mg | ORAL_CAPSULE | Freq: Two times a day (BID) | ORAL | Status: DC
  Administered 2013-08-04 – 2013-08-06 (×4): 100 mg via ORAL
  Filled 2013-08-03 (×6): qty 1

## 2013-08-03 MED ORDER — DIVALPROEX SODIUM 125 MG PO CPSP
250.0000 mg | ORAL_CAPSULE | Freq: Two times a day (BID) | ORAL | Status: DC
  Administered 2013-08-04: 250 mg via ORAL
  Filled 2013-08-03 (×4): qty 2

## 2013-08-03 MED ORDER — MONTELUKAST SODIUM 10 MG PO TABS
10.0000 mg | ORAL_TABLET | Freq: Every day | ORAL | Status: DC
  Administered 2013-08-05: 10 mg via ORAL
  Filled 2013-08-03 (×4): qty 1

## 2013-08-03 MED ORDER — DONEPEZIL HCL 5 MG PO TABS
5.0000 mg | ORAL_TABLET | Freq: Every day | ORAL | Status: DC
  Administered 2013-08-05: 5 mg via ORAL
  Filled 2013-08-03 (×4): qty 1

## 2013-08-03 MED ORDER — ENOXAPARIN SODIUM 40 MG/0.4ML ~~LOC~~ SOLN
40.0000 mg | SUBCUTANEOUS | Status: DC
  Administered 2013-08-04 – 2013-08-05 (×2): 40 mg via SUBCUTANEOUS
  Filled 2013-08-03 (×4): qty 0.4

## 2013-08-03 MED ORDER — SODIUM CHLORIDE 0.9 % IJ SOLN
3.0000 mL | Freq: Two times a day (BID) | INTRAMUSCULAR | Status: DC
  Administered 2013-08-05: 3 mL via INTRAVENOUS

## 2013-08-03 MED ORDER — SODIUM CHLORIDE 0.9 % IV SOLN
INTRAVENOUS | Status: DC
  Administered 2013-08-03: 16:00:00 1000 mL via INTRAVENOUS

## 2013-08-03 MED ORDER — ASPIRIN EC 81 MG PO TBEC
81.0000 mg | DELAYED_RELEASE_TABLET | ORAL | Status: DC
  Administered 2013-08-05: 81 mg via ORAL
  Filled 2013-08-03 (×2): qty 1

## 2013-08-03 MED ORDER — PANTOPRAZOLE SODIUM 40 MG PO TBEC
40.0000 mg | DELAYED_RELEASE_TABLET | Freq: Every day | ORAL | Status: DC
  Administered 2013-08-04 – 2013-08-06 (×3): 40 mg via ORAL
  Filled 2013-08-03 (×3): qty 1

## 2013-08-03 MED ORDER — FOLIC ACID 1 MG PO TABS
1.0000 mg | ORAL_TABLET | ORAL | Status: DC
  Administered 2013-08-03 – 2013-08-05 (×2): 1 mg via ORAL
  Filled 2013-08-03 (×2): qty 1

## 2013-08-03 MED ORDER — ALBUTEROL SULFATE HFA 108 (90 BASE) MCG/ACT IN AERS: 2.0000 | INHALATION_SPRAY | RESPIRATORY_TRACT | Status: DC | PRN

## 2013-08-03 MED ORDER — METOPROLOL TARTRATE 25 MG PO TABS
25.0000 mg | ORAL_TABLET | Freq: Two times a day (BID) | ORAL | Status: DC
  Administered 2013-08-04 – 2013-08-06 (×4): 25 mg via ORAL
  Filled 2013-08-03 (×7): qty 1

## 2013-08-03 MED ORDER — ALBUTEROL SULFATE (5 MG/ML) 0.5% IN NEBU
5.0000 mg | INHALATION_SOLUTION | Freq: Once | RESPIRATORY_TRACT | Status: AC
  Administered 2013-08-03: 5 mg via RESPIRATORY_TRACT
  Filled 2013-08-03: qty 1

## 2013-08-03 MED ORDER — FERROUS SULFATE 325 (65 FE) MG PO TABS
325.0000 mg | ORAL_TABLET | Freq: Every day | ORAL | Status: DC
  Administered 2013-08-05: 22:00:00 325 mg via ORAL
  Filled 2013-08-03 (×4): qty 1

## 2013-08-03 NOTE — Progress Notes (Signed)
Attempt to call ED for report transfer x 3, on hold.

## 2013-08-03 NOTE — H&P (Addendum)
[facility] History and Physical  [name] ZOX:[id number] DOB: February 10, 1931 DOA: 08/03/2013  Referring physician:  PCP: Florentina Jenny, MD  Specialists:   Chief Complaint: syncope   HPI: [name] is a 79 y.o. female with PMH of advanced dementia, HTN, seizure, presented with syncope; Per the nursing home the patient was found sitting at the table with her head on the table completely unconscious. The nursing home states this was lasting for approximately 10 minutes. When she started waking up her tone start decreasing a little and after another 10 minutes she started to come back to her normal self; no h/o fever, no chest pain, no SOB, no nausea vomiting or diarrhea;   Review of Systems: The patient denies anorexia, fever, weight loss,, vision loss, decreased hearing, hoarseness, chest pain, dyspnea on exertion, peripheral edema, balance deficits, hemoptysis, abdominal pain, melena, hematochezia, severe indigestion/heartburn, hematuria, incontinence, genital sores, muscle weakness, suspicious skin lesions, transient blindness, difficulty walking, depression, unusual weight change, abnormal bleeding, enlarged lymph nodes, angioedema, and breast masses.    Past Medical History  Diagnosis Date  . Alzheimer disease   . Dementia   . Hypertension   . Hypercholesteremia   . GERD (gastroesophageal reflux disease)   . Hyponatremia   . Seizures   . Asthma    Past Surgical History  Procedure Laterality Date  . Cataract extraction     Social History:  reports that she has never smoked. She has never used smokeless tobacco. She reports that she does not drink alcohol or use illicit drugs. Dementia unit;  where does patient live--home, ALF, SNF? and with whom if at home? No;  Can patient participate in ADLs?  No Known Allergies  No family history on file. no h/o CAD (be sure to complete)  Prior to Admission medications   Medication Sig Start Date End Date Taking? Authorizing  Provider  albuterol (PROVENTIL HFA;VENTOLIN HFA) 108 (90 BASE) MCG/ACT inhaler Inhale 2 puffs into the lungs 2 (two) times daily.     Yes Historical Provider, MD  albuterol (PROVENTIL HFA;VENTOLIN HFA) 108 (90 BASE) MCG/ACT inhaler Inhale 2 puffs into the lungs every 4 (four) hours as needed for wheezing or shortness of breath.   Yes Historical Provider, MD  amLODipine (NORVASC) 5 MG tablet Take 1 tablet (5 mg total) by mouth daily. 11/05/12  Yes Renae Fickle, MD  aspirin EC 81 MG tablet Take 81 mg by mouth every other day.   Yes Historical Provider, MD  cholecalciferol (VITAMIN D) 1000 UNITS tablet Take 1,000 Units by mouth daily.     Yes Historical Provider, MD  divalproex (DEPAKOTE SPRINKLE) 125 MG capsule Take 250 mg by mouth 2 (two) times daily. Sprinkle on food   Yes Historical Provider, MD  docusate sodium (COLACE) 100 MG capsule Take 100 mg by mouth 2 (two) times daily.     Yes Historical Provider, MD  donepezil (ARICEPT) 5 MG tablet Take 5 mg by mouth at bedtime.   Yes Historical Provider, MD  ferrous sulfate 325 (65 FE) MG tablet Take 325 mg by mouth 2 (two) times daily.    Yes Historical Provider, MD  folic acid (FOLVITE) 1 MG tablet Take 1 mg by mouth every other day.     Yes Historical Provider, MD  losartan (COZAAR) 100 MG tablet Take 100 mg by mouth daily.     Yes Historical Provider, MD  metoprolol tartrate (LOPRESSOR) 50 MG tablet Take 1 tablet (50 mg total) by mouth 2 (two) times daily. 11/05/12  Yes Renae Fickle, MD  montelukast (SINGULAIR) 10 MG tablet Take 10 mg by mouth at bedtime.     Yes Historical Provider, MD  Multiple Vitamins-Calcium (ONE-A-DAY WOMENS PO) Take 1 tablet by mouth daily at 3 pm.    Yes Historical Provider, MD  pantoprazole (PROTONIX) 40 MG tablet Take 40 mg by mouth daily.     Yes Historical Provider, MD  rosuvastatin (CRESTOR) 10 MG tablet Take 10 mg by mouth daily.     Yes Historical Provider, MD   Physical Exam: Filed Vitals:   08/03/13 1400  BP:  111/91  Pulse:   Temp:   Resp: 22     General:  Alert, but confused, does not follow commands   Eyes: eom-i  ENT: no oral ulcers   Neck: supple   Cardiovascular: s1,s2 rrr  Respiratory: CTA BL  Abdomen: soft, nt, nd   Skin: no rash   Musculoskeletal: no LE edema  Psychiatric: confused  Neurologic: CN 2-12 seem to be intact; patient does not follow commands, motor able to move all extremities   Labs on Admission:  Basic Metabolic Panel:  Recent Labs Lab 08/03/13 1104  NA 133*  K 4.8  CL 95*  CO2 29  GLUCOSE 93  BUN 31*  CREATININE 1.07  CALCIUM 9.3   Liver Function Tests: No results found for this basename: AST, ALT, ALKPHOS, BILITOT, PROT, ALBUMIN,  in the last 168 hours No results found for this basename: LIPASE, AMYLASE,  in the last 168 hours No results found for this basename: AMMONIA,  in the last 168 hours CBC:  Recent Labs Lab 08/03/13 1104  WBC 6.7  NEUTROABS 4.0  HGB 12.1  HCT 35.7*  MCV 88.8  PLT 214   Cardiac Enzymes: No results found for this basename: CKTOTAL, CKMB, CKMBINDEX, TROPONINI,  in the last 168 hours  BNP (last 3 results)  Recent Labs  10/31/12 1317 08/03/13 1104  PROBNP 736.1* 539.8*   CBG: No results found for this basename: GLUCAP,  in the last 168 hours  Radiological Exams on Admission: Dg Chest 1 View  08/03/2013   CLINICAL DATA:  Altered mental status.  EXAM: CHEST - 1 VIEW  COMPARISON:  Chest x-ray of November 02, 2012.  FINDINGS: The lungs are well-expanded and clear. There is no pneumothorax. There is no pleural effusion. The cardiopericardial silhouette is normal in size. The mediastinum is normal in width. The observed portions of the bony thorax appear normal.  IMPRESSION: There is no evidence of pneumonia nor CHF nor other acute cardiopulmonary abnormality.   Electronically Signed   By: David  [place]   On: 08/03/2013 11:50   Ct Head Wo Contrast  08/03/2013   CLINICAL DATA:  Loss of consciousness, seizure   EXAM: CT HEAD WITHOUT CONTRAST  TECHNIQUE: Contiguous axial images were obtained from the base of the skull through the vertex without intravenous contrast.  COMPARISON:  06/11/2011  FINDINGS: No skull fracture is noted. No intracranial hemorrhage, mass effect or midline shift. Paranasal sinuses and mastoid air cells are unremarkable. Stable cerebral atrophy and chronic white matter disease. No acute cortical infarction. Motion artifact are noted. No mass lesion is noted on this unenhanced scan.  IMPRESSION: No acute intracranial abnormality. Stable atrophy and chronic white matter disease.   Electronically Signed   By: Natasha Mead M.D.   On: 08/03/2013 12:09    EKG: Independently reviewed. Nsr, bradycardia;   Assessment/Plan Principal Problem:   Syncope Active Problems:   Dementia  HTN (hypertension)   Seizure  79 y.o. female with PMH of advanced dementia, HTN, seizure, presented with syncope; Per the nursing home the patient was found sitting at the table with her head on the table completely unconscious.  1. Syncope vs Probable seizure; CT: No acute intracranial abnormality. brain atrophy; per family last seizure was five years ago;  -patient is back to baseline; cont PO antiepileptics (if more new seizures may need to change to keppra); EEG; monitor on tele r/o arrythmia; obtain UA r/o UTI   2. HTN' cont home regimen   3. Advanced dementia; cont home regimen   4. Bradycardia likely BB related; decrease the dose to 25 mg; monitor on tele  -clinically patient ishypovolemic despite mild elevated bnp; gentle IVF for 12 hours   If stable possible d/c to dementia unit in AM  None;  if consultant consulted, please document name and whether formally or informally consulted  Code Status: DNR (must indicate code status--if unknown or must be presumed, indicate so) Family Communication: daughter at the bedside (indicate person spoken with, if applicable, with phone number if by  telephone) Disposition Plan: SNF/dementia unit  (indicate anticipated LOS)  Time spent: >35 minutes   Esperanza Sheets [facility] Pager [phone]  If 7PM-7AM, please contact night-coverage [email or web address] Password [email or web address] 08/03/2013, 2:12 PM

## 2013-08-03 NOTE — ED Notes (Signed)
Patient transported to X-ray 

## 2013-08-03 NOTE — ED Notes (Signed)
Pt is alert to place and self which is her norm

## 2013-08-03 NOTE — ED Notes (Signed)
Report given to Misty Stanley, RN unit 5W.

## 2013-08-03 NOTE — Progress Notes (Addendum)
Pt is refusing to take her po med. Page MD on call. Awaiting further orders. Per MD I. Magick-Myers it's ok for pt to miss dose of med & just document that pt refused.

## 2013-08-03 NOTE — ED Notes (Signed)
Patient placed on bedpan. Family at bedside with call light in reach.

## 2013-08-03 NOTE — Progress Notes (Signed)
NURSING PROGRESS NOTE  [name] [id number] Admission Data: 08/03/2013 3:13 PM Attending Provider: Esperanza Sheets, MD [name]:[id number], Sherilyn Cooter, MD Code Status: DNR  [name] is a 79 y.o. female patient admitted from ED:  -No acute distress noted.  -No complaints of shortness of breath.  -No complaints of chest pain.   Cardiac Monitoring: Box # 03 in place. Cardiac monitor yields:normal sinus rhythm.  Blood pressure 129/58, pulse 61, temperature 98.2 F (36.8 C), temperature source Oral, resp. rate 13, SpO2 98.00%.   IV Fluids:  IV in place, occlusive dsg intact without redness, IV cath upper arm left, condition patent and no redness none.   Allergies:  Review of patient's allergies indicates no known allergies.  Past Medical History:   has a past medical history of Alzheimer disease; Dementia; Hypertension; Hypercholesteremia; GERD (gastroesophageal reflux disease); Hyponatremia; Seizures; and Asthma.  Past Surgical History:   has past surgical history that includes Cataract extraction.  Social History:   reports that she has never smoked. She has never used smokeless tobacco. She reports that she does not drink alcohol or use illicit drugs.  Skin: warm dry intact   Patient/Family orientated to room. Information packet given to patient/family. Admission inpatient armband information verified with patient/family to include name and date of birth and placed on patient arm. Side rails up x 2, fall assessment and education completed with patient/family. Patient/family able to verbalize understanding of risk associated with falls and verbalized understanding to call for assistance before getting out of bed. Call light within reach. Patient/family able to voice and demonstrate understanding of unit orientation instructions.    Will continue to evaluate and treat per MD orders.

## 2013-08-03 NOTE — ED Provider Notes (Signed)
CSN: [id number]     Arrival date & time 08/03/13  1006 History   First MD Initiated Contact with Patient 08/03/13 1011     Chief Complaint  Patient presents with  . Loss of Consciousness   (Consider location/radiation/quality/duration/timing/severity/associated sxs/prior Treatment) HPI Comments: 79 year old female presents after a syncopal episode at home. The history is taken from the nursing home and the daughter as the patient is quite demented. His dementia is not new. Per the nursing home the patient was found sitting at the table with her head on the table completely unconscious. The nursing home states this was lasting for approximately 10 minutes. When she started waking up her tone start decreasing a little and after another 10 minutes she started to come back to her normal self. Per the nursing home and the patient she does not seem to have any chest pain or shortness of breath at this time. No new complaints.   Past Medical History  Diagnosis Date  . Alzheimer disease   . Dementia   . Hypertension   . Hypercholesteremia   . GERD (gastroesophageal reflux disease)   . Hyponatremia   . Seizures   . Asthma    History reviewed. No pertinent past surgical history. No family history on file. History  Substance Use Topics  . Smoking status: Never Smoker   . Smokeless tobacco: Never Used  . Alcohol Use: No   OB History   Grav Para Term Preterm Abortions TAB SAB Ect Mult Living                 Review of Systems  Unable to perform ROS: Dementia    Allergies  Review of patient's allergies indicates no known allergies.  Home Medications   Current Outpatient Rx  Name  Route  Sig  Dispense  Refill  . albuterol (PROVENTIL HFA;VENTOLIN HFA) 108 (90 BASE) MCG/ACT inhaler   Inhalation   Inhale 2 puffs into the lungs 2 (two) times daily.           Marland Kitchen albuterol (PROVENTIL HFA;VENTOLIN HFA) 108 (90 BASE) MCG/ACT inhaler   Inhalation   Inhale 2 puffs into the lungs every 4  (four) hours as needed for wheezing or shortness of breath.         Marland Kitchen amLODipine (NORVASC) 5 MG tablet   Oral   Take 1 tablet (5 mg total) by mouth daily.   30 tablet   0   . aspirin EC 81 MG tablet   Oral   Take 81 mg by mouth every other day.         . cholecalciferol (VITAMIN D) 1000 UNITS tablet   Oral   Take 1,000 Units by mouth daily.           . divalproex (DEPAKOTE SPRINKLE) 125 MG capsule   Oral   Take 250 mg by mouth 2 (two) times daily. Sprinkle on food         . docusate sodium (COLACE) 100 MG capsule   Oral   Take 100 mg by mouth 2 (two) times daily.           Marland Kitchen donepezil (ARICEPT) 5 MG tablet   Oral   Take 5 mg by mouth at bedtime.         . ferrous sulfate 325 (65 FE) MG tablet   Oral   Take 325 mg by mouth 2 (two) times daily.          . folic acid (FOLVITE) 1  MG tablet   Oral   Take 1 mg by mouth every other day.           . losartan (COZAAR) 100 MG tablet   Oral   Take 100 mg by mouth daily.           . metoprolol tartrate (LOPRESSOR) 50 MG tablet   Oral   Take 1 tablet (50 mg total) by mouth 2 (two) times daily.   60 tablet   0   . montelukast (SINGULAIR) 10 MG tablet   Oral   Take 10 mg by mouth at bedtime.           . Multiple Vitamins-Calcium (ONE-A-DAY WOMENS PO)   Oral   Take 1 tablet by mouth daily at 3 pm.          . pantoprazole (PROTONIX) 40 MG tablet   Oral   Take 40 mg by mouth daily.           . rosuvastatin (CRESTOR) 10 MG tablet   Oral   Take 10 mg by mouth daily.            BP 123/56  Pulse 51  Temp(Src) 98.2 F (36.8 C) (Oral)  Resp 18  SpO2 100% Physical Exam  Vitals reviewed. Constitutional: She appears well-developed and well-nourished.  HENT:  Head: Normocephalic and atraumatic.  Right Ear: External ear normal.  Left Ear: External ear normal.  Nose: Nose normal.  Eyes: Right eye exhibits no discharge. Left eye exhibits no discharge.  Cardiovascular: Normal rate, regular  rhythm and normal heart sounds.   Pulmonary/Chest: Effort normal and breath sounds normal.  Abdominal: Soft. There is no tenderness.  Neurological: She is alert. She has normal strength. She is disoriented. No cranial nerve deficit or sensory deficit.  Grossly normal strength in all 4 extremities. Does not follow commands well.  Skin: Skin is warm and dry.    ED Course  Procedures (including critical care time) Labs Review Labs Reviewed  CBC WITH DIFFERENTIAL - Abnormal; Notable for the following:    HCT 35.7 (*)    All other components within normal limits  PRO B NATRIURETIC PEPTIDE - Abnormal; Notable for the following:    Pro B Natriuretic peptide (BNP) 539.8 (*)    All other components within normal limits  BASIC METABOLIC PANEL  URINALYSIS, ROUTINE W REFLEX MICROSCOPIC  POCT I-STAT TROPONIN I   Imaging Review Dg Chest 1 View  08/03/2013   CLINICAL DATA:  Altered mental status.  EXAM: CHEST - 1 VIEW  COMPARISON:  Chest x-ray of November 02, 2012.  FINDINGS: The lungs are well-expanded and clear. There is no pneumothorax. There is no pleural effusion. The cardiopericardial silhouette is normal in size. The mediastinum is normal in width. The observed portions of the bony thorax appear normal.  IMPRESSION: There is no evidence of pneumonia nor CHF nor other acute cardiopulmonary abnormality.   Electronically Signed   By: David  [place]   On: 08/03/2013 11:50    EKG Interpretation    Date/Time:  Tuesday August 03 2013 10:29:11 EST Ventricular Rate:  56 PR Interval:  192 QRS Duration: 87 QT Interval:  479 QTC Calculation: 462 R Axis:   64 Text Interpretation:  Sinus rhythm Abnrm T, consider ischemia, anterolateral lds Artifact in lead(s) I II III aVR aVL aVF V1 V2 V3 V4 V5 V6 Hyperacute T waves No signs of STEMI Changes noted Confirmed by Brenisha Tsui  MD, Barbarita Hutmacher ([id number]) on 08/03/2013 11:04:12 AM  MDM   1. Syncope    Patient alert and at her mental baseline per  daughter at bedside. Otherwise grossly intact. Story concerning for seizure vs syncope. Given abnormal EKG and unable to get history from patient, will place in observation with hospitalist for tele and seizure w/u. Discussed EKG with Dr. Herbie Baltimore, who agrees there seems to be some ST elevation but no reciprocal changes and no evidence of troponin leak or chest pain. Will cycle enzymes while with hospitalist.    Audree Camel, MD 08/03/13 1924

## 2013-08-03 NOTE — ED Notes (Signed)
Pt is here from an assisted living this morning , pt had a syncopal episode at breakfast this am . When EMS arrived pt was alert , Cgb 102 , pt has some exp wheezing but refused breathing treatment from ems

## 2013-08-03 NOTE — Consult Note (Addendum)
Reason for Consult:Syncope Referring Physician: York Spaniel  CC: Syncope  HPI: [name] is an 79 y.o. female who is at a SNF.  Per the nursing home the patient was found sitting at the table with her head on the table completely unconscious. The nursing home states this was lasting for approximately 10 minutes. When she started waking up her tone start decreasing a little and after another 10 minutes she started to come back to her normal self; no h/o fever, no chest pain, no SOB, no nausea vomiting or diarrhea.  Daughter reports that this is similar to what happened about 5 years ago when she was diagnosed with seizures.     Past Medical History  Diagnosis Date  . Alzheimer disease   . Dementia   . Hypertension   . Hypercholesteremia   . GERD (gastroesophageal reflux disease)   . Hyponatremia   . Asthma   . Borderline diabetes   . Seizures     "one 5 years ago; none since as far as I know" (08/03/2013)  . Stroke     "said she had a mini stroke" (08/03/2013)  . Pneumonia 10/2012    Past Surgical History  Procedure Laterality Date  . Cataract extraction    . Total knee arthroplasty Bilateral   . Cataract extraction w/ intraocular lens  implant, bilateral Bilateral     Family history: No family history of seizures.  Daughter alive and well.    Social History:  reports that she has never smoked. She has never used smokeless tobacco. She reports that she does not drink alcohol or use illicit drugs.  No Known Allergies  Medications:  I have reviewed the patient's current medications. Prior to Admission:  Prescriptions prior to admission  Medication Sig Dispense Refill  . albuterol (PROVENTIL HFA;VENTOLIN HFA) 108 (90 BASE) MCG/ACT inhaler Inhale 2 puffs into the lungs 2 (two) times daily.        Marland Kitchen albuterol (PROVENTIL HFA;VENTOLIN HFA) 108 (90 BASE) MCG/ACT inhaler Inhale 2 puffs into the lungs every 4 (four) hours as needed for wheezing or shortness of breath.      Marland Kitchen  amLODipine (NORVASC) 5 MG tablet Take 1 tablet (5 mg total) by mouth daily.  30 tablet  0  . aspirin EC 81 MG tablet Take 81 mg by mouth every other day.      . cholecalciferol (VITAMIN D) 1000 UNITS tablet Take 1,000 Units by mouth daily.        . divalproex (DEPAKOTE SPRINKLE) 125 MG capsule Take 250 mg by mouth 2 (two) times daily. Sprinkle on food      . docusate sodium (COLACE) 100 MG capsule Take 100 mg by mouth 2 (two) times daily.        Marland Kitchen donepezil (ARICEPT) 5 MG tablet Take 5 mg by mouth at bedtime.      . ferrous sulfate 325 (65 FE) MG tablet Take 325 mg by mouth 2 (two) times daily.       . folic acid (FOLVITE) 1 MG tablet Take 1 mg by mouth every other day.        . losartan (COZAAR) 100 MG tablet Take 100 mg by mouth daily.        . metoprolol tartrate (LOPRESSOR) 50 MG tablet Take 1 tablet (50 mg total) by mouth 2 (two) times daily.  60 tablet  0  . montelukast (SINGULAIR) 10 MG tablet Take 10 mg by mouth at bedtime.        Marland Kitchen  Multiple Vitamins-Calcium (ONE-A-DAY WOMENS PO) Take 1 tablet by mouth daily at 3 pm.       . pantoprazole (PROTONIX) 40 MG tablet Take 40 mg by mouth daily.        . rosuvastatin (CRESTOR) 10 MG tablet Take 10 mg by mouth daily.         Scheduled: . [START ON 08/04/2013] amLODipine  5 mg Oral Daily  . aspirin EC  81 mg Oral QODAY  . atorvastatin  10 mg Oral q1800  . [START ON 08/04/2013] cholecalciferol  1,000 Units Oral Daily  . divalproex  250 mg Oral BID  . docusate sodium  100 mg Oral BID  . donepezil  5 mg Oral QHS  . enoxaparin (LOVENOX) injection  40 mg Subcutaneous Q24H  . ferrous sulfate  325 mg Oral QHS  . folic acid  1 mg Oral QODAY  . metoprolol tartrate  25 mg Oral BID  . montelukast  10 mg Oral QHS  . pantoprazole  40 mg Oral Daily  . sodium chloride  3 mL Intravenous Q12H    ROS: History obtained from the patient  General ROS: negative for - chills, fatigue, fever, night sweats, weight gain or weight loss Psychological ROS:  negative for - behavioral disorder, hallucinations, memory difficulties, mood swings or suicidal ideation Ophthalmic ROS: negative for - blurry vision, double vision, eye pain or loss of vision ENT ROS: negative for - epistaxis, nasal discharge, oral lesions, sore throat, tinnitus or vertigo Allergy and Immunology ROS: negative for - hives or itchy/watery eyes Hematological and Lymphatic ROS: negative for - bleeding problems, bruising or swollen lymph nodes Endocrine ROS: negative for - galactorrhea, hair pattern changes, polydipsia/polyuria or temperature intolerance Respiratory ROS: negative for - cough, hemoptysis, shortness of breath or wheezing Cardiovascular ROS: negative for - chest pain, dyspnea on exertion, edema or irregular heartbeat Gastrointestinal ROS: negative for - abdominal pain, diarrhea, hematemesis, nausea/vomiting or stool incontinence Genito-Urinary ROS: negative for - dysuria, hematuria, incontinence or urinary frequency/urgency Musculoskeletal ROS: negative for - joint swelling or muscular weakness Neurological ROS: as noted in HPI Dermatological ROS: negative for rash and skin lesion changes  Physical Examination: Blood pressure 166/83, pulse 65, temperature 98.3 F (36.8 C), temperature source Oral, resp. rate 18, SpO2 98.00%.  Neurologic Examination Mental Status: Alert.  Not oriented.  Does not always respond appropriately to questions asked.  Speech fluent but dysarthric.  Able to follow some simple commands.  Unable to follow 3 step commands. Cranial Nerves: II: Discs flat bilaterally; Blinks to bilateral confrontation.  Pupils equal, round, reactive to light and accommodation III,IV, VI: ptosis not present, extra-ocular motions intact bilaterally V,VII: smile symmetric, facial light touch sensation normal bilaterally VIII: hearing normal bilaterally IX,X: gag reflex present XI: bilateral shoulder shrug XII: midline tongue extension Motor: Moves all  extremities against gravity but does not follow commands for formal testing Sensory: Pinprick and light touch intact throughout, bilaterally Deep Tendon Reflexes: 2+ in the upper extremities and absent in the lower extremities Plantars: Unable to perform secondary to withdrawal Cerebellar: Unable to perform Gait: Unable to test CV: pulses palpable throughout     Laboratory Studies:   Basic Metabolic Panel:  Recent Labs Lab 08/03/13 1104 08/03/13 1635  NA 133*  --   K 4.8  --   CL 95*  --   CO2 29  --   GLUCOSE 93  --   BUN 31*  --   CREATININE 1.07 1.05  CALCIUM 9.3  --  Liver Function Tests: No results found for this basename: AST, ALT, ALKPHOS, BILITOT, PROT, ALBUMIN,  in the last 168 hours No results found for this basename: LIPASE, AMYLASE,  in the last 168 hours No results found for this basename: AMMONIA,  in the last 168 hours  CBC:  Recent Labs Lab 08/03/13 1104  WBC 6.7  NEUTROABS 4.0  HGB 12.1  HCT 35.7*  MCV 88.8  PLT 214    Cardiac Enzymes: No results found for this basename: CKTOTAL, CKMB, CKMBINDEX, TROPONINI,  in the last 168 hours  BNP: No components found with this basename: POCBNP,   CBG: No results found for this basename: GLUCAP,  in the last 168 hours  Microbiology: Results for orders placed during the hospital encounter of 10/31/12  CULTURE, BLOOD (ROUTINE X 2)     Status: None   Collection Time    10/31/12  4:10 PM      Result Value Range Status   Specimen Description BLOOD LEFT ARM  2 ML IN [facility] BOTTLE   Final   Special Requests NONE   Final   Culture  Setup Time 10/31/2012 21:48   Final   Culture NO GROWTH 5 DAYS   Final   Report Status 11/06/2012 FINAL   Final  CULTURE, BLOOD (ROUTINE X 2)     Status: None   Collection Time    10/31/12  4:23 PM      Result Value Range Status   Specimen Description BLOOD LEFT HAND  2 ML IN [facility] BOTTLE   Final   Special Requests NONE   Final   Culture  Setup Time 10/31/2012 21:48    Final   Culture NO GROWTH 5 DAYS   Final   Report Status 11/06/2012 FINAL   Final    Coagulation Studies: No results found for this basename: LABPROT, INR,  in the last 72 hours  Urinalysis: No results found for this basename: COLORURINE, APPERANCEUR, LABSPEC, PHURINE, GLUCOSEU, HGBUR, BILIRUBINUR, KETONESUR, PROTEINUR, UROBILINOGEN, NITRITE, LEUKOCYTESUR,  in the last 168 hours  Lipid Panel:  No results found for this basename: chol, trig, hdl, cholhdl, vldl, ldlcalc    HgbA1C:  Lab Results  Component Value Date   HGBA1C 5.6 05/12/2011    Urine Drug Screen:     Component Value Date/Time   LABOPIA NONE DETECTED 04/03/2009 1156   COCAINSCRNUR NONE DETECTED 04/03/2009 1156   LABBENZ POSITIVE* 04/03/2009 1156   AMPHETMU NONE DETECTED 04/03/2009 1156   THCU NONE DETECTED 04/03/2009 1156   LABBARB  Value: NONE DETECTED        DRUG SCREEN FOR MEDICAL PURPOSES ONLY.  IF CONFIRMATION IS NEEDED FOR ANY PURPOSE, NOTIFY LAB WITHIN 5 DAYS.        LOWEST DETECTABLE LIMITS FOR URINE DRUG SCREEN Drug Class       Cutoff (ng/mL) Amphetamine      1000 Barbiturate      200 Benzodiazepine   200 Tricyclics       300 Opiates          300 Cocaine          300 THC              50 04/03/2009 1156    Alcohol Level: No results found for this basename: ETH,  in the last 168 hours  Other results: EKG: sinus rhythm at 56 bpm.  Imaging: Dg Chest 1 View  08/03/2013   CLINICAL DATA:  Altered mental status.  EXAM: CHEST - 1 VIEW  COMPARISON:  Chest x-ray of November 02, 2012.  FINDINGS: The lungs are well-expanded and clear. There is no pneumothorax. There is no pleural effusion. The cardiopericardial silhouette is normal in size. The mediastinum is normal in width. The observed portions of the bony thorax appear normal.  IMPRESSION: There is no evidence of pneumonia nor CHF nor other acute cardiopulmonary abnormality.   Electronically Signed   By: David  [place]   On: 08/03/2013 11:50   Ct Head Wo Contrast  08/03/2013    CLINICAL DATA:  Loss of consciousness, seizure  EXAM: CT HEAD WITHOUT CONTRAST  TECHNIQUE: Contiguous axial images were obtained from the base of the skull through the vertex without intravenous contrast.  COMPARISON:  06/11/2011  FINDINGS: No skull fracture is noted. No intracranial hemorrhage, mass effect or midline shift. Paranasal sinuses and mastoid air cells are unremarkable. Stable cerebral atrophy and chronic white matter disease. No acute cortical infarction. Motion artifact are noted. No mass lesion is noted on this unenhanced scan.  IMPRESSION: No acute intracranial abnormality. Stable atrophy and chronic white matter disease.   Electronically Signed   By: Natasha Mead M.D.   On: 08/03/2013 12:09     Assessment/Plan: 79 year old female presenting after a syncopal episode while seated.  Unclear etiology.  Patient does have a history of a seizure.  Is on Depakote.  Patient is also on Aricept which has a possible side effect of syncope.  Cardiac causes should be ruled out as well.  Head CT reviewed and shows no acute abnormalities.    Recommendations: 1.  EEG 2.  Seizure precautions 3.  Depakote level 4.  Telemetry 5.  Frequent neuro checks 6.  Determine how long patient has been on Aricept to determine need for discontinuation   Thana Farr, MD [facility] [phone] 08/03/2013, 6:40 PM

## 2013-08-04 ENCOUNTER — Observation Stay (HOSPITAL_COMMUNITY): Payer: Medicare Other

## 2013-08-04 DIAGNOSIS — N189 Chronic kidney disease, unspecified: Secondary | ICD-10-CM

## 2013-08-04 DIAGNOSIS — R55 Syncope and collapse: Secondary | ICD-10-CM

## 2013-08-04 DIAGNOSIS — R5381 Other malaise: Secondary | ICD-10-CM

## 2013-08-04 DIAGNOSIS — F0390 Unspecified dementia without behavioral disturbance: Secondary | ICD-10-CM

## 2013-08-04 DIAGNOSIS — R569 Unspecified convulsions: Secondary | ICD-10-CM

## 2013-08-04 DIAGNOSIS — I10 Essential (primary) hypertension: Secondary | ICD-10-CM

## 2013-08-04 LAB — URINE MICROSCOPIC-ADD ON

## 2013-08-04 LAB — URINALYSIS, ROUTINE W REFLEX MICROSCOPIC
Ketones, ur: NEGATIVE mg/dL
Protein, ur: NEGATIVE mg/dL
Urobilinogen, UA: 0.2 mg/dL (ref 0.0–1.0)

## 2013-08-04 MED ORDER — DIVALPROEX SODIUM 125 MG PO CPSP
375.0000 mg | ORAL_CAPSULE | Freq: Two times a day (BID) | ORAL | Status: DC
  Administered 2013-08-05 – 2013-08-06 (×3): 375 mg via ORAL
  Filled 2013-08-04 (×5): qty 3

## 2013-08-04 NOTE — Progress Notes (Signed)
[facility] PROGRESS NOTE  [name] ZOX:[id number] DOB: 10-Apr-1931 DOA: 08/03/2013 PCP: Florentina Jenny, MD  HPI/Subjective: Patient is a pleasantly confused, awake and alert.  Assessment/Plan: Principal Problem:   Syncope Active Problems:   Dementia   HTN (hypertension)   Seizure   Loss of consciousness -Syncope versus probable seizure, CT showed no intracranial abnormalities. -Patient on Depakote, therapeutic levels, per family last seizure was 5 years ago. -EEG showed epileptogenic focus but no active seizure. Neurology following.  Hypertension -Continue home medications.  Advanced dementia -Continue home medication, neurology looking for how long patient being on Aricept  Bradycardia -Likely secondary to beta blockers, dose decreased to 25 mg. Monitor on telemetry.  Code Status: Full code Family Communication: Plan discussed with the patient. Disposition Plan: Remains inpatient   Consultants:  Neurology  Procedures:  EKG showed no evidence of seizure but probable epileptogenic focus in the frontal lobe  Antibiotics:  None   Objective: Filed Vitals:   08/04/13 1104  BP: 119/57  Pulse: 72  Temp:   Resp:    No intake or output data in the 24 hours ending 08/04/13 1352 There were no vitals filed for this visit.  Exam: General: Alert and awake, confused, not in any acute distress. HEENT: anicteric sclera, pupils reactive to light and accommodation, EOMI CVS: S1-S2 clear, no murmur rubs or gallops Chest: clear to auscultation bilaterally, no wheezing, rales or rhonchi Abdomen: soft nontender, nondistended, normal bowel sounds, no organomegaly Extremities: no cyanosis, clubbing or edema noted bilaterally Neuro: Cranial nerves II-XII intact, no focal neurological deficits  Data Reviewed: Basic Metabolic Panel:  Recent Labs Lab 08/03/13 1104 08/03/13 1635  NA 133*  --   K 4.8  --   CL 95*  --   CO2 29  --   GLUCOSE 93  --   BUN 31*   --   CREATININE 1.07 1.05  CALCIUM 9.3  --    Liver Function Tests: No results found for this basename: AST, ALT, ALKPHOS, BILITOT, PROT, ALBUMIN,  in the last 168 hours No results found for this basename: LIPASE, AMYLASE,  in the last 168 hours No results found for this basename: AMMONIA,  in the last 168 hours CBC:  Recent Labs Lab 08/03/13 1104  WBC 6.7  NEUTROABS 4.0  HGB 12.1  HCT 35.7*  MCV 88.8  PLT 214   Cardiac Enzymes: No results found for this basename: CKTOTAL, CKMB, CKMBINDEX, TROPONINI,  in the last 168 hours BNP (last 3 results)  Recent Labs  10/31/12 1317 08/03/13 1104  PROBNP 736.1* 539.8*   CBG: No results found for this basename: GLUCAP,  in the last 168 hours  Micro No results found for this or any previous visit (from the past 240 hour(s)).   Studies: Dg Chest 1 View  08/03/2013   CLINICAL DATA:  Altered mental status.  EXAM: CHEST - 1 VIEW  COMPARISON:  Chest x-ray of November 02, 2012.  FINDINGS: The lungs are well-expanded and clear. There is no pneumothorax. There is no pleural effusion. The cardiopericardial silhouette is normal in size. The mediastinum is normal in width. The observed portions of the bony thorax appear normal.  IMPRESSION: There is no evidence of pneumonia nor CHF nor other acute cardiopulmonary abnormality.   Electronically Signed   By: David  [place]   On: 08/03/2013 11:50   Ct Head Wo Contrast  08/03/2013   CLINICAL DATA:  Loss of consciousness, seizure  EXAM: CT HEAD WITHOUT CONTRAST  TECHNIQUE: Contiguous axial  images were obtained from the base of the skull through the vertex without intravenous contrast.  COMPARISON:  06/11/2011  FINDINGS: No skull fracture is noted. No intracranial hemorrhage, mass effect or midline shift. Paranasal sinuses and mastoid air cells are unremarkable. Stable cerebral atrophy and chronic white matter disease. No acute cortical infarction. Motion artifact are noted. No mass lesion is noted on this  unenhanced scan.  IMPRESSION: No acute intracranial abnormality. Stable atrophy and chronic white matter disease.   Electronically Signed   By: Natasha Mead M.D.   On: 08/03/2013 12:09    Scheduled Meds: . amLODipine  5 mg Oral Daily  . aspirin EC  81 mg Oral QODAY  . atorvastatin  10 mg Oral q1800  . cholecalciferol  1,000 Units Oral Daily  . divalproex  250 mg Oral BID  . docusate sodium  100 mg Oral BID  . donepezil  5 mg Oral QHS  . enoxaparin (LOVENOX) injection  40 mg Subcutaneous Q24H  . ferrous sulfate  325 mg Oral QHS  . folic acid  1 mg Oral QODAY  . metoprolol tartrate  25 mg Oral BID  . montelukast  10 mg Oral QHS  . pantoprazole  40 mg Oral Daily  . sodium chloride  3 mL Intravenous Q12H   Continuous Infusions:      Time spent: 35 minutes    [facility] A  [facility] Pager [phone] If 7PM-7AM, please contact night-coverage at [email or web address], password [facility] 08/04/2013, 1:52 PM  LOS: 1 day

## 2013-08-04 NOTE — Progress Notes (Signed)
Routine EEG completed.  

## 2013-08-04 NOTE — Procedures (Signed)
History: 79 yo F with a history of seizure presents with syncope.   Background: The background consists of intermixed alpha and beta activities during periods of maximal wakefullness, though the preponderance of this study is drowsiness/sleep. There is a well defined posterior dominant rhythm of 8.5 Hz. There are occasional sharp waves at T8, F8 with involvement of F4 at times.    Photic stimulation: Physiologic driving is not performed  EEG Abnormalities: 1) Left frontotemporal sharp waves  Clinical Interpretation: This EEG is consistent with a potential focus of epileptogenicity in the left frontotemporal region.  There was no seizure  recorded on this study.   Ritta Slot, MD [facility] [phone]  If 7pm- 7am, please page neurology on call at [phone].

## 2013-08-04 NOTE — Clinical Social Work Psychosocial (Signed)
Clinical Social Work Department BRIEF PSYCHOSOCIAL ASSESSMENT 08/04/2013  Patient:  [name], [name]     Account Number:  [id number]     Admit date:  08/03/2013  Clinical Social Worker:  Lavell Luster  Date/Time:  08/04/2013 11:46 AM  Referred by:  Physician  Date Referred:  08/04/2013 Referred for  ALF Placement   Other Referral:   Interview type:  Other - See comment Other interview type:   Patient not oriented at time of assessment. CSW interviewed daughter.    PSYCHOSOCIAL DATA Living Status:  FACILITY Admitted from facility:  [facility] Level of care:  Assisted Living Primary support name:  [name] [phone] Primary support relationship to patient:  CHILD, ADULT Degree of support available:   Support is good.    CURRENT CONCERNS Current Concerns  Post-Acute Placement   Other Concerns:    SOCIAL WORK ASSESSMENT / PLAN CSW spoke with daughter via phone to complete assessment. Patient admitted from [facility]. Daughter plans for patient to return upon DC. Patient also has a husband. CSW answered daughters questions and explained that patient's information will be sent to ALF so they can determine whether it will be appropriate for patient to return upon DC.   Assessment/plan status:  Psychosocial Support/Ongoing Assessment of Needs Other assessment/ plan:   Complete FL2, Fax   Information/referral to community resources:   CSW contact information given to patient.    PATIENT'S/FAMILY'S RESPONSE TO PLAN OF CARE: Patients daughter plans for patient to return to ALF when discharged. Daughter was pleasant, appropriate, and appreciative of CSW contact. Daughter awaits [employer]. CSW will follow.       Roddie Mc, [place], [place], [phone]

## 2013-08-04 NOTE — Progress Notes (Signed)
UR completed 

## 2013-08-04 NOTE — Progress Notes (Signed)
Subjective: No further events.   Exam: Filed Vitals:   08/04/13 1405  BP: 158/81  Pulse: 81  Temp: 98.7 F (37.1 C)  Resp: 18   Gen: In bed, NAD MS: Awake, alert, not oriented to place or year XB:JYNWG, responds to stimuli in both visual fields, EOMI Motor: moves all extremities equally with good strength.  Sensory:intact to LT  EEG reviewed, shows right sided sharp waves.    Impression: 79 yo F with previous seizure and new onset LOC of unclear etiology. With abnormal EEG, seizure is likely with the nsg hiome staff finding her post-ictally(LOC unwitnessed). Though she has been mildly bradycardic, I would not favor stopping aricept at this time given that there is another likely diagnosis.   Recommendations: 1) Increase depakote to 375mg  BID.  2) Please call if further questions or episodes of concern, otherwise neurology will sign off.   Ritta Slot, MD [facility] [phone]  If 7pm- 7am, please page neurology on call at [phone].

## 2013-08-05 LAB — CBC
MCHC: 33.1 g/dL (ref 30.0–36.0)
MCV: 90.1 fL (ref 78.0–100.0)
Platelets: 177 10*3/uL (ref 150–400)
RBC: 3.65 MIL/uL — ABNORMAL LOW (ref 3.87–5.11)
RDW: 13.2 % (ref 11.5–15.5)
WBC: 6.9 10*3/uL (ref 4.0–10.5)

## 2013-08-05 LAB — BASIC METABOLIC PANEL
BUN: 31 mg/dL — ABNORMAL HIGH (ref 6–23)
CO2: 30 mEq/L (ref 19–32)
Calcium: 8.9 mg/dL (ref 8.4–10.5)
Creatinine, Ser: 1.15 mg/dL — ABNORMAL HIGH (ref 0.50–1.10)
GFR calc Af Amer: 51 mL/min — ABNORMAL LOW (ref >90)

## 2013-08-05 NOTE — Progress Notes (Signed)
[facility] PROGRESS NOTE  [name] VHQ:[id number] DOB: 1931/01/11 DOA: 08/03/2013 PCP: Florentina Jenny, MD  HPI/Subjective: Patient is a pleasantly confused, awake and alert. Seen while getting fed by nurse tech.  Assessment/Plan: Principal Problem:   Syncope Active Problems:   Dementia   HTN (hypertension)   Seizure   Loss of consciousness -Syncope versus probable seizure, CT showed no intracranial abnormalities. -Patient on Depakote, therapeutic levels, per family last seizure was 5 years ago. -EEG showed epileptogenic focus but no active seizure. -Neurology recommended to increase Depakote to 375 mg twice a day.  Hypertension -Continue home medications.  Advanced dementia -Continue home medication, neurology looking for how long patient being on Aricept  Bradycardia -Likely secondary to beta blockers, dose decreased to 25 mg. Monitor on telemetry.  Code Status: Full code Family Communication: Plan discussed with the patient. Disposition Plan: Remains inpatient, she will be appropriate for discharge in a.m.   Consultants:  Neurology  Procedures:  EKG showed no evidence of seizure but probable epileptogenic focus in the frontal lobe  Antibiotics:  None   Objective: Filed Vitals:   08/05/13 0701  BP: 167/76  Pulse: 75  Temp: 98.7 F (37.1 C)  Resp: 18    Intake/Output Summary (Last 24 hours) at 08/05/13 1043 Last data filed at 08/05/13 4132  Gross per 24 hour  Intake    180 ml  Output      0 ml  Net    180 ml   There were no vitals filed for this visit.  Exam: General: Alert and awake, confused, not in any acute distress. HEENT: anicteric sclera, pupils reactive to light and accommodation, EOMI CVS: S1-S2 clear, no murmur rubs or gallops Chest: clear to auscultation bilaterally, no wheezing, rales or rhonchi Abdomen: soft nontender, nondistended, normal bowel sounds, no organomegaly Extremities: no cyanosis, clubbing or edema  noted bilaterally Neuro: Cranial nerves II-XII intact, no focal neurological deficits  Data Reviewed: Basic Metabolic Panel:  Recent Labs Lab 08/03/13 1104 08/03/13 1635 08/05/13 0448  NA 133*  --  137  K 4.8  --  4.5  CL 95*  --  100  CO2 29  --  30  GLUCOSE 93  --  82  BUN 31*  --  31*  CREATININE 1.07 1.05 1.15*  CALCIUM 9.3  --  8.9   Liver Function Tests: No results found for this basename: AST, ALT, ALKPHOS, BILITOT, PROT, ALBUMIN,  in the last 168 hours No results found for this basename: LIPASE, AMYLASE,  in the last 168 hours No results found for this basename: AMMONIA,  in the last 168 hours CBC:  Recent Labs Lab 08/03/13 1104 08/05/13 0448  WBC 6.7 6.9  NEUTROABS 4.0  --   HGB 12.1 10.9*  HCT 35.7* 32.9*  MCV 88.8 90.1  PLT 214 177   Cardiac Enzymes: No results found for this basename: CKTOTAL, CKMB, CKMBINDEX, TROPONINI,  in the last 168 hours BNP (last 3 results)  Recent Labs  10/31/12 1317 08/03/13 1104  PROBNP 736.1* 539.8*   CBG: No results found for this basename: GLUCAP,  in the last 168 hours  Micro No results found for this or any previous visit (from the past 240 hour(s)).   Studies: Dg Chest 1 View  08/03/2013   CLINICAL DATA:  Altered mental status.  EXAM: CHEST - 1 VIEW  COMPARISON:  Chest x-ray of November 02, 2012.  FINDINGS: The lungs are well-expanded and clear. There is no pneumothorax. There is no pleural  effusion. The cardiopericardial silhouette is normal in size. The mediastinum is normal in width. The observed portions of the bony thorax appear normal.  IMPRESSION: There is no evidence of pneumonia nor CHF nor other acute cardiopulmonary abnormality.   Electronically Signed   By: David  [place]   On: 08/03/2013 11:50   Ct Head Wo Contrast  08/03/2013   CLINICAL DATA:  Loss of consciousness, seizure  EXAM: CT HEAD WITHOUT CONTRAST  TECHNIQUE: Contiguous axial images were obtained from the base of the skull through the vertex  without intravenous contrast.  COMPARISON:  06/11/2011  FINDINGS: No skull fracture is noted. No intracranial hemorrhage, mass effect or midline shift. Paranasal sinuses and mastoid air cells are unremarkable. Stable cerebral atrophy and chronic white matter disease. No acute cortical infarction. Motion artifact are noted. No mass lesion is noted on this unenhanced scan.  IMPRESSION: No acute intracranial abnormality. Stable atrophy and chronic white matter disease.   Electronically Signed   By: Natasha Mead M.D.   On: 08/03/2013 12:09    Scheduled Meds: . amLODipine  5 mg Oral Daily  . aspirin EC  81 mg Oral QODAY  . atorvastatin  10 mg Oral q1800  . cholecalciferol  1,000 Units Oral Daily  . divalproex  375 mg Oral BID  . docusate sodium  100 mg Oral BID  . donepezil  5 mg Oral QHS  . enoxaparin (LOVENOX) injection  40 mg Subcutaneous Q24H  . ferrous sulfate  325 mg Oral QHS  . folic acid  1 mg Oral QODAY  . metoprolol tartrate  25 mg Oral BID  . montelukast  10 mg Oral QHS  . pantoprazole  40 mg Oral Daily  . sodium chloride  3 mL Intravenous Q12H   Continuous Infusions:      Time spent: 35 minutes    [facility] A  [facility] Pager [phone] If 7PM-7AM, please contact night-coverage at [email or web address], password [facility] 08/05/2013, 10:43 AM  LOS: 2 days

## 2013-08-06 MED ORDER — DIVALPROEX SODIUM 125 MG PO CPSP: 375.0000 mg | ORAL_CAPSULE | Freq: Two times a day (BID) | ORAL | Status: DC

## 2013-08-06 MED ORDER — METOPROLOL TARTRATE 50 MG PO TABS: 25.0000 mg | ORAL_TABLET | Freq: Two times a day (BID) | ORAL | Status: DC

## 2013-08-06 NOTE — Clinical Social Work Note (Signed)
Per MD patient ready to DC back to [facility] ALF. RN, daughter, and facility notified of DC. RN given number for report. Ambulance transport requested for patient. DC packet left with patient's chart. CSW signing off at this time.   Roddie Mc, [place], [place], [phone]

## 2013-08-06 NOTE — Care Management Note (Addendum)
    Page 1 of 1   08/06/2013     11:56:23 AM   CARE MANAGEMENT NOTE 08/06/2013  Patient:  [name], [name]   Account Number:  [id number]  Date Initiated:  08/06/2013  Documentation initiated by:  Letha Cape  Subjective/Objective Assessment:   dx syncope  admit as observation- from [facility] ALF- (memory care)     Action/Plan:   Anticipated DC Date:  08/06/2013   Anticipated DC Plan:  ASSISTED LIVING / REST HOME  In-house referral  Clinical Social Worker      DC Planning Services  CM consult      Choice offered to / List presented to:             Status of service:  Completed, signed off Medicare Important Message given?   (If response is "NO", the following Medicare IM given date fields will be blank) Date Medicare IM given:   Date Additional Medicare IM given:    Discharge Disposition:  ASSISTED LIVING  Per UR Regulation:  Reviewed for med. necessity/level of care/duration of stay  If discussed at Long Length of Stay Meetings, dates discussed:    Comments:  08/06/13 11:51 Letha Cape RN, BSN [phone] patient for dc back to ALF [place], CSW following.

## 2013-08-06 NOTE — Discharge Summary (Addendum)
Physician Discharge Summary  Rosary Filosa ZOX:[id number] DOB: Dec 03, 1930 DOA: 08/03/2013  PCP: Florentina Jenny, MD  Admit date: 08/03/2013 Discharge date: 08/06/2013  Time spent: 40 minutes  Recommendations for Outpatient Follow-up:  1. Followup with primary care physician within one week.  Discharge Diagnoses:  Principal Problem:   Syncope Active Problems:   Dementia   HTN (hypertension)   Seizure   Discharge Condition: Stable  Diet recommendation: October the diet  There were no vitals filed for this visit.  History of present illness:  [name] is a 79 y.o. female with PMH of advanced dementia, HTN, seizure, presented with syncope; Per the nursing home the patient was found sitting at the table with her head on the table completely unconscious. The nursing home states this was lasting for approximately 10 minutes. When she started waking up her tone start decreasing a little and after another 10 minutes she started to come back to her normal self; no h/o fever, no chest pain, no SOB, no nausea vomiting or diarrhea.  Hospital Course:   1. Loss of consciousness: When patient brought to the hospital for the secondary to syncope versus probable seizure, CT scan of the head showed no intracranial abnormalities. Patient is taking Depakote, as well as on therapeutic levels per family I see a patient had was 5 years ago. Neurology was consulted, EEG was done and showed epileptogenic focus but no active seizure. The loss of consciousness thought to be likely related to seizures so Depakote increased to 375 mg by mouth twice a day.  2. Hypertension: Home medications continued, the metoprolol decreased to 25 mg twice a day because of bradycardia.  3. Advanced dementia: Home medications continued, neurology was concerned about Aricept because it can cause Syncope. Patient was on this medication for long-term such was not changed.  4. Bradycardia: Likely secondary to beta blockers  and Aricept, patient was on metoprolol 50 mg that was decreased to 25 mg on discharge.  Procedures:  EEG showed no evidence of seizure but probable epileptogenic focus in the frontal lobe  Consultations:  Onalee Hua of neurology  Discharge Exam: Filed Vitals:   08/05/13 2033  BP: 145/67  Pulse: 77  Temp: 97.9 F (36.6 C)  Resp: 18   General: Alert and awake, oriented x3, not in any acute distress. HEENT: anicteric sclera, pupils reactive to light and accommodation, EOMI CVS: S1-S2 clear, no murmur rubs or gallops Chest: clear to auscultation bilaterally, no wheezing, rales or rhonchi Abdomen: soft nontender, nondistended, normal bowel sounds, no organomegaly Extremities: no cyanosis, clubbing or edema noted bilaterally Neuro: Cranial nerves II-XII intact, no focal neurological deficits  Discharge Instructions      Discharge Orders   Future Orders Complete By Expires   Diet - low sodium heart healthy  As directed    Increase activity slowly  As directed        Medication List         albuterol 108 (90 BASE) MCG/ACT inhaler  Commonly known as:  PROVENTIL HFA;VENTOLIN HFA  Inhale 2 puffs into the lungs 2 (two) times daily.     albuterol 108 (90 BASE) MCG/ACT inhaler  Commonly known as:  PROVENTIL HFA;VENTOLIN HFA  Inhale 2 puffs into the lungs every 4 (four) hours as needed for wheezing or shortness of breath.     amLODipine 5 MG tablet  Commonly known as:  NORVASC  Take 1 tablet (5 mg total) by mouth daily.     aspirin EC 81 MG tablet  Take 81 mg by mouth every other day.     cholecalciferol 1000 UNITS tablet  Commonly known as:  VITAMIN D  Take 1,000 Units by mouth daily.     divalproex 125 MG capsule  Commonly known as:  DEPAKOTE SPRINKLE  Take 3 capsules (375 mg total) by mouth 2 (two) times daily. Sprinkle on food     docusate sodium 100 MG capsule  Commonly known as:  COLACE  Take 100 mg by mouth 2 (two) times daily.     donepezil 5 MG  tablet  Commonly known as:  ARICEPT  Take 5 mg by mouth at bedtime.     ferrous sulfate 325 (65 FE) MG tablet  Take 325 mg by mouth 2 (two) times daily.     folic acid 1 MG tablet  Commonly known as:  FOLVITE  Take 1 mg by mouth every other day.     losartan 100 MG tablet  Commonly known as:  COZAAR  Take 100 mg by mouth daily.     metoprolol 50 MG tablet  Commonly known as:  LOPRESSOR  Take 0.5 tablets (25 mg total) by mouth 2 (two) times daily.     montelukast 10 MG tablet  Commonly known as:  SINGULAIR  Take 10 mg by mouth at bedtime.     ONE-A-DAY WOMENS PO  Take 1 tablet by mouth daily at 3 pm.     pantoprazole 40 MG tablet  Commonly known as:  PROTONIX  Take 40 mg by mouth daily.     rosuvastatin 10 MG tablet  Commonly known as:  CRESTOR  Take 10 mg by mouth daily.       No Known Allergies Follow-up Information   Follow up with Florentina Jenny, MD In 1 week.   Specialty:  Family Medicine   Contact information:   [address] [place] [address] [phone]        The results of significant diagnostics from this hospitalization (including imaging, microbiology, ancillary and laboratory) are listed below for reference.    Significant Diagnostic Studies: Dg Chest 1 View  08/03/2013   CLINICAL DATA:  Altered mental status.  EXAM: CHEST - 1 VIEW  COMPARISON:  Chest x-ray of November 02, 2012.  FINDINGS: The lungs are well-expanded and clear. There is no pneumothorax. There is no pleural effusion. The cardiopericardial silhouette is normal in size. The mediastinum is normal in width. The observed portions of the bony thorax appear normal.  IMPRESSION: There is no evidence of pneumonia nor CHF nor other acute cardiopulmonary abnormality.   Electronically Signed   By: David  [place]   On: 08/03/2013 11:50   Ct Head Wo Contrast  08/03/2013   CLINICAL DATA:  Loss of consciousness, seizure  EXAM: CT HEAD WITHOUT CONTRAST  TECHNIQUE: Contiguous axial  images were obtained from the base of the skull through the vertex without intravenous contrast.  COMPARISON:  06/11/2011  FINDINGS: No skull fracture is noted. No intracranial hemorrhage, mass effect or midline shift. Paranasal sinuses and mastoid air cells are unremarkable. Stable cerebral atrophy and chronic white matter disease. No acute cortical infarction. Motion artifact are noted. No mass lesion is noted on this unenhanced scan.  IMPRESSION: No acute intracranial abnormality. Stable atrophy and chronic white matter disease.   Electronically Signed   By: Natasha Mead M.D.   On: 08/03/2013 12:09    Microbiology: No results found for this or any previous visit (from the past 240 hour(s)).  Labs: Basic Metabolic Panel:  Recent Labs Lab 08/03/13 1104 08/03/13 1635 08/05/13 0448  NA 133*  --  137  K 4.8  --  4.5  CL 95*  --  100  CO2 29  --  30  GLUCOSE 93  --  82  BUN 31*  --  31*  CREATININE 1.07 1.05 1.15*  CALCIUM 9.3  --  8.9   Liver Function Tests: No results found for this basename: AST, ALT, ALKPHOS, BILITOT, PROT, ALBUMIN,  in the last 168 hours No results found for this basename: LIPASE, AMYLASE,  in the last 168 hours No results found for this basename: AMMONIA,  in the last 168 hours CBC:  Recent Labs Lab 08/03/13 1104 08/05/13 0448  WBC 6.7 6.9  NEUTROABS 4.0  --   HGB 12.1 10.9*  HCT 35.7* 32.9*  MCV 88.8 90.1  PLT 214 177   Cardiac Enzymes: No results found for this basename: CKTOTAL, CKMB, CKMBINDEX, TROPONINI,  in the last 168 hours BNP: BNP (last 3 results)  Recent Labs  10/31/12 1317 08/03/13 1104  PROBNP 736.1* 539.8*   CBG: No results found for this basename: GLUCAP,  in the last 168 hours     Signed:  Sevannah Madia A  [facility] 08/06/2013, 10:25 AM

## 2013-08-06 NOTE — Progress Notes (Addendum)
Elenora Fender to be D/C'd to [facility] assisted living per MD order.  Discussed with the daughter and all questions fully answered. Gave report to RN at [place] at 1318.      Medication List         albuterol 108 (90 BASE) MCG/ACT inhaler  Commonly known as:  PROVENTIL HFA;VENTOLIN HFA  Inhale 2 puffs into the lungs 2 (two) times daily.     albuterol 108 (90 BASE) MCG/ACT inhaler  Commonly known as:  PROVENTIL HFA;VENTOLIN HFA  Inhale 2 puffs into the lungs every 4 (four) hours as needed for wheezing or shortness of breath.     amLODipine 5 MG tablet  Commonly known as:  NORVASC  Take 1 tablet (5 mg total) by mouth daily.     aspirin EC 81 MG tablet  Take 81 mg by mouth every other day.     cholecalciferol 1000 UNITS tablet  Commonly known as:  VITAMIN D  Take 1,000 Units by mouth daily.     divalproex 125 MG capsule  Commonly known as:  DEPAKOTE SPRINKLE  Take 3 capsules (375 mg total) by mouth 2 (two) times daily. Sprinkle on food     docusate sodium 100 MG capsule  Commonly known as:  COLACE  Take 100 mg by mouth 2 (two) times daily.     donepezil 5 MG tablet  Commonly known as:  ARICEPT  Take 5 mg by mouth at bedtime.     ferrous sulfate 325 (65 FE) MG tablet  Take 325 mg by mouth 2 (two) times daily.     folic acid 1 MG tablet  Commonly known as:  FOLVITE  Take 1 mg by mouth every other day.     losartan 100 MG tablet  Commonly known as:  COZAAR  Take 100 mg by mouth daily.     metoprolol 50 MG tablet  Commonly known as:  LOPRESSOR  Take 0.5 tablets (25 mg total) by mouth 2 (two) times daily.     montelukast 10 MG tablet  Commonly known as:  SINGULAIR  Take 10 mg by mouth at bedtime.     ONE-A-DAY WOMENS PO  Take 1 tablet by mouth daily at 3 pm.     pantoprazole 40 MG tablet  Commonly known as:  PROTONIX  Take 40 mg by mouth daily.     rosuvastatin 10 MG tablet  Commonly known as:  CRESTOR  Take 10 mg by mouth daily.        VVS, Skin  clean, dry and intact without evidence of skin break down, no evidence of skin tears noted. IV catheter discontinued intact. Site without signs and symptoms of complications. Dressing and pressure applied.  An After Visit Summary was printed and given to the patient.  D/c education completed with patient/family including follow up instructions, medication list, d/c activities limitations if indicated, with other d/c instructions as indicated by MD - patient able to verbalize understanding, all questions fully answered.   Patient instructed to return to ED, call 911, or call MD for any changes in condition.   Patient to be escorted via EMS, and D/C to assisted living facility.  Dalbert Mayotte 08/06/2013 1:19 PM

## 2014-02-01 ENCOUNTER — Emergency Department (HOSPITAL_COMMUNITY)
Admission: EM | Admit: 2014-02-01 | Discharge: 2014-02-02 | Disposition: A | Discharge status: Home / Self Care | Payer: Medicare Other | Attending: Emergency Medicine | Admitting: Emergency Medicine

## 2014-02-01 ENCOUNTER — Encounter (HOSPITAL_COMMUNITY): Payer: Self-pay | Admitting: Emergency Medicine

## 2014-02-01 ENCOUNTER — Emergency Department (HOSPITAL_COMMUNITY): Payer: Medicare Other

## 2014-02-01 DIAGNOSIS — E7800 Pure hypercholesterolemia, unspecified: Secondary | ICD-10-CM | POA: Insufficient documentation

## 2014-02-01 DIAGNOSIS — W19XXXA Unspecified fall, initial encounter: Secondary | ICD-10-CM

## 2014-02-01 DIAGNOSIS — S0083XA Contusion of other part of head, initial encounter: Principal | ICD-10-CM | POA: Insufficient documentation

## 2014-02-01 DIAGNOSIS — J45909 Unspecified asthma, uncomplicated: Secondary | ICD-10-CM | POA: Insufficient documentation

## 2014-02-01 DIAGNOSIS — Z792 Long term (current) use of antibiotics: Secondary | ICD-10-CM | POA: Insufficient documentation

## 2014-02-01 DIAGNOSIS — Z79899 Other long term (current) drug therapy: Secondary | ICD-10-CM | POA: Insufficient documentation

## 2014-02-01 DIAGNOSIS — Z880 Allergy status to penicillin: Secondary | ICD-10-CM | POA: Insufficient documentation

## 2014-02-01 DIAGNOSIS — Z7982 Long term (current) use of aspirin: Secondary | ICD-10-CM | POA: Insufficient documentation

## 2014-02-01 DIAGNOSIS — G309 Alzheimer's disease, unspecified: Secondary | ICD-10-CM | POA: Insufficient documentation

## 2014-02-01 DIAGNOSIS — S0003XA Contusion of scalp, initial encounter: Secondary | ICD-10-CM | POA: Insufficient documentation

## 2014-02-01 DIAGNOSIS — T148XXA Other injury of unspecified body region, initial encounter: Secondary | ICD-10-CM

## 2014-02-01 DIAGNOSIS — Z8673 Personal history of transient ischemic attack (TIA), and cerebral infarction without residual deficits: Secondary | ICD-10-CM | POA: Insufficient documentation

## 2014-02-01 DIAGNOSIS — F0280 Dementia in other diseases classified elsewhere without behavioral disturbance: Secondary | ICD-10-CM | POA: Insufficient documentation

## 2014-02-01 DIAGNOSIS — Z23 Encounter for immunization: Secondary | ICD-10-CM | POA: Insufficient documentation

## 2014-02-01 DIAGNOSIS — Y929 Unspecified place or not applicable: Secondary | ICD-10-CM | POA: Insufficient documentation

## 2014-02-01 DIAGNOSIS — W050XXA Fall from non-moving wheelchair, initial encounter: Secondary | ICD-10-CM | POA: Insufficient documentation

## 2014-02-01 DIAGNOSIS — Z8701 Personal history of pneumonia (recurrent): Secondary | ICD-10-CM | POA: Insufficient documentation

## 2014-02-01 DIAGNOSIS — Y939 Activity, unspecified: Secondary | ICD-10-CM | POA: Insufficient documentation

## 2014-02-01 DIAGNOSIS — K219 Gastro-esophageal reflux disease without esophagitis: Secondary | ICD-10-CM | POA: Insufficient documentation

## 2014-02-01 DIAGNOSIS — S1093XA Contusion of unspecified part of neck, initial encounter: Principal | ICD-10-CM

## 2014-02-01 DIAGNOSIS — I10 Essential (primary) hypertension: Secondary | ICD-10-CM | POA: Insufficient documentation

## 2014-02-01 DIAGNOSIS — IMO0002 Reserved for concepts with insufficient information to code with codable children: Secondary | ICD-10-CM | POA: Insufficient documentation

## 2014-02-01 MED ORDER — TETANUS-DIPHTH-ACELL PERTUSSIS 5-2.5-18.5 LF-MCG/0.5 IM SUSP
0.5000 mL | Freq: Once | INTRAMUSCULAR | Status: AC
  Administered 2014-02-02: 0.5 mL via INTRAMUSCULAR
  Filled 2014-02-01: qty 0.5

## 2014-02-01 NOTE — ED Notes (Signed)
[facility] Staff at facility was pushing pt in wheelchair and pt fell out of wheelchair onto carpeted surface, head first. EMS reports that pt was laying on the ground on her left side the entire time until EMS arrived. Pt has abrasion to left forehead and swelling. Pt is at baseline, hx of dementia and alzheimers. Rt and lt arms contracted. Follows commands. No blood thinners. 154/102 HR 61 RR 16

## 2014-02-01 NOTE — ED Notes (Signed)
Pt taken to CT.

## 2014-02-01 NOTE — ED Provider Notes (Signed)
CSN: [id number]     Arrival date & time 02/01/14  2223 History   First MD Initiated Contact with Patient 02/01/14 2243     Chief Complaint  Patient presents with  . Fall     (Consider location/radiation/quality/duration/timing/severity/associated sxs/prior Treatment) HPI  Jayleah Garbers is a(n) 80 y.o. female who presents to the ED from snf after fall. pmh of dementia. History is gathered form EMS report patient fell out of her wheelchair onto carpet. She was found on the carpet by EMS with abrasion to the left forehead. Hypertensive. No other signs of trauma.   Past Medical History  Diagnosis Date  . Alzheimer disease   . Dementia   . Hypertension   . Hypercholesteremia   . GERD (gastroesophageal reflux disease)   . Hyponatremia   . Asthma   . Borderline diabetes   . Seizures     "one 5 years ago; none since as far as I know" (08/03/2013)  . Stroke     "said she had a mini stroke" (08/03/2013)  . Pneumonia 10/2012   Past Surgical History  Procedure Laterality Date  . Cataract extraction    . Total knee arthroplasty Bilateral   . Cataract extraction w/ intraocular lens  implant, bilateral Bilateral    No family history on file. History  Substance Use Topics  . Smoking status: Never Smoker   . Smokeless tobacco: Never Used  . Alcohol Use: No   OB History   Grav Para Term Preterm Abortions TAB SAB Ect Mult Living                 Review of Systems  Unable to review due to dementia   Allergies  Penicillins  Home Medications   Prior to Admission medications   Medication Sig Start Date End Date Taking? Authorizing Provider  albuterol (PROVENTIL HFA;VENTOLIN HFA) 108 (90 BASE) MCG/ACT inhaler Inhale 2 puffs into the lungs 2 (two) times daily.     Yes Historical Provider, MD  albuterol (PROVENTIL HFA;VENTOLIN HFA) 108 (90 BASE) MCG/ACT inhaler Inhale 2 puffs into the lungs every 4 (four) hours as needed for wheezing or shortness of breath.   Yes Historical  Provider, MD  amLODipine (NORVASC) 5 MG tablet Take 1 tablet (5 mg total) by mouth daily. 11/05/12  Yes Renae Fickle, MD  aspirin EC 81 MG tablet Take 81 mg by mouth every other day.   Yes Historical Provider, MD  cholecalciferol (VITAMIN D) 1000 UNITS tablet Take 1,000 Units by mouth daily.     Yes Historical Provider, MD  divalproex (DEPAKOTE SPRINKLE) 125 MG capsule Take 125 mg by mouth 2 (two) times daily.   Yes Historical Provider, MD  docusate sodium (COLACE) 100 MG capsule Take 100 mg by mouth 2 (two) times daily.     Yes Historical Provider, MD  donepezil (ARICEPT) 5 MG tablet Take 5 mg by mouth at bedtime.   Yes Historical Provider, MD  ferrous sulfate 325 (65 FE) MG tablet Take 325 mg by mouth 2 (two) times daily.    Yes Historical Provider, MD  folic acid (FOLVITE) 1 MG tablet Take 1 mg by mouth every other day.     Yes Historical Provider, MD  losartan (COZAAR) 100 MG tablet Take 100 mg by mouth daily.     Yes Historical Provider, MD  metoprolol (LOPRESSOR) 50 MG tablet Take 0.5 tablets (25 mg total) by mouth 2 (two) times daily. 08/06/13  Yes Clydia Llano, MD  montelukast (SINGULAIR) 10 MG tablet  Take 10 mg by mouth at bedtime.     Yes Historical Provider, MD  Multiple Vitamin (MULTIVITAMIN WITH MINERALS) TABS tablet Take 1 tablet by mouth daily.   Yes Historical Provider, MD  pantoprazole (PROTONIX) 40 MG tablet Take 40 mg by mouth daily.     Yes Historical Provider, MD  rosuvastatin (CRESTOR) 10 MG tablet Take 10 mg by mouth daily.     Yes Historical Provider, MD  sulfamethoxazole-trimethoprim (BACTRIM DS,SEPTRA DS) 800-160 MG per tablet Take 1 tablet by mouth 2 (two) times daily.   Yes Historical Provider, MD   BP 131/55  Pulse 65  Temp(Src) 97.3 F (36.3 C) (Axillary)  Resp 18  SpO2 100% Physical Exam Physical Exam  Nursing note and vitals reviewed. Constitutional: Pleasantly demented elderly female in NAD, HENT:  Head: Normocephalic  Large hematoma present over Left  eye with 4cm cental skin tear  Eyes: Conjunctivae normal and EOM are normal. Pupils are equal, round, and reactive to light. No scleral icterus.  Neck: Normal range of motion.  Cardiovascular: Normal rate, regular rhythm and normal heart sounds.  Exam reveals no gallop and no friction rub.   No murmur heard. Pulmonary/Chest: Effort normal and breath sounds normal. No respiratory distress.  Abdominal: Soft. Bowel sounds are normal. She exhibits no distension and no mass. There is no tenderness. There is no guarding.  Neurological: She is alert and oriented to person, place, and time.  Skin: Skin is warm and dry. She is not diaphoretic.    ED Course  Procedures (including critical care time) Labs Review Labs Reviewed - No data to display  Imaging Review Ct Head Wo Contrast  02/02/2014   CLINICAL DATA:  Fall from wheelchair on to carpeted surface. Dementia. Arm contracture.  EXAM: CT HEAD WITHOUT CONTRAST  CT CERVICAL SPINE WITHOUT CONTRAST  TECHNIQUE: Multidetector CT imaging of the head and cervical spine was performed following the standard protocol without intravenous contrast. Multiplanar CT image reconstructions of the cervical spine were also generated.  COMPARISON:  CT of the head August 03, 2013  FINDINGS: CT HEAD FINDINGS  Moderately motion degraded examination required Re imaging.  Severe ventriculomegaly, likely on the basis of global parenchymal brain volume loss, unchanged. No intraparenchymal hemorrhage, mass effect or midline shift. No acute large vascular territory infarcts. At least mild to moderate white matter changes suggest chronic small vessel ischemic disease though motion limits evaluation.  No abnormal extra-axial fluid collections. Basal cisterns are patent. Mild calcific atherosclerosis the carotid siphons.  Large the left frontal scalp hematoma without subcutaneous gas or radiopaque foreign bodies visualized ocular globes and orbital contents are unremarkable. No skull  fracture.  CT CERVICAL SPINE FINDINGS  Cervical vertebral bodies and posterior elements are intact. Straightened cervical lordosis. Mild broad levoscoliosis may be positional. Grade 1 C3-4 anterolisthesis on degenerative basis. The right C2-3 facets are fused, likely on degenerative basis with degenerative cystic changes. C1-2 articulation maintained, mild arthropathy. Severe C4-5 through C6-7 degenerative disc disease. No destructive bony lesions.  IMPRESSION: CT head: Moderately motion degraded examination. Large left frontal scalp hematoma without underlying skull fracture nor acute intracranial process.  Severe global brain atrophy. At least mild to moderate white matter changes likely reflect chronic small vessel ischemic disease.  CT cervical spine: Straightened cervical lordosis without acute fracture. Grade 1 C3-4 anterolisthesis on degenerative basis.   Electronically Signed   By: Awilda Metroourtnay  Bloomer   On: 02/02/2014 00:40   Ct Cervical Spine Wo Contrast  02/02/2014   CLINICAL  DATA:  Fall from wheelchair on to carpeted surface. Dementia. Arm contracture.  EXAM: CT HEAD WITHOUT CONTRAST  CT CERVICAL SPINE WITHOUT CONTRAST  TECHNIQUE: Multidetector CT imaging of the head and cervical spine was performed following the standard protocol without intravenous contrast. Multiplanar CT image reconstructions of the cervical spine were also generated.  COMPARISON:  CT of the head August 03, 2013  FINDINGS: CT HEAD FINDINGS  Moderately motion degraded examination required Re imaging.  Severe ventriculomegaly, likely on the basis of global parenchymal brain volume loss, unchanged. No intraparenchymal hemorrhage, mass effect or midline shift. No acute large vascular territory infarcts. At least mild to moderate white matter changes suggest chronic small vessel ischemic disease though motion limits evaluation.  No abnormal extra-axial fluid collections. Basal cisterns are patent. Mild calcific atherosclerosis the  carotid siphons.  Large the left frontal scalp hematoma without subcutaneous gas or radiopaque foreign bodies visualized ocular globes and orbital contents are unremarkable. No skull fracture.  CT CERVICAL SPINE FINDINGS  Cervical vertebral bodies and posterior elements are intact. Straightened cervical lordosis. Mild broad levoscoliosis may be positional. Grade 1 C3-4 anterolisthesis on degenerative basis. The right C2-3 facets are fused, likely on degenerative basis with degenerative cystic changes. C1-2 articulation maintained, mild arthropathy. Severe C4-5 through C6-7 degenerative disc disease. No destructive bony lesions.  IMPRESSION: CT head: Moderately motion degraded examination. Large left frontal scalp hematoma without underlying skull fracture nor acute intracranial process.  Severe global brain atrophy. At least mild to moderate white matter changes likely reflect chronic small vessel ischemic disease.  CT cervical spine: Straightened cervical lordosis without acute fracture. Grade 1 C3-4 anterolisthesis on degenerative basis.   Electronically Signed   By: Awilda Metroourtnay  Bloomer   On: 02/02/2014 00:40     EKG Interpretation None      MDM   Final diagnoses:  Fall, initial encounter  Hematoma  Abrasion    Patient with fall , hematoma and abrasion. tdap updated. Wound cleansed  And bandaged.  Imaging negative. Appears safe for discharge at this time.    Arthor CaptainAbigail Harris, PA-C 02/03/14 1150

## 2014-02-02 ENCOUNTER — Emergency Department (HOSPITAL_COMMUNITY)
Admission: EM | Admit: 2014-02-02 | Discharge: 2014-02-02 | Disposition: A | Discharge status: Home / Self Care | Payer: Medicare Other | Attending: Emergency Medicine | Admitting: Emergency Medicine

## 2014-02-02 ENCOUNTER — Encounter (HOSPITAL_COMMUNITY): Payer: Self-pay | Admitting: Emergency Medicine

## 2014-02-02 DIAGNOSIS — G309 Alzheimer's disease, unspecified: Secondary | ICD-10-CM | POA: Insufficient documentation

## 2014-02-02 DIAGNOSIS — Z880 Allergy status to penicillin: Secondary | ICD-10-CM | POA: Insufficient documentation

## 2014-02-02 DIAGNOSIS — T148XXA Other injury of unspecified body region, initial encounter: Secondary | ICD-10-CM

## 2014-02-02 DIAGNOSIS — E7800 Pure hypercholesterolemia, unspecified: Secondary | ICD-10-CM | POA: Insufficient documentation

## 2014-02-02 DIAGNOSIS — I10 Essential (primary) hypertension: Secondary | ICD-10-CM | POA: Insufficient documentation

## 2014-02-02 DIAGNOSIS — Z8673 Personal history of transient ischemic attack (TIA), and cerebral infarction without residual deficits: Secondary | ICD-10-CM | POA: Insufficient documentation

## 2014-02-02 DIAGNOSIS — W050XXA Fall from non-moving wheelchair, initial encounter: Secondary | ICD-10-CM | POA: Insufficient documentation

## 2014-02-02 DIAGNOSIS — W19XXXD Unspecified fall, subsequent encounter: Secondary | ICD-10-CM

## 2014-02-02 DIAGNOSIS — IMO0002 Reserved for concepts with insufficient information to code with codable children: Secondary | ICD-10-CM | POA: Insufficient documentation

## 2014-02-02 DIAGNOSIS — Y9210 Unspecified residential institution as the place of occurrence of the external cause: Secondary | ICD-10-CM | POA: Insufficient documentation

## 2014-02-02 DIAGNOSIS — J45909 Unspecified asthma, uncomplicated: Secondary | ICD-10-CM | POA: Insufficient documentation

## 2014-02-02 DIAGNOSIS — K219 Gastro-esophageal reflux disease without esophagitis: Secondary | ICD-10-CM | POA: Insufficient documentation

## 2014-02-02 DIAGNOSIS — S0010XA Contusion of unspecified eyelid and periocular area, initial encounter: Secondary | ICD-10-CM | POA: Insufficient documentation

## 2014-02-02 DIAGNOSIS — Z792 Long term (current) use of antibiotics: Secondary | ICD-10-CM | POA: Insufficient documentation

## 2014-02-02 DIAGNOSIS — Z8701 Personal history of pneumonia (recurrent): Secondary | ICD-10-CM | POA: Insufficient documentation

## 2014-02-02 DIAGNOSIS — Z79899 Other long term (current) drug therapy: Secondary | ICD-10-CM | POA: Insufficient documentation

## 2014-02-02 DIAGNOSIS — Y939 Activity, unspecified: Secondary | ICD-10-CM | POA: Insufficient documentation

## 2014-02-02 DIAGNOSIS — Z7982 Long term (current) use of aspirin: Secondary | ICD-10-CM | POA: Insufficient documentation

## 2014-02-02 DIAGNOSIS — F0280 Dementia in other diseases classified elsewhere without behavioral disturbance: Secondary | ICD-10-CM | POA: Insufficient documentation

## 2014-02-02 MED ORDER — TETANUS-DIPHTH-ACELL PERTUSSIS 5-2.5-18.5 LF-MCG/0.5 IM SUSP: 0.5000 mL | Freq: Once | INTRAMUSCULAR | Status: DC

## 2014-02-02 NOTE — ED Provider Notes (Signed)
Medical screening examination/treatment/procedure(s) were conducted as a shared visit with non-physician practitioner(s) and myself.  I personally evaluated the patient during the encounter.   EKG Interpretation None      Patient presents with increased swelling after a fall which occurred yesterday. Patient was seen yesterday, evaluated including head CT. No intracranial injury was noted. The patient is awake and alert, no focal neurologic symptoms. Increase swelling is secondary to bruising from the original injury. No need for repeat imaging.  Ashley Creasehristopher J. Pollina, MD 02/02/14 1253

## 2014-02-02 NOTE — ED Notes (Signed)
Pt given 2 warm blankets; awaiting [facility] for transport back to her facility

## 2014-02-02 NOTE — Discharge Instructions (Signed)
Fall Prevention in Hospitals °As a hospital patient, your condition and the treatments you receive can increase your risk for falls. Some additional risk factors for falls in a hospital include: °· Being in an unfamiliar environment. °· Being on bed rest. °· Your surgery. °· Taking certain medicines. °· Your tubing requirements, such as intravenous (IV) therapy or catheters. °It is important that you learn how to decrease fall risks while at the hospital. Below are important tips that can help prevent falls. °SAFETY TIPS FOR PREVENTING FALLS °Talk about your risk of falling. °· Ask your caregiver why you are at risk for falling. Is it your medicine, illness, tubing placement, or something else? °· Make a plan with your caregiver to keep you safe from falls. °· Ask your caregiver or pharmacist about side effect of your medicines. Some medicines can make you dizzy or affect your coordination. °Ask for help. °· Ask for help before getting out of bed. You may need to press your call button. °· Ask for assistance in getting you safely to the toilet. °· Ask for a walker or cane to be put at your bedside. Ask that most of the side rails on your bed be placed up before your caregiver leaves the room. °· Ask family or friends to sit with you. °· Ask for things that are out of your reach, such as your glasses, hearing aids, telephone, bedside table, or call button. °Follow these tips to avoid falling: °· Stay lying or seated, rather than standing, while waiting for help. °· Wear rubber-soled slippers or shoes whenever you walk in the hospital. °· Avoid quick, sudden movements. °¨ Change positions slowly. °¨ Sit on the side of your bed before standing. °¨ Stand up slowly and wait before you start to walk. °· Let your caregiver know if there is a spill on the floor. °· Pay careful attention to the medical equipment, electrical cords, and tubes around you. °· When you need help, use your call button by your bed or in the  bathroom. Wait for one of your caregivers to help you. °· If you feel dizzy or unsure of your footing, return to bed and wait for assistance. °· Avoid being distracted by the TV, telephone, or another person in your room. °· Do not lean or support yourself on rolling objects, such as IV poles or bedside tables. °Document Released: 07/26/2000 Document Revised: 07/15/2012 Document Reviewed: 04/05/2012 °[employer]® Patient Information ©2015 [employer]. This information is not intended to replace advice given to you by your health care provider. Make sure you discuss any questions you have with your health care provider. ° °

## 2014-02-02 NOTE — ED Provider Notes (Signed)
CSN: [id number]     Arrival date & time 02/02/14  1137 History   First MD Initiated Contact with Patient 02/02/14 1139     Chief Complaint  Patient presents with  . Fall     (Consider location/radiation/quality/duration/timing/severity/associated sxs/prior Treatment) Patient is a 80 y.o. female presenting with fall. The history is provided by medical records and the EMS personnel. No language interpreter was used.  Fall    [name] is a 80 y.o. female  with a hx of Alzheimer's, hypertension, GERD, stroke presents to the Emergency Department after fall at the skilled nursing facility.  Level V caveat for Alzheimer's dementia.  Per EMS patient fell out of her wheelchair last night striking her head on the carpet.  She was transported here to [facility] and evaluated. After a negative head CT she was discharged home.  EMS reports skilled nursing facility called EMS for retransport today because the hematoma above her left eye has increased.  Nursing facility reports that patient is at baseline mental status.      Past Medical History  Diagnosis Date  . Alzheimer disease   . Dementia   . Hypertension   . Hypercholesteremia   . GERD (gastroesophageal reflux disease)   . Hyponatremia   . Asthma   . Borderline diabetes   . Seizures     "one 5 years ago; none since as far as I know" (08/03/2013)  . Stroke     "said she had a mini stroke" (08/03/2013)  . Pneumonia 10/2012   Past Surgical History  Procedure Laterality Date  . Cataract extraction    . Total knee arthroplasty Bilateral   . Cataract extraction w/ intraocular lens  implant, bilateral Bilateral    History reviewed. No pertinent family history. History  Substance Use Topics  . Smoking status: Never Smoker   . Smokeless tobacco: Never Used  . Alcohol Use: No   OB History   Grav Para Term Preterm Abortions TAB SAB Ect Mult Living                 Review of Systems  Unable to perform ROS     Allergies   Penicillins  Home Medications   Prior to Admission medications   Medication Sig Start Date End Date Taking? Authorizing Provider  albuterol (PROVENTIL HFA;VENTOLIN HFA) 108 (90 BASE) MCG/ACT inhaler Inhale 2 puffs into the lungs 2 (two) times daily.      Historical Provider, MD  albuterol (PROVENTIL HFA;VENTOLIN HFA) 108 (90 BASE) MCG/ACT inhaler Inhale 2 puffs into the lungs every 4 (four) hours as needed for wheezing or shortness of breath.    Historical Provider, MD  amLODipine (NORVASC) 5 MG tablet Take 1 tablet (5 mg total) by mouth daily. 11/05/12   Renae Fickle, MD  aspirin EC 81 MG tablet Take 81 mg by mouth every other day.    Historical Provider, MD  cholecalciferol (VITAMIN D) 1000 UNITS tablet Take 1,000 Units by mouth daily.      Historical Provider, MD  divalproex (DEPAKOTE SPRINKLE) 125 MG capsule Take 125 mg by mouth 2 (two) times daily.    Historical Provider, MD  docusate sodium (COLACE) 100 MG capsule Take 100 mg by mouth 2 (two) times daily.      Historical Provider, MD  donepezil (ARICEPT) 5 MG tablet Take 5 mg by mouth at bedtime.    Historical Provider, MD  ferrous sulfate 325 (65 FE) MG tablet Take 325 mg by mouth 2 (two) times  daily.     Historical Provider, MD  folic acid (FOLVITE) 1 MG tablet Take 1 mg by mouth every other day.      Historical Provider, MD  losartan (COZAAR) 100 MG tablet Take 100 mg by mouth daily.      Historical Provider, MD  metoprolol (LOPRESSOR) 50 MG tablet Take 0.5 tablets (25 mg total) by mouth 2 (two) times daily. 08/06/13   Clydia LlanoMutaz Elmahi, MD  montelukast (SINGULAIR) 10 MG tablet Take 10 mg by mouth at bedtime.      Historical Provider, MD  Multiple Vitamin (MULTIVITAMIN WITH MINERALS) TABS tablet Take 1 tablet by mouth daily.    Historical Provider, MD  pantoprazole (PROTONIX) 40 MG tablet Take 40 mg by mouth daily.      Historical Provider, MD  rosuvastatin (CRESTOR) 10 MG tablet Take 10 mg by mouth daily.      Historical Provider, MD   sulfamethoxazole-trimethoprim (BACTRIM DS,SEPTRA DS) 800-160 MG per tablet Take 1 tablet by mouth 2 (two) times daily.    Historical Provider, MD   BP 143/63  Pulse 53  Resp 15  SpO2 92% Physical Exam  Nursing note and vitals reviewed. Constitutional: She is oriented to person, place, and time. She appears well-developed and well-nourished. No distress.  HENT:  Head: Normocephalic.  Nose: Nose normal.  Mouth/Throat: Oropharynx is clear and moist.  Abrasion to the left forehead Swelling noted to the left upper eyelid - appears gravitational in nature from the left for head hematoma  Eyes: Conjunctivae and EOM are normal. Pupils are equal, round, and reactive to light. No scleral icterus.  No horizontal, vertical or rotational nystagmus Pupils equal round reactive to light No subconjunctival hematoma  Neck: Normal range of motion. Neck supple.  Full active and passive ROM without pain No midline or paraspinal tenderness No nuchal rigidity or meningeal signs  Cardiovascular: Normal rate, regular rhythm, normal heart sounds and intact distal pulses.   No murmur heard. Pulmonary/Chest: Effort normal and breath sounds normal. No respiratory distress. She has no wheezes. She has no rales.  Clear and equal breath sounds  Abdominal: Soft. Bowel sounds are normal. There is no tenderness. There is no rebound and no guarding.  Abdomen soft and nontender  Musculoskeletal: Normal range of motion.  No evidence of deformity to any extremities  Lymphadenopathy:    She has no cervical adenopathy.  Neurological: She is alert and oriented to person, place, and time. She has normal reflexes. No cranial nerve deficit. She exhibits normal muscle tone. Coordination normal.  Mental Status:  Alert Cranial Nerves:  II:  pupils equal, round, reactive to light III,IV, VI: ptosis not present, extra-ocular motions intact bilaterally  V,VII: smile symmetric,  XII: midline tongue extension  Motor:   Bilateral upper extremities contracted but equal grip strength No ambulation as pt is wheelchair bound at baseline CV: distal pulses palpable throughout   Skin: Skin is warm and dry. No rash noted. She is not diaphoretic.  Psychiatric: She has a normal mood and affect. Her behavior is normal. Judgment and thought content normal.    ED Course  Procedures (including critical care time) Labs Review Labs Reviewed - No data to display  Imaging Review Ct Head Wo Contrast  02/02/2014   CLINICAL DATA:  Fall from wheelchair on to carpeted surface. Dementia. Arm contracture.  EXAM: CT HEAD WITHOUT CONTRAST  CT CERVICAL SPINE WITHOUT CONTRAST  TECHNIQUE: Multidetector CT imaging of the head and cervical spine was performed following the standard  protocol without intravenous contrast. Multiplanar CT image reconstructions of the cervical spine were also generated.  COMPARISON:  CT of the head August 03, 2013  FINDINGS: CT HEAD FINDINGS  Moderately motion degraded examination required Re imaging.  Severe ventriculomegaly, likely on the basis of global parenchymal brain volume loss, unchanged. No intraparenchymal hemorrhage, mass effect or midline shift. No acute large vascular territory infarcts. At least mild to moderate white matter changes suggest chronic small vessel ischemic disease though motion limits evaluation.  No abnormal extra-axial fluid collections. Basal cisterns are patent. Mild calcific atherosclerosis the carotid siphons.  Large the left frontal scalp hematoma without subcutaneous gas or radiopaque foreign bodies visualized ocular globes and orbital contents are unremarkable. No skull fracture.  CT CERVICAL SPINE FINDINGS  Cervical vertebral bodies and posterior elements are intact. Straightened cervical lordosis. Mild broad levoscoliosis may be positional. Grade 1 C3-4 anterolisthesis on degenerative basis. The right C2-3 facets are fused, likely on degenerative basis with degenerative cystic  changes. C1-2 articulation maintained, mild arthropathy. Severe C4-5 through C6-7 degenerative disc disease. No destructive bony lesions.  IMPRESSION: CT head: Moderately motion degraded examination. Large left frontal scalp hematoma without underlying skull fracture nor acute intracranial process.  Severe global brain atrophy. At least mild to moderate white matter changes likely reflect chronic small vessel ischemic disease.  CT cervical spine: Straightened cervical lordosis without acute fracture. Grade 1 C3-4 anterolisthesis on degenerative basis.   Electronically Signed   By: Awilda Metro   On: 02/02/2014 00:40   Ct Cervical Spine Wo Contrast  02/02/2014   CLINICAL DATA:  Fall from wheelchair on to carpeted surface. Dementia. Arm contracture.  EXAM: CT HEAD WITHOUT CONTRAST  CT CERVICAL SPINE WITHOUT CONTRAST  TECHNIQUE: Multidetector CT imaging of the head and cervical spine was performed following the standard protocol without intravenous contrast. Multiplanar CT image reconstructions of the cervical spine were also generated.  COMPARISON:  CT of the head August 03, 2013  FINDINGS: CT HEAD FINDINGS  Moderately motion degraded examination required Re imaging.  Severe ventriculomegaly, likely on the basis of global parenchymal brain volume loss, unchanged. No intraparenchymal hemorrhage, mass effect or midline shift. No acute large vascular territory infarcts. At least mild to moderate white matter changes suggest chronic small vessel ischemic disease though motion limits evaluation.  No abnormal extra-axial fluid collections. Basal cisterns are patent. Mild calcific atherosclerosis the carotid siphons.  Large the left frontal scalp hematoma without subcutaneous gas or radiopaque foreign bodies visualized ocular globes and orbital contents are unremarkable. No skull fracture.  CT CERVICAL SPINE FINDINGS  Cervical vertebral bodies and posterior elements are intact. Straightened cervical lordosis. Mild  broad levoscoliosis may be positional. Grade 1 C3-4 anterolisthesis on degenerative basis. The right C2-3 facets are fused, likely on degenerative basis with degenerative cystic changes. C1-2 articulation maintained, mild arthropathy. Severe C4-5 through C6-7 degenerative disc disease. No destructive bony lesions.  IMPRESSION: CT head: Moderately motion degraded examination. Large left frontal scalp hematoma without underlying skull fracture nor acute intracranial process.  Severe global brain atrophy. At least mild to moderate white matter changes likely reflect chronic small vessel ischemic disease.  CT cervical spine: Straightened cervical lordosis without acute fracture. Grade 1 C3-4 anterolisthesis on degenerative basis.   Electronically Signed   By: Awilda Metro   On: 02/02/2014 00:40     EKG Interpretation None      MDM   Final diagnoses:  Hematoma  Fall, subsequent encounter   [name] presents via EMS  for recheck of her hematoma. Patient fell yesterday while in the wheelchair striking the left side of her face on a carpeted ground. I reviewed the CT head and neck scan from yesterday showing no acute abnormality.  Skilled nursing facility reports that hematoma is getting bigger.  Patient likely with increased swelling to the left upper lid secondary to gravitational forces from the hematoma on her left fore head. Patient appears to be at baseline mentation, pupils are equal round reactive to light and fracture ocular movements are intact.  No subconjunctival hematomas.  No evidence of new trauma or alteration of baseline. At this time and do not believe the patient needs a new CT scan. Recommend ice for the hematoma and reassessment by primary care in 2 days.  I have personally reviewed patient's vitals, nursing note and any pertinent labs or imaging.  I performed an undressed physical exam.    At this time, it has been determined that no acute conditions requiring further  emergency intervention. The patient/guardian have been advised of the diagnosis and plan. I reviewed all labs and imaging including any potential incidental findings. We have discussed signs and symptoms that warrant return to the ED, such as altered level of consciousness.  Patient/guardian has voiced understanding and agreed to follow-up with the PCP or specialist in 2 days.  Vital signs are stable at discharge.   BP 143/63  Pulse 53  Resp 15  SpO2 92%        Ashley ForthHannah Bonny Vanleeuwen, PA-C 02/02/14 1241

## 2014-02-02 NOTE — ED Notes (Signed)
Pt back from CT

## 2014-02-02 NOTE — ED Notes (Signed)
[facility] from [facility]

## 2014-02-02 NOTE — ED Notes (Signed)
Hannah, PA at the bedside.  

## 2014-02-02 NOTE — ED Notes (Signed)
Per EMS, pt sent here for further eval of hematoma from fall yesterday. sts was seen here last night. sts no change in mental status but hematoma has gotten bigger. Pt alert. VS stable

## 2014-02-02 NOTE — Discharge Instructions (Signed)
1. Medications: usual home medications 2. Treatment: rest, drink plenty of fluids, apply ice to the forehead hematoma, keep abrasion covered with triple antibiotic ointment 3. Follow Up: Please followup with your primary doctor in 2 days for discussion of your diagnoses and further evaluation after today's visit;    Hematoma A hematoma is a collection of blood under the skin, in an organ, in a body space, in a joint space, or in other tissue. The blood can clot to form a lump that you can see and feel. The lump is often firm and may sometimes become sore and tender. Most hematomas get better in a few days to weeks. However, some hematomas may be serious and require medical care. Hematomas can range in size from very small to very large. CAUSES  A hematoma can be caused by a blunt or penetrating injury. It can also be caused by spontaneous leakage from a blood vessel under the skin. Spontaneous leakage from a blood vessel is more likely to occur in older people, especially those taking blood thinners. Sometimes, a hematoma can develop after certain medical procedures. SIGNS AND SYMPTOMS   A firm lump on the body.  Possible pain and tenderness in the area.  Bruising.Blue, dark blue, purple-red, or yellowish skin may appear at the site of the hematoma if the hematoma is close to the surface of the skin. For hematomas in deeper tissues or body spaces, the signs and symptoms may be subtle. For example, an intra-abdominal hematoma may cause abdominal pain, weakness, fainting, and shortness of breath. An intracranial hematoma may cause a headache or symptoms such as weakness, trouble speaking, or a change in consciousness. DIAGNOSIS  A hematoma can usually be diagnosed based on your medical history and a physical exam. Imaging tests may be needed if your health care provider suspects a hematoma in deeper tissues or body spaces, such as the abdomen, head, or chest. These tests may include ultrasonography  or a CT scan.  TREATMENT  Hematomas usually go away on their own over time. Rarely does the blood need to be drained out of the body. Large hematomas or those that may affect vital organs will sometimes need surgical drainage or monitoring. HOME CARE INSTRUCTIONS   Apply ice to the injured area:   Put ice in a plastic bag.   Place a towel between your skin and the bag.   Leave the ice on for 20 minutes, 2-3 times a day for the first 1 to 2 days.   After the first 2 days, switch to using warm compresses on the hematoma.   Elevate the injured area to help decrease pain and swelling. Wrapping the area with an elastic bandage may also be helpful. Compression helps to reduce swelling and promotes shrinking of the hematoma. Make sure the bandage is not wrapped too tight.   If your hematoma is on a lower extremity and is painful, crutches may be helpful for a couple days.   Only take over-the-counter or prescription medicines as directed by your health care provider. SEEK IMMEDIATE MEDICAL CARE IF:   You have increasing pain, or your pain is not controlled with medicine.   You have a fever.   You have worsening swelling or discoloration.   Your skin over the hematoma breaks or starts bleeding.   Your hematoma is in your chest or abdomen and you have weakness, shortness of breath, or a change in consciousness.  Your hematoma is on your scalp (caused by a fall or  injury) and you have a worsening headache or a change in alertness or consciousness. MAKE SURE YOU:   Understand these instructions.  Will watch your condition.  Will get help right away if you are not doing well or get worse. Document Released: 03/12/2004 Document Revised: 03/31/2013 Document Reviewed: 01/06/2013 [employer] Patient Information 2015 [employer] [place]. This information is not intended to replace advice given to you by your health care provider. Make sure you discuss any questions you have with your  health care provider.

## 2014-02-06 NOTE — ED Provider Notes (Signed)
Medical screening examination/treatment/procedure(s) were performed by non-physician practitioner and as supervising physician I was immediately available for consultation/collaboration.   EKG Interpretation None        Candyce ChurnJohn David Wofford III, MD 02/06/14 [phone]

## 2014-05-26 ENCOUNTER — Emergency Department (HOSPITAL_COMMUNITY)

## 2014-05-26 ENCOUNTER — Emergency Department (HOSPITAL_COMMUNITY)
Admission: EM | Admit: 2014-05-26 | Discharge: 2014-05-26 | Disposition: A | Discharge status: Home / Self Care | Attending: Emergency Medicine | Admitting: Emergency Medicine

## 2014-05-26 ENCOUNTER — Encounter (HOSPITAL_COMMUNITY): Payer: Self-pay | Admitting: Emergency Medicine

## 2014-05-26 DIAGNOSIS — S42362A Displaced segmental fracture of shaft of humerus, left arm, initial encounter for closed fracture: Secondary | ICD-10-CM | POA: Diagnosis not present

## 2014-05-26 DIAGNOSIS — E780 Pure hypercholesterolemia: Secondary | ICD-10-CM | POA: Diagnosis not present

## 2014-05-26 DIAGNOSIS — K219 Gastro-esophageal reflux disease without esophagitis: Secondary | ICD-10-CM | POA: Diagnosis not present

## 2014-05-26 DIAGNOSIS — W1809XA Striking against other object with subsequent fall, initial encounter: Secondary | ICD-10-CM | POA: Insufficient documentation

## 2014-05-26 DIAGNOSIS — F0280 Dementia in other diseases classified elsewhere without behavioral disturbance: Secondary | ICD-10-CM | POA: Diagnosis not present

## 2014-05-26 DIAGNOSIS — Z8673 Personal history of transient ischemic attack (TIA), and cerebral infarction without residual deficits: Secondary | ICD-10-CM | POA: Diagnosis not present

## 2014-05-26 DIAGNOSIS — IMO0002 Reserved for concepts with insufficient information to code with codable children: Secondary | ICD-10-CM

## 2014-05-26 DIAGNOSIS — S01511A Laceration without foreign body of lip, initial encounter: Secondary | ICD-10-CM | POA: Insufficient documentation

## 2014-05-26 DIAGNOSIS — G40909 Epilepsy, unspecified, not intractable, without status epilepticus: Secondary | ICD-10-CM | POA: Diagnosis not present

## 2014-05-26 DIAGNOSIS — J45909 Unspecified asthma, uncomplicated: Secondary | ICD-10-CM | POA: Insufficient documentation

## 2014-05-26 DIAGNOSIS — Z880 Allergy status to penicillin: Secondary | ICD-10-CM | POA: Diagnosis not present

## 2014-05-26 DIAGNOSIS — Y939 Activity, unspecified: Secondary | ICD-10-CM | POA: Insufficient documentation

## 2014-05-26 DIAGNOSIS — Z79899 Other long term (current) drug therapy: Secondary | ICD-10-CM | POA: Diagnosis not present

## 2014-05-26 DIAGNOSIS — Z7982 Long term (current) use of aspirin: Secondary | ICD-10-CM | POA: Diagnosis not present

## 2014-05-26 DIAGNOSIS — I10 Essential (primary) hypertension: Secondary | ICD-10-CM | POA: Insufficient documentation

## 2014-05-26 DIAGNOSIS — Z8701 Personal history of pneumonia (recurrent): Secondary | ICD-10-CM | POA: Diagnosis not present

## 2014-05-26 DIAGNOSIS — S42302A Unspecified fracture of shaft of humerus, left arm, initial encounter for closed fracture: Secondary | ICD-10-CM

## 2014-05-26 DIAGNOSIS — Y9289 Other specified places as the place of occurrence of the external cause: Secondary | ICD-10-CM | POA: Insufficient documentation

## 2014-05-26 DIAGNOSIS — G309 Alzheimer's disease, unspecified: Secondary | ICD-10-CM | POA: Insufficient documentation

## 2014-05-26 MED ORDER — LIDOCAINE HCL 2 % IJ SOLN: 5.0000 mL | Freq: Once | INTRAMUSCULAR | Status: DC

## 2014-05-26 MED ORDER — LIDOCAINE HCL 1 % IJ SOLN
INTRAMUSCULAR | Status: AC
  Administered 2014-05-26: 5 mL
  Filled 2014-05-26: qty 20

## 2014-05-26 MED ORDER — HYDROCODONE-ACETAMINOPHEN 5-325 MG PO TABS: 0.5000 | ORAL_TABLET | ORAL | Status: DC | PRN

## 2014-05-26 NOTE — ED Notes (Signed)
Bed: [id number] Expected date:  Expected time:  Means of arrival:  Comments: EMS- lip lac, hit face on wheel of wheelchair, no thinners

## 2014-05-26 NOTE — ED Notes (Signed)
Ortho tech called and made aware of orders

## 2014-05-26 NOTE — ED Notes (Signed)
Patient left room again for radiology

## 2014-05-26 NOTE — ED Notes (Signed)
Attempted to call [facility]--initially disconnected and now number is ringing busy

## 2014-05-26 NOTE — Discharge Instructions (Signed)
Suture should be removed in 7 days. Watch for signs of infection. Will need to follow up with orthopedics to follow Humerus Fracture.  Humerus Fracture, Treated with Immobilization The humerus is the large bone in your upper arm. You have a broken (fractured) humerus. These fractures are easily diagnosed with X-rays. TREATMENT  Simple fractures which will heal without disability are treated with simple immobilization. Immobilization means you will wear a cast, splint, or sling. You have a fracture which will do well with immobilization. The fracture will heal well simply by being held in a good position until it is stable enough to begin range of motion exercises. Do not take part in activities which would further injure your arm.  HOME CARE INSTRUCTIONS   Put ice on the injured area.  Put ice in a plastic bag.  Place a towel between your skin and the bag.  Leave the ice on for 15-20 minutes, 03-04 times a day.  If you have a cast:  Do not scratch the skin under the cast using sharp or pointed objects.  Check the skin around the cast every day. You may put lotion on any red or sore areas.  Keep your cast dry and clean.  If you have a splint:  Wear the splint as directed.  Keep your splint dry and clean.  You may loosen the elastic around the splint if your fingers become numb, tingle, or turn cold or blue.  If you have a sling:  Wear the sling as directed.  Do not put pressure on any part of your cast or splint until it is fully hardened.  Your cast or splint can be protected during bathing with a plastic bag. Do not lower the cast or splint into water.  Only take over-the-counter or prescription medicines for pain, discomfort, or fever as directed by your caregiver.  Do range of motion exercises as instructed by your caregiver.  Follow up as directed by your caregiver. This is very important in order to avoid permanent injury or disability and chronic pain. SEEK  IMMEDIATE MEDICAL CARE IF:   Your skin or nails in the injured arm turn blue or gray.  Your arm feels cold or numb.  You develop severe pain in the injured arm.  You are having problems with the medicines you were given. MAKE SURE YOU:   Understand these instructions.  Will watch your condition.  Will get help right away if you are not doing well or get worse. Document Released: 11/04/2000 Document Revised: 10/21/2011 Document Reviewed: 09/12/2010 [facility] Patient Information 2015 [place], [place]. This information is not intended to replace advice given to you by your health care provider. Make sure you discuss any questions you have with your health care provider.  Laceration Care, Adult A laceration is a cut or lesion that goes through all layers of the skin and into the tissue just beneath the skin. TREATMENT  Some lacerations may not require closure. Some lacerations may not be able to be closed due to an increased risk of infection. It is important to see your caregiver as soon as possible after an injury to minimize the risk of infection and maximize the opportunity for successful closure. If closure is appropriate, pain medicines may be given, if needed. The wound will be cleaned to help prevent infection. Your caregiver will use stitches (sutures), staples, wound glue (adhesive), or skin adhesive strips to repair the laceration. These tools bring the skin edges together to allow for faster healing and  a better cosmetic outcome. However, all wounds will heal with a scar. Once the wound has healed, scarring can be minimized by covering the wound with sunscreen during the day for 1 full year. HOME CARE INSTRUCTIONS  For sutures or staples:  Keep the wound clean and dry.  If you were given a bandage (dressing), you should change it at least once a day. Also, change the dressing if it becomes wet or dirty, or as directed by your caregiver.  Wash the wound with soap and water 2 times  a day. Rinse the wound off with water to remove all soap. Pat the wound dry with a clean towel.  After cleaning, apply a thin layer of the antibiotic ointment as recommended by your caregiver. This will help prevent infection and keep the dressing from sticking.  You may shower as usual after the first 24 hours. Do not soak the wound in water until the sutures are removed.  Only take over-the-counter or prescription medicines for pain, discomfort, or fever as directed by your caregiver.  Get your sutures or staples removed as directed by your caregiver. For skin adhesive strips:  Keep the wound clean and dry.  Do not get the skin adhesive strips wet. You may bathe carefully, using caution to keep the wound dry.  If the wound gets wet, pat it dry with a clean towel.  Skin adhesive strips will fall off on their own. You may trim the strips as the wound heals. Do not remove skin adhesive strips that are still stuck to the wound. They will fall off in time. For wound adhesive:  You may briefly wet your wound in the shower or bath. Do not soak or scrub the wound. Do not swim. Avoid periods of heavy perspiration until the skin adhesive has fallen off on its own. After showering or bathing, gently pat the wound dry with a clean towel.  Do not apply liquid medicine, cream medicine, or ointment medicine to your wound while the skin adhesive is in place. This may loosen the film before your wound is healed.  If a dressing is placed over the wound, be careful not to apply tape directly over the skin adhesive. This may cause the adhesive to be pulled off before the wound is healed.  Avoid prolonged exposure to sunlight or tanning lamps while the skin adhesive is in place. Exposure to ultraviolet light in the first year will darken the scar.  The skin adhesive will usually remain in place for 5 to 10 days, then naturally fall off the skin. Do not pick at the adhesive film. You may need a tetanus shot  if:  You cannot remember when you had your last tetanus shot.  You have never had a tetanus shot. If you get a tetanus shot, your arm may swell, get red, and feel warm to the touch. This is common and not a problem. If you need a tetanus shot and you choose not to have one, there is a rare chance of getting tetanus. Sickness from tetanus can be serious. SEEK MEDICAL CARE IF:   You have redness, swelling, or increasing pain in the wound.  You see a red line that goes away from the wound.  You have yellowish-white fluid (pus) coming from the wound.  You have a fever.  You notice a bad smell coming from the wound or dressing.  Your wound breaks open before or after sutures have been removed.  You notice something coming out of  the wound such as wood or glass.  Your wound is on your hand or foot and you cannot move a finger or toe. SEEK IMMEDIATE MEDICAL CARE IF:   Your pain is not controlled with prescribed medicine.  You have severe swelling around the wound causing pain and numbness or a change in color in your arm, hand, leg, or foot.  Your wound splits open and starts bleeding.  You have worsening numbness, weakness, or loss of function of any joint around or beyond the wound.  You develop painful lumps near the wound or on the skin anywhere on your body. MAKE SURE YOU:   Understand these instructions.  Will watch your condition.  Will get help right away if you are not doing well or get worse. Document Released: 07/29/2005 Document Revised: 10/21/2011 Document Reviewed: 01/22/2011 [facility] Patient Information 2015 [place]. This information is not intended to replace advice given to you by your health care provider. Make sure you discuss any questions you have with your health care provider.

## 2014-05-26 NOTE — ED Notes (Signed)
[facility] due to fall and lip laceration Patient was noted to be sliding out of her wheelchair and was assisted to the floor by nursing home staff Per EMS, while patient was being assisted to the floor, patient hit her face/lip on wheelchair--patient did not hit her head Small laceration noted to left lower lip--minimal bleeding noted Patient with hx of dementia and Alzheimer's disease

## 2014-05-26 NOTE — ED Notes (Signed)
Patient taken to radiology for additional testing

## 2014-05-26 NOTE — ED Notes (Signed)
Patient transported to X-ray via stretcher Patient in NAD upon leaving room for testing  

## 2014-05-26 NOTE — Progress Notes (Signed)
ED RN visit-[facility] ([facility]) Francesco RunnerK robertson [facility] after a fall from her wheel chair. [facility] notified after transport. [facility] with hospice dx of Alzheimer's dz. Out of facility DNR noted in pt's stand up file at ED desk.  Pt seen in the ED, lying on the stretcher, head tucked in a blanket. Pt responded quickly to voice, eyes opened, able to recall/verbalize where she lived, unable to verbalize what brought her to the ED. Pt denied pain or discomfort, smiled easily when spoken to, offered to writer "you ask too many questions". Writer repositioned patient during visit, no verbal or nonverbal s/s of pain noted. Per chart review and ED RN Irving Burtonmily pt did have a stitch placed to lip laceration. Also of note pt found to have a broken left humeral head. Per discussion with ED physician, Dr. Blinda LeatherwoodPollina, fracture was found during his examination and confirmed by xray. Pt did not c/o pain even with manipulation of her arm. Plan is for [facility] L arm sling, pending results of further CT and xrays ordered. Pt resting quietly at end of visit. No family present during visit. Writer [facility] RN Sherlyn Lickherese Bacon notified of patient's current condition and plans for d/c via phone call. Please conatct [facility]) @ [phone] with any hospice needs. Thank you. Dayna BarkerKaren Robertson RN, BSN, [facility]) [facility] [phone] c

## 2014-05-26 NOTE — ED Notes (Signed)
[facility] called and made aware that patient is returning

## 2014-05-26 NOTE — ED Notes (Signed)
[facility] made aware that patient has been DC back to nursing home

## 2014-05-26 NOTE — ED Notes (Signed)
Patient back from radiology.

## 2014-05-26 NOTE — ED Notes (Signed)
MD at bedside. 

## 2014-05-26 NOTE — ED Provider Notes (Signed)
CSN: [id number]     Arrival date & time 05/26/14  [id number] History   First MD Initiated Contact with Patient 05/26/14 0848     Chief Complaint  Patient presents with  . Fall  . Lip Laceration     (Consider location/radiation/quality/duration/timing/severity/associated sxs/prior Treatment) HPI Comments: Patient presents to the ER for evaluation of a lip laceration. Patient reportedly started to slide out of her wheelchair and was caught by staff, but she was helped to the ground she hit her lip on the wheelchair, suffering laceration. Patient has a history of dementia, baseline disorientation. Level V Caveat due to dementia.  Patient is a 80 y.o. female presenting with fall.  Fall    Past Medical History  Diagnosis Date  . Alzheimer disease   . Dementia   . Hypertension   . Hypercholesteremia   . GERD (gastroesophageal reflux disease)   . Hyponatremia   . Asthma   . Borderline diabetes   . Seizures     "one 5 years ago; none since as far as I know" (08/03/2013)  . Stroke     "said she had a mini stroke" (08/03/2013)  . Pneumonia 10/2012   Past Surgical History  Procedure Laterality Date  . Cataract extraction    . Total knee arthroplasty Bilateral   . Cataract extraction w/ intraocular lens  implant, bilateral Bilateral    History reviewed. No pertinent family history. History  Substance Use Topics  . Smoking status: Never Smoker   . Smokeless tobacco: Never Used  . Alcohol Use: No   OB History   Grav Para Term Preterm Abortions TAB SAB Ect Mult Living                 Review of Systems  Unable to perform ROS: Dementia      Allergies  Penicillins  Home Medications   Prior to Admission medications   Medication Sig Start Date End Date Taking? Authorizing Provider  albuterol (PROVENTIL HFA;VENTOLIN HFA) 108 (90 BASE) MCG/ACT inhaler Inhale 2 puffs into the lungs 2 (two) times daily as needed for wheezing or shortness of breath. May use every 4 hours if needed    Yes Historical Provider, MD  amLODipine (NORVASC) 5 MG tablet Take 1 tablet (5 mg total) by mouth daily. 11/05/12  Yes Renae FickleMackenzie Short, MD  aspirin EC 81 MG tablet Take 81 mg by mouth every other day.   Yes Historical Provider, MD  cholecalciferol (VITAMIN D) 1000 UNITS tablet Take 1,000 Units by mouth daily.     Yes Historical Provider, MD  divalproex (DEPAKOTE SPRINKLE) 125 MG capsule Take 250 mg by mouth 2 (two) times daily.    Yes Historical Provider, MD  docusate sodium (COLACE) 100 MG capsule Take 200 mg by mouth 2 (two) times daily.    Yes Historical Provider, MD  donepezil (ARICEPT) 5 MG tablet Take 5 mg by mouth at bedtime.   Yes Historical Provider, MD  ferrous sulfate 325 (65 FE) MG tablet Take 325 mg by mouth 2 (two) times daily.    Yes Historical Provider, MD  folic acid (FOLVITE) 1 MG tablet Take 1 mg by mouth every other day.     Yes Historical Provider, MD  LORazepam (ATIVAN) 0.5 MG tablet Take 0.5 mg by mouth 2 (two) times daily as needed for anxiety (and agitation). May take 1 tablet every 4 hours if needed   Yes Historical Provider, MD  losartan (COZAAR) 100 MG tablet Take 100 mg by mouth daily.  Yes Historical Provider, MD  metoprolol (LOPRESSOR) 50 MG tablet Take 0.5 tablets (25 mg total) by mouth 2 (two) times daily. 08/06/13  Yes Clydia Llano, MD  montelukast (SINGULAIR) 10 MG tablet Take 10 mg by mouth at bedtime.     Yes Historical Provider, MD  Multiple Vitamin (MULTIVITAMIN WITH MINERALS) TABS tablet Take 1 tablet by mouth daily.   Yes Historical Provider, MD  pantoprazole (PROTONIX) 40 MG tablet Take 40 mg by mouth daily.     Yes Historical Provider, MD  rosuvastatin (CRESTOR) 10 MG tablet Take 10 mg by mouth daily.     Yes Historical Provider, MD   BP 173/60  Pulse 89  Temp(Src) 98 F (36.7 C) (Axillary)  SpO2 100% Physical Exam  Constitutional: She is oriented to person, place, and time. She appears well-developed and well-nourished. No distress.  HENT:  Head:  Normocephalic and atraumatic.    Right Ear: Hearing normal.  Left Ear: Hearing normal.  Nose: Nose normal.  Mouth/Throat: Oropharynx is clear and moist and mucous membranes are normal.  Eyes: Conjunctivae and EOM are normal. Pupils are equal, round, and reactive to light.  Neck: Normal range of motion. Neck supple.  Cardiovascular: Regular rhythm, S1 normal and S2 normal.  Exam reveals no gallop and no friction rub.   No murmur heard. Pulmonary/Chest: Effort normal and breath sounds normal. No respiratory distress. She exhibits no tenderness.  Abdominal: Soft. Normal appearance and bowel sounds are normal. There is no hepatosplenomegaly. There is no tenderness. There is no rebound, no guarding, no tenderness at McBurney's point and negative Murphy's sign. No hernia.  Musculoskeletal:       Left shoulder: She exhibits decreased range of motion.  Neurological: She is alert and oriented to person, place, and time. She has normal strength. No cranial nerve deficit or sensory deficit. Coordination normal. GCS eye subscore is 4. GCS verbal subscore is 5. GCS motor subscore is 6.  Skin: Skin is warm, dry and intact. No rash noted. No cyanosis.  Psychiatric: She has a normal mood and affect. Her speech is normal and behavior is normal. Thought content normal.    ED Course  Procedures (including critical care time)  LACERATION REPAIR Performed by: Gilda Crease. Authorized by: Gilda Crease Consent: Verbal consent obtained. Risks and benefits: risks, benefits and alternatives were discussed Consent given by: patient Patient identity confirmed: provided demographic data Prepped and Draped in normal sterile fashion Wound explored  Laceration Location: face  Laceration Length: 1cm  No Foreign Bodies seen or palpated  Anesthesia: local infiltration  Local anesthetic: lidocaine 1% w/o epinephrine  Anesthetic total: 1 ml  Irrigation method: syringe Amount of  cleaning: standard  Skin closure: suture  Number of sutures: 1  Technique: simple interrupted, 5-0 Prolene  Patient tolerance: Patient tolerated the procedure well with no immediate complications.  Labs Review Labs Reviewed - No data to display  Imaging Review No results found.   EKG Interpretation None      MDM   Final diagnoses:  None  Lip laceration Left humerus fracture  Patient presented to the ER for evaluation of a laceration to her left lower lip. Patient had started this I her wheelchair and was caught by staff, but she was helped to the ground, she hit her lip on a sharp object on the wheelchair. Patient had a 1 cm laceration that required repair. There is no history of any impact to the head. During evaluation, patient was noted to not be  moving her left shoulder well. She denied any pain, but because of the fall and decreased range of motion, x-ray was obtained. X-ray reveals humeral head fracture. This this finding, it was felt that the patient had more of a fall and was initially reported by EMS. Patient sent back to radiology for CT head, CT cervical spine, chest x-ray, thoracic spine, lumbosacral spine x-ray of bilateral hips. It was felt that these x-rays were necessary because the patient did not sense any pain with the left humeral fracture, presumably secondary to her severe dementia. It was felt necessary to look for further occult fractures. No other abnormalities were seen. Patient placed in a sling. She is to followup with orthopedics as an outpatient.   Gilda Creasehristopher J. Phylisha Dix, MD 05/26/14 [phone]

## 2014-05-26 NOTE — ED Notes (Signed)
[facility] present to take patient back to nursing home

## 2014-11-02 ENCOUNTER — Encounter: Payer: Self-pay | Admitting: Internal Medicine

## 2014-11-02 ENCOUNTER — Non-Acute Institutional Stay (SKILLED_NURSING_FACILITY): Payer: Medicare Other | Admitting: Internal Medicine

## 2014-11-02 DIAGNOSIS — K219 Gastro-esophageal reflux disease without esophagitis: Secondary | ICD-10-CM

## 2014-11-02 DIAGNOSIS — M1710 Unilateral primary osteoarthritis, unspecified knee: Secondary | ICD-10-CM

## 2014-11-02 DIAGNOSIS — F411 Generalized anxiety disorder: Secondary | ICD-10-CM | POA: Diagnosis not present

## 2014-11-02 DIAGNOSIS — R569 Unspecified convulsions: Secondary | ICD-10-CM

## 2014-11-02 DIAGNOSIS — E785 Hyperlipidemia, unspecified: Secondary | ICD-10-CM | POA: Insufficient documentation

## 2014-11-02 DIAGNOSIS — M24549 Contracture, unspecified hand: Secondary | ICD-10-CM | POA: Diagnosis not present

## 2014-11-02 DIAGNOSIS — G309 Alzheimer's disease, unspecified: Secondary | ICD-10-CM | POA: Diagnosis not present

## 2014-11-02 DIAGNOSIS — I10 Essential (primary) hypertension: Secondary | ICD-10-CM | POA: Insufficient documentation

## 2014-11-02 DIAGNOSIS — J45909 Unspecified asthma, uncomplicated: Secondary | ICD-10-CM | POA: Insufficient documentation

## 2014-11-02 DIAGNOSIS — N183 Chronic kidney disease, stage 3 (moderate): Secondary | ICD-10-CM | POA: Diagnosis not present

## 2014-11-02 DIAGNOSIS — J4520 Mild intermittent asthma, uncomplicated: Secondary | ICD-10-CM

## 2014-11-02 DIAGNOSIS — M179 Osteoarthritis of knee, unspecified: Secondary | ICD-10-CM

## 2014-11-02 DIAGNOSIS — F0280 Dementia in other diseases classified elsewhere without behavioral disturbance: Secondary | ICD-10-CM | POA: Insufficient documentation

## 2014-11-02 DIAGNOSIS — D638 Anemia in other chronic diseases classified elsewhere: Secondary | ICD-10-CM | POA: Diagnosis not present

## 2014-11-02 DIAGNOSIS — IMO0002 Reserved for concepts with insufficient information to code with codable children: Secondary | ICD-10-CM

## 2014-11-02 NOTE — Progress Notes (Signed)
Patient ID: [name], female   DOB: 1931-06-06, 81 y.o.   MRN: [id number]     [facility]   PCP: Florentina Jenny, MD  Code Status: DNR  Allergies  Allergen Reactions  . Penicillins Other (See Comments)    Per mar    Chief Complaint  Patient presents with  . New Admit To SNF    for long term placement     HPI:  81 year old patient is here for long term placement. She was residing in [facility] prior to this. She is seen in her room today with her daughter present at bedside. She can make her needs known, needs assistance with her ADLs, is pleasantly confused and denies any concerns this visit. No new concerns form staff, unable to obtain ROS from patient. She mentions she is not a complainer and doing well today She has PMH of dementia, HTN, anxiety, HLD, seizures, RLS among others  Review of Systems:  Unable to obtain    Past Medical History  Diagnosis Date  . Alzheimer disease   . Dementia   . Hypertension   . Hypercholesteremia   . GERD (gastroesophageal reflux disease)   . Hyponatremia   . Asthma   . Borderline diabetes   . Seizures     "one 5 years ago; none since as far as I know" (08/03/2013)  . Stroke     "said she had a mini stroke" (08/03/2013)  . Pneumonia 10/2012   Past Surgical History  Procedure Laterality Date  . Cataract extraction    . Total knee arthroplasty Bilateral   . Cataract extraction w/ intraocular lens  implant, bilateral Bilateral    Social History:   reports that she has never smoked. She has never used smokeless tobacco. She reports that she does not drink alcohol or use illicit drugs.  History reviewed. No pertinent family history.  Medications: Patient's Medications  New Prescriptions   No medications on file  Previous Medications   ALBUTEROL (PROVENTIL HFA;VENTOLIN HFA) 108 (90 BASE) MCG/ACT INHALER    Inhale 2 puffs into the lungs 2 (two) times daily as needed for  wheezing or shortness of breath. May use every 4 hours if needed   AMLODIPINE (NORVASC) 5 MG TABLET    Take 1 tablet (5 mg total) by mouth daily.   ASPIRIN EC 81 MG TABLET    Take 81 mg by mouth every other day.   CHOLECALCIFEROL (VITAMIN D) 1000 UNITS TABLET    Take 1,000 Units by mouth daily.     DIVALPROEX (DEPAKOTE SPRINKLE) 125 MG CAPSULE    Take 250 mg by mouth 2 (two) times daily.    DOCUSATE (COLACE) 50 MG/5ML LIQUID    Take 200 mg by mouth 2 (two) times daily.   DONEPEZIL (ARICEPT) 5 MG TABLET    Take 5 mg by mouth at bedtime.   FERROUS SULFATE 325 (65 FE) MG TABLET    Take 325 mg by mouth 2 (two) times daily.    FOLIC ACID (FOLVITE) 1 MG TABLET    Take 1 mg by mouth every other day.     HYDROCODONE-ACETAMINOPHEN (NORCO/VICODIN) 5-325 MG PER TABLET    Take 0.5 tablets by mouth every 4 (four) hours as needed for moderate pain.   LORAZEPAM (ATIVAN) 0.5 MG TABLET    Take 0.5 mg by mouth 2 (two) times daily. May take 1 tablet every 4 hours if needed   LOSARTAN (COZAAR) 100 MG TABLET  Take 100 mg by mouth daily.     MEMANTINE (NAMENDA) 10 MG TABLET    Take 10 mg by mouth 2 (two) times daily.   METOPROLOL (LOPRESSOR) 50 MG TABLET    Take 0.5 tablets (25 mg total) by mouth 2 (two) times daily.   MONTELUKAST (SINGULAIR) 10 MG TABLET    Take 10 mg by mouth at bedtime.     MULTIPLE VITAMIN (MULTIVITAMIN WITH MINERALS) TABS TABLET    Take 1 tablet by mouth daily.   PANTOPRAZOLE (PROTONIX) 40 MG TABLET    Take 40 mg by mouth daily.     ROPINIROLE (REQUIP) 0.25 MG TABLET    Take 0.25 mg by mouth at bedtime.   ROSUVASTATIN (CRESTOR) 10 MG TABLET    Take 10 mg by mouth daily.    Modified Medications   No medications on file  Discontinued Medications   DOCUSATE SODIUM (COLACE) 100 MG CAPSULE    Take 200 mg by mouth 2 (two) times daily.      Physical Exam: Filed Vitals:   11/02/14 1631  BP: 146/97  Pulse: 70  Temp: 98.1 F (36.7 C)  Resp: 18  SpO2: 99%   Constitutional: Frail, elderly,  pleasantly demented  HENT:  Head: Normocephalic and atraumatic.   Mouth/Throat: Oropharynx is clear and moist.  Eyes: Pupils are equal, round, and reactive to light.  Neck: Normal range of motion. Neck supple.  Cardiovascular: Normal rate and regular rhythm.   Pulmonary/Chest: Effort normal and breath sounds normal.  Abdominal: Soft. Bowel sounds are normal.  Musculoskeletal: Normal range of motion. She exhibits no edema. Generalized weakness, has contracture in both hands Neurological: She is alert. Oriented only to person  Skin: Skin is warm and dry.  Psychiatric: She has a normal mood and affect.    Labs reviewed: No recent labs for review  Assessment/Plan  alzhimer's dementia Stable, continue namenda 10 mg bid and aricpet 5 mg daily for now, decline anticipated, monitor po intake and weight. Fall precautions and pressure ulcer prophylaxis. Continue assistance with ADLs  Asthma  No recent exacerbation. Continue albuterol and singulair  Hypertension Currently under control. Continue amlodipine 5 mg daily, losartan 100 mg daily and metoprolol 50 mg bid. Continue ASA and statin  Hyperlipidemia  Continued crestor 10 mg daily  Seizures Last one 5 years back as per daughter. Continue depakote sprinkles 250 mg bid  CKD stage III Monitor renal function, avoid NSAIDS  Anemia of chronic disease Continue ferrous sulfate and folic acid supplement. Monitor cbc  Anxiety On ativan 0.5 mg bid and prn, monitor clinically and wean her off it if tolerated  gerd On protonix 40 mg daily, consider decreasing dosing next visit  OA With contracture in left hand. Continue prn norco and vitamin d supplement for now  Hand contractures Needs assistance with ADLs, will have pt work with therapy team     Goals of care: long term care   Labs/tests ordered: cbc with diff, cmp, lipid panel, tsh, vitamin d next lab  Family/ staff Communication: reviewed care plan with patient and nursing  supervisor    Oneal GroutMAHIMA Keondre Markson, MD  [facility] [phone] (Monday-Friday 8 am - 5 pm) [phone] (afterhours)

## 2014-11-22 LAB — CBC AND DIFFERENTIAL
HCT: 34 % — AB (ref 36–46)
Hemoglobin: 11.3 g/dL — AB (ref 12.0–16.0)
PLATELETS: 164 10*3/uL (ref 150–399)
WBC: 6 10^3/mL

## 2014-11-22 LAB — HEPATIC FUNCTION PANEL
ALK PHOS: 49 U/L (ref 25–125)
ALT: 8 U/L (ref 7–35)
AST: 12 U/L — AB (ref 13–35)

## 2014-11-22 LAB — BASIC METABOLIC PANEL
BUN: 21 mg/dL (ref 4–21)
Creatinine: 0.8 mg/dL (ref <=1.1)
Glucose: 77 mg/dL
Potassium: 4.4 mmol/L (ref 3.4–5.3)
Sodium: 142 mmol/L (ref 137–147)

## 2015-01-02 ENCOUNTER — Other Ambulatory Visit: Payer: Self-pay | Admitting: *Deleted

## 2015-01-02 MED ORDER — LORAZEPAM 0.5 MG PO TABS: ORAL_TABLET | ORAL | Status: DC

## 2015-01-02 NOTE — Telephone Encounter (Signed)
[facility] 

## 2015-01-10 ENCOUNTER — Non-Acute Institutional Stay (SKILLED_NURSING_FACILITY): Payer: Medicare Other | Admitting: Adult Health

## 2015-01-10 ENCOUNTER — Encounter: Payer: Self-pay | Admitting: Adult Health

## 2015-01-10 DIAGNOSIS — R569 Unspecified convulsions: Secondary | ICD-10-CM | POA: Diagnosis not present

## 2015-01-10 DIAGNOSIS — M179 Osteoarthritis of knee, unspecified: Secondary | ICD-10-CM | POA: Diagnosis not present

## 2015-01-10 DIAGNOSIS — K219 Gastro-esophageal reflux disease without esophagitis: Secondary | ICD-10-CM

## 2015-01-10 DIAGNOSIS — E43 Unspecified severe protein-calorie malnutrition: Secondary | ICD-10-CM | POA: Diagnosis not present

## 2015-01-10 DIAGNOSIS — J4520 Mild intermittent asthma, uncomplicated: Secondary | ICD-10-CM | POA: Diagnosis not present

## 2015-01-10 DIAGNOSIS — E785 Hyperlipidemia, unspecified: Secondary | ICD-10-CM

## 2015-01-10 DIAGNOSIS — F419 Anxiety disorder, unspecified: Secondary | ICD-10-CM | POA: Diagnosis not present

## 2015-01-10 DIAGNOSIS — G2581 Restless legs syndrome: Secondary | ICD-10-CM

## 2015-01-10 DIAGNOSIS — G309 Alzheimer's disease, unspecified: Secondary | ICD-10-CM | POA: Diagnosis not present

## 2015-01-10 DIAGNOSIS — F0280 Dementia in other diseases classified elsewhere without behavioral disturbance: Secondary | ICD-10-CM

## 2015-01-10 DIAGNOSIS — M1710 Unilateral primary osteoarthritis, unspecified knee: Secondary | ICD-10-CM

## 2015-01-10 DIAGNOSIS — K5900 Constipation, unspecified: Secondary | ICD-10-CM | POA: Diagnosis not present

## 2015-01-10 DIAGNOSIS — I10 Essential (primary) hypertension: Secondary | ICD-10-CM

## 2015-01-10 DIAGNOSIS — IMO0002 Reserved for concepts with insufficient information to code with codable children: Secondary | ICD-10-CM

## 2015-01-10 NOTE — Progress Notes (Signed)
Patient ID: Ashley FenderFrances Macgowan, female   DOB: 1931-07-25, 81 y.o.   MRN: [id number]   01/10/2015  Facility:  Nursing Home Location:  [facility] Room Number: 302-2 LEVEL OF CARE:  SNF (31)   Chief Complaint  Patient presents with  . Medical Management of Chronic Issues    Seizure disorder, hyperlipidemia, hypertension, GERD, anxiety, constipation, Alzheimer's disease, restless leg syndrome, asthma and osteoarthrosis    HISTORY OF PRESENT ILLNESS:  This is an 81 year old female who weighs a [facility]. She is [facility]. She is confused and disoriented. Her blood pressure has been stable for the past month and currently taking Norvasc, Cozaar and Lopressor for hypertension. Her mood this is stable and currently taking Ativan for anxiety. No seizure episode and currently taking Depakote. She has advanced Alzheimer's disease and currently taking Aricept and Namenda.  She has PMH of dementia, hypertension, anxiety, hyperlipidemia, seizure and restless leg syndrome.  PAST MEDICAL HISTORY:  Past Medical History  Diagnosis Date  . Alzheimer disease   . Dementia   . Hypertension   . Hypercholesteremia   . GERD (gastroesophageal reflux disease)   . Hyponatremia   . Asthma   . Borderline diabetes   . Seizures     "one 5 years ago; none since as far as I know" (08/03/2013)  . Stroke     "said she had a mini stroke" (08/03/2013)  . Pneumonia 10/2012    CURRENT MEDICATIONS: Reviewed per MAR/see medication list  Allergies  Allergen Reactions  . Penicillins Other (See Comments)    Per mar     REVIEW OF SYSTEMS:  Unable to obtain due to advanced dementia  PHYSICAL EXAMINATION  GENERAL: no acute distress, normal body habitus EYES: conjunctivae normal, sclerae normal, normal eye lids NECK: supple, trachea midline, no neck masses, no thyroid tenderness, no thyromegaly LYMPHATICS: no LAN in the neck,  no supraclavicular LAN RESPIRATORY: breathing is even & unlabored, BS CTAB CARDIAC: RRR, no murmur,no extra heart sounds, no edema GI: abdomen soft, normal BS, no masses, no tenderness, no hepatomegaly, no splenomegaly EXTREMITIES: right hand is contracted; limited ROM on bilateral shoulders; generalized weakness on BLE PSYCHIATRIC: the patient is alert & oriented to person, affect & behavior appropriate  LABS/RADIOLOGY: Labs reviewed: 11/22/14  WBC 6.0 hemoglobin 11.3 hematocrit 34.3 MCV 90.7 platelet 164 sodium 142 potassium 4.4 glucose 77 BUN 21 creatinine 0.82 calcium 8.5 cholesterol 119 triglycerides 46 HDL 50 LDL 60 total bilirubin 0.4 direct bilirubin 0.1 indirect bilirubin 0.3 alkaline phosphatase 49 SGOT 12 SGPT<8 total protein 5.3 albumin 3.0 but folic acid 38.5 11/03/14  WBC 6.0 hemoglobin 12.7 hematocrit 37.6 MCV 88.9 platelet 183 sodium 139 potassium 4.6 glucose 18 BUN 26 creatinine 0.97 total bilirubin 0.5 alkaline phosphatase 59 SGOT 18 SGPT 8 total protein 6.1 albumin 3.5 calcium 9.1 cholesterol 148 triglycerides 35 HDL 71 LDL 70 TSH 2.356 vitamin D 42    ASSESSMENT/PLAN:  Seizure - continue Depakote 250 mg by mouth twice a day Hypertension - well-controlled; continue Norvasc 5 mg by mouth daily, Cozaar 100 mg by mouth daily and Lopressor 25 mg by mouth twice a day Hyperlipidemia - continue Crestor 10 mg by mouth daily GERD - continue Protonix 40 mg by mouth daily Anxiety - mood is stable; continue Ativan 0.5 mg 1 tab by mouth twice a day and Ativan 0.5 mg by mouth every 4 hours when necessary Constipation - continue Colace 200 mg/20  mL by mouth twice a day Alzheimer's disease - advanced; continue Namenda 10 mg by mouth twice a day and Aricept 5 mg by mouth daily Restless leg syndrome - continue reck with 0.25 mg by mouth daily at bedtime Asthma - continue albuterol 2 puffs twice a day and albuterol 2 puffs every 4 hours when necessary Osteoarthrosis - continue Norco 5/325 mg  1/2 tab by mouth every 4 hours when necessary Protein-calorie malnutrition, severe - albumin 3.0; RD consultation    Goals of care:  Long-term care; Daylene Posey, NP [facility] [phone]

## 2015-02-17 ENCOUNTER — Non-Acute Institutional Stay (SKILLED_NURSING_FACILITY): Payer: Medicare Other | Admitting: Adult Health

## 2015-02-17 ENCOUNTER — Encounter: Payer: Self-pay | Admitting: Adult Health

## 2015-02-17 DIAGNOSIS — K219 Gastro-esophageal reflux disease without esophagitis: Secondary | ICD-10-CM | POA: Diagnosis not present

## 2015-02-17 DIAGNOSIS — F419 Anxiety disorder, unspecified: Secondary | ICD-10-CM

## 2015-02-17 DIAGNOSIS — L8992 Pressure ulcer of unspecified site, stage 2: Secondary | ICD-10-CM | POA: Diagnosis not present

## 2015-02-17 DIAGNOSIS — I10 Essential (primary) hypertension: Secondary | ICD-10-CM | POA: Diagnosis not present

## 2015-02-17 DIAGNOSIS — E43 Unspecified severe protein-calorie malnutrition: Secondary | ICD-10-CM

## 2015-02-17 DIAGNOSIS — G2581 Restless legs syndrome: Secondary | ICD-10-CM

## 2015-02-17 DIAGNOSIS — E785 Hyperlipidemia, unspecified: Secondary | ICD-10-CM

## 2015-02-17 DIAGNOSIS — R569 Unspecified convulsions: Secondary | ICD-10-CM | POA: Diagnosis not present

## 2015-02-17 DIAGNOSIS — M24549 Contracture, unspecified hand: Secondary | ICD-10-CM

## 2015-02-17 DIAGNOSIS — G309 Alzheimer's disease, unspecified: Secondary | ICD-10-CM | POA: Diagnosis not present

## 2015-02-17 DIAGNOSIS — IMO0002 Reserved for concepts with insufficient information to code with codable children: Secondary | ICD-10-CM

## 2015-02-17 DIAGNOSIS — M1710 Unilateral primary osteoarthritis, unspecified knee: Secondary | ICD-10-CM

## 2015-02-17 DIAGNOSIS — K5900 Constipation, unspecified: Secondary | ICD-10-CM

## 2015-02-17 DIAGNOSIS — F0280 Dementia in other diseases classified elsewhere without behavioral disturbance: Secondary | ICD-10-CM

## 2015-02-17 DIAGNOSIS — M179 Osteoarthritis of knee, unspecified: Secondary | ICD-10-CM | POA: Diagnosis not present

## 2015-02-19 NOTE — Progress Notes (Signed)
Patient ID: [name], female   DOB: February 17, 1931, 81 y.o.   MRN: [id number]   02/17/15  Facility:  Nursing Home Location:  [facility] Room Number: 302-2 LEVEL OF CARE:  SNF (31)   Chief Complaint  Patient presents with  . Medical Management of Chronic Issues    Seizure disorder, hyperlipidemia, hypertension, GERD, anxiety, constipation, Alzheimer's disease, restless leg syndrome, protein-calorie malnutrition, pressure ulcers stage 2, right hand contraction and osteoarthrosis    HISTORY OF PRESENT ILLNESS:  This is an 81 year old female who is being seen for a routine visit. She is a long-term care resident and is on hospice/comfort care.  She is confused and disoriented. Her blood pressure has been stable for the past month and currently taking Norvasc, Cozaar and Lopressor for hypertension. Her mood this is stable and currently taking Ativan for anxiety. No seizure episode and currently taking Depakote. She has advanced Alzheimer's disease and currently taking Aricept and Namenda.  She has PMH of dementia, hypertension, anxiety, hyperlipidemia, seizure and restless leg syndrome.  PAST MEDICAL HISTORY:  Past Medical History  Diagnosis Date  . Alzheimer disease   . Dementia   . Hypertension   . Hypercholesteremia   . GERD (gastroesophageal reflux disease)   . Hyponatremia   . Asthma   . Borderline diabetes   . Seizures     "one 5 years ago; none since as far as I know" (08/03/2013)  . Stroke     "said she had a mini stroke" (08/03/2013)  . Pneumonia 10/2012    CURRENT MEDICATIONS: Reviewed per MAR/see medication list  Allergies  Allergen Reactions  . Penicillins Other (See Comments)    Per mar     REVIEW OF SYSTEMS:  Unable to obtain due to advanced dementia  PHYSICAL EXAMINATION  GENERAL: no acute distress, normal body habitus SKIN:  scarum pressure ulcer stage 2 EYES: conjunctivae normal, sclerae normal, normal eye lids GENERAL:  no acute distress, normal body habitus NECK: supple, trachea midline, no neck masses, no thyroid tenderness, no thyromegaly LYMPHATICS: no LAN in the neck, no supraclavicular LAN RESPIRATORY: breathing is even & unlabored, BS CTAB CARDIAC: RRR, no murmur,no extra heart sounds, no edema GI: abdomen soft, normal BS, no masses, no tenderness, no hepatomegaly, no splenomegaly EXTREMITIES: right hand is contracted; limited ROM on bilateral shoulders; generalized weakness on BLE PSYCHIATRIC: the patient is alert & oriented to person, affect & behavior appropriate  LABS/RADIOLOGY: Labs reviewed: 11/22/14  WBC 6.0 hemoglobin 11.3 hematocrit 34.3 MCV 90.7 platelet 164 sodium 142 potassium 4.4 glucose 77 BUN 21 creatinine 0.82 calcium 8.5 cholesterol 119 triglycerides 46 HDL 50 LDL 60 total bilirubin 0.4 direct bilirubin 0.1 indirect bilirubin 0.3 alkaline phosphatase 49 SGOT 12 SGPT<8 total protein 5.3 albumin 3.0 but folic acid 38.5 11/03/14  WBC 6.0 hemoglobin 12.7 hematocrit 37.6 MCV 88.9 platelet 183 sodium 139 potassium 4.6 glucose 18 BUN 26 creatinine 0.97 total bilirubin 0.5 alkaline phosphatase 59 SGOT 18 SGPT 8 total protein 6.1 albumin 3.5 calcium 9.1 cholesterol 148 triglycerides 35 HDL 71 LDL 70 TSH 2.356 vitamin D 42    ASSESSMENT/PLAN:  Seizure - continue Depakote 250 mg by mouth twice a day Hypertension - well-controlled; continue Norvasc 5 mg by mouth daily, Cozaar 100 mg by mouth daily and Lopressor 25 mg by mouth twice a day; check CBC  Hyperlipidemia - continue Crestor 10 mg by mouth daily GERD - continue Protonix 40 mg by mouth daily Anxiety - mood is stable; continue  Ativan 0.5 mg 1 tab by mouth twice a day and Ativan 0.5 mg by mouth every 4 hours when necessary Constipation - continue Colace 200 mg/20 mL by mouth twice a day Alzheimer's disease - advanced; continue Namenda 10 mg by mouth twice a day and Aricept 5 mg by mouth daily Restless leg syndrome - continue Requip 0.25 mg by  mouth daily at bedtime Osteoarthrosis - continue Norco 5/325 mg 1/2 tab by mouth every 4 hours when necessary Protein-calorie malnutrition, severe - albumin 3.0; continue supplementation; check CMP Pressure ulcers stage 2 - on sacrum; continue treatment Contraction of right hand - continue to keep hand clean and splint   Goals of care:  Long-term care; Daylene Posey, NP [facility] [phone]

## 2015-03-23 ENCOUNTER — Encounter: Payer: Self-pay | Admitting: Adult Health

## 2015-03-23 ENCOUNTER — Non-Acute Institutional Stay (SKILLED_NURSING_FACILITY): Payer: Medicare Other | Admitting: Adult Health

## 2015-03-23 DIAGNOSIS — IMO0002 Reserved for concepts with insufficient information to code with codable children: Secondary | ICD-10-CM

## 2015-03-23 DIAGNOSIS — K219 Gastro-esophageal reflux disease without esophagitis: Secondary | ICD-10-CM

## 2015-03-23 DIAGNOSIS — M24549 Contracture, unspecified hand: Secondary | ICD-10-CM

## 2015-03-23 DIAGNOSIS — M179 Osteoarthritis of knee, unspecified: Secondary | ICD-10-CM | POA: Diagnosis not present

## 2015-03-23 DIAGNOSIS — F419 Anxiety disorder, unspecified: Secondary | ICD-10-CM

## 2015-03-23 DIAGNOSIS — F0280 Dementia in other diseases classified elsewhere without behavioral disturbance: Secondary | ICD-10-CM

## 2015-03-23 DIAGNOSIS — M1710 Unilateral primary osteoarthritis, unspecified knee: Secondary | ICD-10-CM

## 2015-03-23 DIAGNOSIS — L8992 Pressure ulcer of unspecified site, stage 2: Secondary | ICD-10-CM | POA: Diagnosis not present

## 2015-03-23 DIAGNOSIS — K5900 Constipation, unspecified: Secondary | ICD-10-CM | POA: Diagnosis not present

## 2015-03-23 DIAGNOSIS — G309 Alzheimer's disease, unspecified: Secondary | ICD-10-CM | POA: Diagnosis not present

## 2015-03-23 DIAGNOSIS — G2581 Restless legs syndrome: Secondary | ICD-10-CM | POA: Diagnosis not present

## 2015-03-23 DIAGNOSIS — E43 Unspecified severe protein-calorie malnutrition: Secondary | ICD-10-CM | POA: Diagnosis not present

## 2015-03-23 DIAGNOSIS — I10 Essential (primary) hypertension: Secondary | ICD-10-CM

## 2015-03-23 DIAGNOSIS — E785 Hyperlipidemia, unspecified: Secondary | ICD-10-CM

## 2015-03-23 DIAGNOSIS — R569 Unspecified convulsions: Secondary | ICD-10-CM | POA: Diagnosis not present

## 2015-03-24 NOTE — Progress Notes (Signed)
Patient ID: [name], female   DOB: Sep 15, 1930, 81 y.o.   MRN: [id number]   03/24/15  Facility:  Nursing Home Location:  [facility] Room Number: 302-2 LEVEL OF CARE:  SNF (31)   Chief Complaint  Patient presents with  . Medical Management of Chronic Issues    Seizure disorder, hyperlipidemia, hypertension, GERD, anxiety, constipation, Alzheimer's disease, restless leg syndrome, protein-calorie malnutrition, pressure ulcers stage 2, right hand contraction and osteoarthrosis    HISTORY OF PRESENT ILLNESS:  This is an 81 year old female who is being seen for a routine visit. She is a long-term care resident and is on hospice/comfort care.  She is confused and disoriented. She has been stable for the past month. She continues to have sacral pressure sore stage 2 and treated with hydrocolloid. Her mood is stable and takes Ativan.  She has PMH of dementia, hypertension, anxiety, hyperlipidemia, seizure and restless leg syndrome.  PAST MEDICAL HISTORY:  Past Medical History  Diagnosis Date  . Alzheimer disease   . Dementia   . Hypertension   . Hypercholesteremia   . GERD (gastroesophageal reflux disease)   . Hyponatremia   . Asthma   . Borderline diabetes   . Seizures     "one 5 years ago; none since as far as I know" (08/03/2013)  . Stroke     "said she had a mini stroke" (08/03/2013)  . Pneumonia 10/2012    CURRENT MEDICATIONS: Reviewed per MAR/see medication list    Medication List       This list is accurate as of: 03/23/15 11:59 PM.  Always use your most recent med list.               amLODipine 5 MG tablet  Commonly known as:  NORVASC  Take 1 tablet (5 mg total) by mouth daily.     aspirin EC 81 MG tablet  Take 81 mg by mouth every other day.     cholecalciferol 1000 UNITS tablet  Commonly known as:  VITAMIN D  Take 1,000 Units by mouth daily.     divalproex 125 MG capsule  Commonly known as:  DEPAKOTE SPRINKLE  Take 250  mg by mouth 2 (two) times daily.     docusate 50 MG/5ML liquid  Commonly known as:  COLACE  Administer 20 ml by mouth twice daily for bowel aide     donepezil 5 MG tablet  Commonly known as:  ARICEPT  Take 5 mg by mouth at bedtime.     folic acid 1 MG tablet  Commonly known as:  FOLVITE  Take 1 mg by mouth every other day.     HYDROcodone-acetaminophen 5-325 MG per tablet  Commonly known as:  NORCO/VICODIN  Take 0.5 tablets by mouth every 4 (four) hours as needed for moderate pain.     loperamide 2 MG tablet  Commonly known as:  IMODIUM A-D  Take 2 tablets =4 mg by mouth after each loose stool as needed     LORazepam 0.5 MG tablet  Commonly known as:  ATIVAN  Take one tablet by mouth twice daily for anxiety/agitation; Take one tablet by mouth every 4 hours as needed for anxiety     losartan 100 MG tablet  Commonly known as:  COZAAR  Take 100 mg by mouth daily.     memantine 10 MG tablet  Commonly known as:  NAMENDA  Take 10 mg by mouth 2 (two) times daily.  metoprolol tartrate 25 MG tablet  Commonly known as:  LOPRESSOR  Take 25 mg by mouth 2 (two) times daily.     montelukast 10 MG tablet  Commonly known as:  SINGULAIR  Take 10 mg by mouth at bedtime.     multivitamin with minerals Tabs tablet  Take 1 tablet by mouth daily.     pantoprazole 40 MG tablet  Commonly known as:  PROTONIX  Take 40 mg by mouth daily.     rOPINIRole 0.25 MG tablet  Commonly known as:  REQUIP  Take 0.25 mg by mouth at bedtime.     rosuvastatin 10 MG tablet  Commonly known as:  CRESTOR  Take 10 mg by mouth daily.         Allergies  Allergen Reactions  . Penicillins Other (See Comments)    Per mar     REVIEW OF SYSTEMS:  Unable to obtain due to advanced dementia  PHYSICAL EXAMINATION  GENERAL: no acute distress, normal body habitus SKIN:  scarum pressure ulcer stage 2 GENERAL: no acute distress, normal body habitus NECK: supple, trachea midline, no neck masses, no  thyroid tenderness, no thyromegaly LYMPHATICS: no LAN in the neck, no supraclavicular LAN RESPIRATORY: breathing is even & unlabored, BS CTAB CARDIAC: RRR, no murmur,no extra heart sounds, no edema GI: abdomen soft, normal BS, no masses, no tenderness, no hepatomegaly, no splenomegaly EXTREMITIES: right hand is contracted; limited ROM on bilateral shoulders; generalized weakness on BLE PSYCHIATRIC: affect & behavior appropriate  LABS/RADIOLOGY: Labs reviewed: 11/22/14  WBC 6.0 hemoglobin 11.3 hematocrit 34.3 MCV 90.7 platelet 164 sodium 142 potassium 4.4 glucose 77 BUN 21 creatinine 0.82 calcium 8.5 cholesterol 119 triglycerides 46 HDL 50 LDL 60 total bilirubin 0.4 direct bilirubin 0.1 indirect bilirubin 0.3 alkaline phosphatase 49 SGOT 12 SGPT<8 total protein 5.3 albumin 3.0 but folic acid 38.5 11/03/14  WBC 6.0 hemoglobin 12.7 hematocrit 37.6 MCV 88.9 platelet 183 sodium 139 potassium 4.6 glucose 18 BUN 26 creatinine 0.97 total bilirubin 0.5 alkaline phosphatase 59 SGOT 18 SGPT 8 total protein 6.1 albumin 3.5 calcium 9.1 cholesterol 148 triglycerides 35 HDL 71 LDL 70 TSH 2.356 vitamin D 42    ASSESSMENT/PLAN:  Seizure - continue Depakote 250 mg by mouth twice a day Hypertension - well-controlled; continue Norvasc 5 mg by mouth daily, Cozaar 100 mg by mouth daily and Lopressor 25 mg by mouth twice a day  Hyperlipidemia - continue Crestor 10 mg by mouth daily GERD - continue Protonix 40 mg by mouth daily Anxiety - mood is stable; continue Ativan 0.5 mg 1 tab by mouth twice a day and Ativan 0.5 mg by mouth every 4 hours when necessary Constipation - continue Colace 200 mg/20 mL by mouth twice a day Alzheimer's disease - advanced; continue Namenda 10 mg by mouth twice a day and Aricept 5 mg by mouth daily Restless leg syndrome - continue Requip 0.25 mg by mouth daily at bedtime Osteoarthrosis - continue Norco 5/325 mg 1/2 tab by mouth every 4 hours when necessary Protein-calorie malnutrition,  severe - albumin 3.0; continue supplementation Pressure ulcers stage 2 - on sacrum; continue treatment Contraction of right hand - continue to keep hand clean and splint   Goals of care:  Long-term care; Daylene Posey, NP [facility] [phone]

## 2015-04-24 ENCOUNTER — Non-Acute Institutional Stay (SKILLED_NURSING_FACILITY): Payer: Medicare Other | Admitting: Internal Medicine

## 2015-04-24 DIAGNOSIS — M1710 Unilateral primary osteoarthritis, unspecified knee: Secondary | ICD-10-CM

## 2015-04-24 DIAGNOSIS — J4520 Mild intermittent asthma, uncomplicated: Secondary | ICD-10-CM | POA: Diagnosis not present

## 2015-04-24 DIAGNOSIS — K219 Gastro-esophageal reflux disease without esophagitis: Secondary | ICD-10-CM

## 2015-04-24 DIAGNOSIS — R569 Unspecified convulsions: Secondary | ICD-10-CM

## 2015-04-24 DIAGNOSIS — N183 Chronic kidney disease, stage 3 (moderate): Secondary | ICD-10-CM

## 2015-04-24 DIAGNOSIS — R6883 Chills (without fever): Secondary | ICD-10-CM | POA: Diagnosis not present

## 2015-04-24 DIAGNOSIS — L89152 Pressure ulcer of sacral region, stage 2: Secondary | ICD-10-CM

## 2015-04-24 DIAGNOSIS — M179 Osteoarthritis of knee, unspecified: Secondary | ICD-10-CM

## 2015-04-24 DIAGNOSIS — G309 Alzheimer's disease, unspecified: Secondary | ICD-10-CM | POA: Diagnosis not present

## 2015-04-24 DIAGNOSIS — I10 Essential (primary) hypertension: Secondary | ICD-10-CM | POA: Diagnosis not present

## 2015-04-24 DIAGNOSIS — L89154 Pressure ulcer of sacral region, stage 4: Secondary | ICD-10-CM | POA: Insufficient documentation

## 2015-04-24 DIAGNOSIS — IMO0002 Reserved for concepts with insufficient information to code with codable children: Secondary | ICD-10-CM

## 2015-04-24 DIAGNOSIS — F0280 Dementia in other diseases classified elsewhere without behavioral disturbance: Secondary | ICD-10-CM

## 2015-04-24 NOTE — Progress Notes (Signed)
Patient ID: [name], female   DOB: [birth year], 81 y.o.   MRN: [id number]     [facility]   PCP: Florentina Jenny, MD  Code Status: DNR  Allergies  Allergen Reactions  . Penicillins Other (See Comments)    Per mar    Chief Complaint  Patient presents with  . Medical Management of Chronic Issues     HPI:  81 year old patient is seen for routine visit. She had been under hospice care and has now been discharged from their service. She has been at her baseline per nursing staff. She requires assistance with feeding. No falls reported. Has  Stage 2 pressure ulcer to her sacral area. She has been shivering and mentions being cold. Behavior has been stable. Tolerating pureed feed well. She can make her needs known. Unable to obtain ROS from patient. She has PMH of dementia, HTN, anxiety, HLD, seizures, RLS among others  Review of Systems:  Unable to obtain    Past Medical History  Diagnosis Date  . Alzheimer disease   . Dementia   . Hypertension   . Hypercholesteremia   . GERD (gastroesophageal reflux disease)   . Hyponatremia   . Asthma   . Borderline diabetes   . Seizures     "one 5 years ago; none since as far as I know" (08/03/2013)  . Stroke     "said she had a mini stroke" (08/03/2013)  . Pneumonia 10/2012    Medications: Patient's Medications  New Prescriptions   No medications on file  Previous Medications   AMLODIPINE (NORVASC) 5 MG TABLET    Take 1 tablet (5 mg total) by mouth daily.   ASPIRIN EC 81 MG TABLET    Take 81 mg by mouth every other day.   CHOLECALCIFEROL (VITAMIN D) 1000 UNITS TABLET    Take 1,000 Units by mouth daily.     DIVALPROEX (DEPAKOTE SPRINKLE) 125 MG CAPSULE    Take 250 mg by mouth 2 (two) times daily.    DOCUSATE (COLACE) 50 MG/5ML LIQUID    Administer 20 ml by mouth twice daily for bowel aide   DONEPEZIL (ARICEPT) 5 MG TABLET    Take 5 mg by mouth at bedtime.   FOLIC ACID (FOLVITE) 1 MG TABLET     Take 1 mg by mouth every other day.     HYDROCODONE-ACETAMINOPHEN (NORCO/VICODIN) 5-325 MG PER TABLET    Take 0.5 tablets by mouth every 4 (four) hours as needed for moderate pain.   LOPERAMIDE (IMODIUM A-D) 2 MG TABLET    Take 2 tablets =4 mg by mouth after each loose stool as needed   LORAZEPAM (ATIVAN) 0.5 MG TABLET    Take one tablet by mouth twice daily for anxiety/agitation; Take one tablet by mouth every 4 hours as needed for anxiety   LOSARTAN (COZAAR) 100 MG TABLET    Take 100 mg by mouth daily.     MEMANTINE (NAMENDA) 10 MG TABLET    Take 10 mg by mouth 2 (two) times daily.   METOPROLOL TARTRATE (LOPRESSOR) 25 MG TABLET    Take 25 mg by mouth 2 (two) times daily.   MONTELUKAST (SINGULAIR) 10 MG TABLET    Take 10 mg by mouth at bedtime.     MULTIPLE VITAMIN (MULTIVITAMIN WITH MINERALS) TABS TABLET    Take 1 tablet by mouth daily.   PANTOPRAZOLE (PROTONIX) 40 MG TABLET    Take 40 mg by mouth daily.  ROPINIROLE (REQUIP) 0.25 MG TABLET    Take 0.25 mg by mouth at bedtime.   ROSUVASTATIN (CRESTOR) 10 MG TABLET    Take 10 mg by mouth daily.    Modified Medications   No medications on file  Discontinued Medications   No medications on file     Physical Exam: Filed Vitals:   04/24/15 1550  BP: 121/68  Pulse: 71  Temp: 97.2 F (36.2 C)  Resp: 18  Height:  (1.575 m)  Weight: 125 lb 3.2 oz (56.79 kg)  SpO2: 99%   Wt Readings from Last 3 Encounters:  04/24/15 125 lb 3.2 oz (56.79 kg)  03/23/15 126 lb 3.2 oz (57.244 kg)  02/17/15 122 lb 9.6 oz (55.611 kg)    Constitutional: Frail, elderly, pleasantly demented  HENT:  Head: Normocephalic and atraumatic.   Mouth/Throat: Oropharynx is clear and moist.  Eyes: Pupils are equal, round, and reactive to light.  Neck: Normal range of motion. Neck supple.  Cardiovascular: Normal rate and regular rhythm.   Pulmonary/Chest: Effort normal and breath sounds normal.  Abdominal: Soft. Bowel sounds are normal.  Musculoskeletal: She  exhibits no edema. Generalized weakness, has contracture in both hands Neurological: She is alert. Oriented only to person  Skin: Skin is warm and dry. Stage 2 sacral pressure ulcer Psychiatric: She has a normal mood and affect.    Labs reviewed: CBC Latest Ref Rng 11/22/2014 08/05/2013 08/03/2013  WBC - 6.0 6.9 6.7  Hemoglobin 12.0 - 16.0 g/dL 11.3(A) 10.9(L) 12.1  Hematocrit 36 - 46 % 34(A) 32.9(L) 35.7(L)  Platelets 150 - 399 K/L 164 177 214   CMP Latest Ref Rng 11/22/2014 08/05/2013 08/03/2013  Glucose 70 - 99 mg/dL - 82 -  BUN 4 - 21 mg/dL 21 40(J) -  Creatinine .5 - 1.1 mg/dL 0.8 8.11(B) 1.47  Sodium 137 - 147 mmol/L 142 137 -  Potassium 3.4 - 5.3 mmol/L 4.4 4.5 -  Chloride 96 - 112 mEq/L - 100 -  CO2 19 - 32 mEq/L - 30 -  Calcium 8.4 - 10.5 mg/dL - 8.9 -  Total Protein 6.0 - 8.3 g/dL - - -  Total Bilirubin 0.3 - 1.2 mg/dL - - -  Alkaline Phos 25 - 125 U/L 49 - -  AST 13 - 35 U/L 12(A) - -  ALT 7 - 35 U/L 8 - -     Assessment/Plan  Chills Normal body temperature. Will get her thyroid function checked  alzhimer's dementia Stable, continue namenda 10 mg bid and aricpet 5 mg daily for now, decline anticipated, monitor po intake and weight. Fall precautions and pressure ulcer prophylaxis. Continue assistance with ADLs, continue pureed diet  Stage 2 sacral pressure ulcer Continue decubivite daily and wound care. Pressure ulcer prophylaxis.   gerd Stable, decrease protonix to 20 mg daily and monitor  Hyperlipidemia  Continued crestor 10 mg daily and check lipid panel  HTN Stable, continue amlodipine 5 mg daily, losartan 100 mg daily and metoprolol 50 mg bid. Continue ASA and statin  Asthma  No recent exacerbation. Continue singulair  Seizures Remains seizure free. Continue depakote sprinkles 250 mg bid  CKD stage III Monitor renal function, avoid NSAIDS  OA With contracture in left hand. Continue prn norco and vitamin d supplement for now   [facility],  MD  [facility] [phone] (Monday-Friday 8 am - 5 pm) [phone] (afterhours)

## 2015-04-25 LAB — HEPATIC FUNCTION PANEL
ALT: 8 U/L (ref 7–35)
AST: 12 U/L — AB (ref 13–35)
Alkaline Phosphatase: 57 U/L (ref 25–125)
Bilirubin, Total: 0.4 mg/dL

## 2015-04-25 LAB — TSH: TSH: 2.15 u[IU]/mL (ref 0.41–5.90)

## 2015-04-25 LAB — CBC AND DIFFERENTIAL
HEMATOCRIT: 39 % (ref 36–46)
HEMOGLOBIN: 12.3 g/dL (ref 12.0–16.0)
Neutrophils Absolute: 4 /uL
Platelets: 188 10*3/uL (ref 150–399)
WBC: 7.7 10^3/mL

## 2015-04-25 LAB — LIPID PANEL
CHOLESTEROL: 141 mg/dL (ref 0–200)
HDL: 56 mg/dL (ref 35–70)
LDL Cholesterol: 72 mg/dL
Triglycerides: 66 mg/dL (ref 40–160)

## 2015-04-25 LAB — BASIC METABOLIC PANEL
BUN: 30 mg/dL — AB (ref 4–21)
CREATININE: 1.1 mg/dL (ref 0.5–1.1)
Glucose: 145 mg/dL
POTASSIUM: 4.7 mmol/L (ref 3.4–5.3)
SODIUM: 142 mmol/L (ref 137–147)

## 2015-07-16 ENCOUNTER — Emergency Department (HOSPITAL_COMMUNITY)
Admission: EM | Admit: 2015-07-16 | Discharge: 2015-07-16 | Disposition: A | Discharge status: Home / Self Care | Payer: Medicare HMO | Attending: Emergency Medicine | Admitting: Emergency Medicine

## 2015-07-16 ENCOUNTER — Encounter (HOSPITAL_COMMUNITY): Payer: Self-pay | Admitting: Emergency Medicine

## 2015-07-16 ENCOUNTER — Emergency Department (HOSPITAL_COMMUNITY): Payer: Medicare HMO

## 2015-07-16 DIAGNOSIS — K219 Gastro-esophageal reflux disease without esophagitis: Secondary | ICD-10-CM | POA: Insufficient documentation

## 2015-07-16 DIAGNOSIS — R55 Syncope and collapse: Secondary | ICD-10-CM | POA: Diagnosis not present

## 2015-07-16 DIAGNOSIS — R112 Nausea with vomiting, unspecified: Secondary | ICD-10-CM | POA: Diagnosis not present

## 2015-07-16 DIAGNOSIS — F0280 Dementia in other diseases classified elsewhere without behavioral disturbance: Secondary | ICD-10-CM | POA: Diagnosis not present

## 2015-07-16 DIAGNOSIS — I10 Essential (primary) hypertension: Secondary | ICD-10-CM | POA: Diagnosis not present

## 2015-07-16 DIAGNOSIS — Z7982 Long term (current) use of aspirin: Secondary | ICD-10-CM | POA: Diagnosis not present

## 2015-07-16 DIAGNOSIS — E7800 Pure hypercholesterolemia, unspecified: Secondary | ICD-10-CM | POA: Diagnosis not present

## 2015-07-16 DIAGNOSIS — J45909 Unspecified asthma, uncomplicated: Secondary | ICD-10-CM | POA: Insufficient documentation

## 2015-07-16 DIAGNOSIS — Z79899 Other long term (current) drug therapy: Secondary | ICD-10-CM | POA: Insufficient documentation

## 2015-07-16 DIAGNOSIS — G309 Alzheimer's disease, unspecified: Secondary | ICD-10-CM | POA: Insufficient documentation

## 2015-07-16 DIAGNOSIS — E860 Dehydration: Secondary | ICD-10-CM | POA: Insufficient documentation

## 2015-07-16 DIAGNOSIS — Z880 Allergy status to penicillin: Secondary | ICD-10-CM | POA: Insufficient documentation

## 2015-07-16 DIAGNOSIS — Z8673 Personal history of transient ischemic attack (TIA), and cerebral infarction without residual deficits: Secondary | ICD-10-CM | POA: Insufficient documentation

## 2015-07-16 DIAGNOSIS — Z8701 Personal history of pneumonia (recurrent): Secondary | ICD-10-CM | POA: Diagnosis not present

## 2015-07-16 LAB — COMPREHENSIVE METABOLIC PANEL
ALT: 16 U/L (ref 14–54)
AST: 23 U/L (ref 15–41)
Albumin: 3.7 g/dL (ref 3.5–5.0)
Alkaline Phosphatase: 62 U/L (ref 38–126)
Anion gap: 14 (ref 5–15)
BUN: 40 mg/dL — AB (ref 6–20)
CALCIUM: 9.4 mg/dL (ref 8.9–10.3)
CO2: 26 mmol/L (ref 22–32)
Chloride: 100 mmol/L — ABNORMAL LOW (ref 101–111)
Creatinine, Ser: 2.01 mg/dL — ABNORMAL HIGH (ref 0.44–1.00)
GFR, EST AFRICAN AMERICAN: 25 mL/min — AB (ref >60)
GFR, EST NON AFRICAN AMERICAN: 22 mL/min — AB (ref >60)
Glucose, Bld: 130 mg/dL — ABNORMAL HIGH (ref 65–99)
Potassium: 4.2 mmol/L (ref 3.5–5.1)
Sodium: 140 mmol/L (ref 135–145)
Total Bilirubin: 0.5 mg/dL (ref 0.3–1.2)
Total Protein: 6.8 g/dL (ref 6.5–8.1)

## 2015-07-16 LAB — URINALYSIS, ROUTINE W REFLEX MICROSCOPIC
Glucose, UA: NEGATIVE mg/dL
HGB URINE DIPSTICK: NEGATIVE
KETONES UR: NEGATIVE mg/dL
Leukocytes, UA: NEGATIVE
NITRITE: NEGATIVE
Protein, ur: 30 mg/dL — AB
Specific Gravity, Urine: 1.016 (ref 1.005–1.030)
pH: 5 (ref 5.0–8.0)

## 2015-07-16 LAB — URINE MICROSCOPIC-ADD ON
RBC / HPF: NONE SEEN RBC/hpf (ref 0–5)
Squamous Epithelial / LPF: NONE SEEN

## 2015-07-16 LAB — CBC
HCT: 39.6 % (ref 36.0–46.0)
HEMOGLOBIN: 13 g/dL (ref 12.0–15.0)
MCH: 30 pg (ref 26.0–34.0)
MCHC: 32.8 g/dL (ref 30.0–36.0)
MCV: 91.5 fL (ref 78.0–100.0)
Platelets: 230 10*3/uL (ref 150–400)
RBC: 4.33 MIL/uL (ref 3.87–5.11)
RDW: 13.5 % (ref 11.5–15.5)
WBC: 11.7 10*3/uL — ABNORMAL HIGH (ref 4.0–10.5)

## 2015-07-16 LAB — VALPROIC ACID LEVEL: VALPROIC ACID LVL: 25 ug/mL — AB (ref 50.0–100.0)

## 2015-07-16 LAB — TROPONIN I: Troponin I: <0.03 ng/mL (ref <0.031)

## 2015-07-16 MED ORDER — SODIUM CHLORIDE 0.9 % IV BOLUS (SEPSIS)
500.0000 mL | Freq: Once | INTRAVENOUS | Status: AC
  Administered 2015-07-16: 500 mL via INTRAVENOUS

## 2015-07-16 MED ORDER — ONDANSETRON 8 MG PO TBDP: 8.0000 mg | ORAL_TABLET | Freq: Three times a day (TID) | ORAL | Status: DC | PRN

## 2015-07-16 NOTE — ED Notes (Signed)
Radiology at bedside

## 2015-07-16 NOTE — ED Notes (Signed)
Pt daughter has prescription and discharge paperwork.

## 2015-07-16 NOTE — ED Notes (Addendum)
Delay in lab draw, staff giving peri care

## 2015-07-16 NOTE — ED Notes (Addendum)
[facility]- Per EMS, pt c/o emesis today. Staff reports that pt may have "aspirated today" after eating. Pt has brief LOC that was witnessed. It was reported that pt vomited, had eyes closed and would not respond to stimuli for approx 5 minutes. Pt is at baseline per the staff upon leaving the facility. Pt has hx of Alzheimer's. Pt in NAD. Pt is DNR

## 2015-07-16 NOTE — ED Provider Notes (Addendum)
CSN: [id number]     Arrival date & time 07/16/15  1831 History   First MD Initiated Contact with Patient 07/16/15 1843     Chief Complaint  Patient presents with  . Emesis  . Loss of Consciousness     (Consider location/radiation/quality/duration/timing/severity/associated sxs/prior Treatment) Patient is a 81 y.o. female presenting with vomiting and syncope. The history is provided by the patient and the EMS personnel. The history is limited by the condition of the patient.  Emesis Loss of Consciousness Associated symptoms: vomiting   Patient from ecf, hx advanced dementia. Ems indicates was called for episode of vomiting, and then briefly unresponsive. Ems notes on their arrival, and in route, pt lying in bed, in fetal position, not responding verbally to questions - ems indicates was told this posture and mental status is c/w patients baseline.  Pt not verbally responsive-  Level 5 caveat.  No report of trauma or fall. No report of fevers.  Patient has DNR on chart.      Past Medical History  Diagnosis Date  . Alzheimer disease   . Dementia   . Hypertension   . Hypercholesteremia   . GERD (gastroesophageal reflux disease)   . Hyponatremia   . Asthma   . Borderline diabetes   . Seizures     "one 5 years ago; none since as far as I know" (08/03/2013)  . Stroke     "said she had a mini stroke" (08/03/2013)  . Pneumonia 10/2012   Past Surgical History  Procedure Laterality Date  . Cataract extraction    . Total knee arthroplasty Bilateral   . Cataract extraction w/ intraocular lens  implant, bilateral Bilateral    No family history on file. Social History  Substance Use Topics  . Smoking status: Never Smoker   . Smokeless tobacco: Never Used  . Alcohol Use: No   OB History    No data available        Review of Systems  Unable to perform ROS: Dementia  Cardiovascular: Positive for syncope.  Gastrointestinal: Positive for vomiting.  level 5 caveat -  Pt not  verbally responsive/dementia     Allergies  Penicillins  Home Medications   Prior to Admission medications   Medication Sig Start Date End Date Taking? Authorizing Provider  amLODipine (NORVASC) 5 MG tablet Take 1 tablet (5 mg total) by mouth daily. 11/05/12   Renae Fickle, MD  aspirin EC 81 MG tablet Take 81 mg by mouth every other day.    Historical Provider, MD  cholecalciferol (VITAMIN D) 1000 UNITS tablet Take 1,000 Units by mouth daily.      Historical Provider, MD  divalproex (DEPAKOTE SPRINKLE) 125 MG capsule Take 250 mg by mouth 2 (two) times daily.     Historical Provider, MD  docusate (COLACE) 50 MG/5ML liquid Administer 20 ml by mouth twice daily for bowel aide    Historical Provider, MD  donepezil (ARICEPT) 5 MG tablet Take 5 mg by mouth at bedtime.    Historical Provider, MD  folic acid (FOLVITE) 1 MG tablet Take 1 mg by mouth every other day.      Historical Provider, MD  HYDROcodone-acetaminophen (NORCO/VICODIN) 5-325 MG per tablet Take 0.5 tablets by mouth every 4 (four) hours as needed for moderate pain. 05/26/14   Gilda Crease, MD  loperamide (IMODIUM A-D) 2 MG tablet Take 2 tablets =4 mg by mouth after each loose stool as needed    Historical Provider, MD  LORazepam (ATIVAN)  0.5 MG tablet Take one tablet by mouth twice daily for anxiety/agitation; Take one tablet by mouth every 4 hours as needed for anxiety 01/02/15   Tiffany L Reed, DO  losartan (COZAAR) 100 MG tablet Take 100 mg by mouth daily.      Historical Provider, MD  memantine (NAMENDA) 10 MG tablet Take 10 mg by mouth 2 (two) times daily.    Historical Provider, MD  metoprolol tartrate (LOPRESSOR) 25 MG tablet Take 25 mg by mouth 2 (two) times daily.    Historical Provider, MD  montelukast (SINGULAIR) 10 MG tablet Take 10 mg by mouth at bedtime.      Historical Provider, MD  Multiple Vitamin (MULTIVITAMIN WITH MINERALS) TABS tablet Take 1 tablet by mouth daily.    Historical Provider, MD  pantoprazole  (PROTONIX) 40 MG tablet Take 40 mg by mouth daily.      Historical Provider, MD  rOPINIRole (REQUIP) 0.25 MG tablet Take 0.25 mg by mouth at bedtime.    Historical Provider, MD  rosuvastatin (CRESTOR) 10 MG tablet Take 10 mg by mouth daily.      Historical Provider, MD   BP 101/60 mmHg  Pulse 62  Temp(Src) 98.1 F (36.7 C)  Resp 20  SpO2 99% Physical Exam  Constitutional: No distress.  Thin, content-appearing, lying on stretcher.   HENT:  Head: Atraumatic.  Mouth/Throat: Oropharynx is clear and moist.  Eyes: Conjunctivae are normal. Pupils are equal, round, and reactive to light. No scleral icterus.  Neck: Neck supple. No tracheal deviation present. No thyromegaly present.  No bruits. No stiffness or rigidity  Cardiovascular: Normal rate, regular rhythm, normal heart sounds and intact distal pulses.   Pulmonary/Chest: Effort normal and breath sounds normal. No respiratory distress.  Abdominal: Soft. Normal appearance and bowel sounds are normal. She exhibits no distension. There is no tenderness.  Genitourinary:  No cva tenderness  Musculoskeletal: She exhibits no edema or tenderness.  CTLS spine, non tender, aligned, no step off. rom bil ext without apparent pain. Distal pulses palp.   Neurological: She is alert.  Alert appearing, will move ext minimally when speaking w pt, does not follow commands.   Skin: Skin is warm and dry. No rash noted. She is not diaphoretic.  Psychiatric:  Lying quietly on stretcher, not verbally responsive.   Nursing note and vitals reviewed.   ED Course  Procedures (including critical care time) Labs Review  Results for orders placed or performed during the hospital encounter of 07/16/15  CBC  Result Value Ref Range   WBC 11.7 (H) 4.0 - 10.5 K/uL   RBC 4.33 3.87 - 5.11 MIL/uL   Hemoglobin 13.0 12.0 - 15.0 g/dL   HCT 16.1 09.6 - 04.5 %   MCV 91.5 78.0 - 100.0 fL   MCH 30.0 26.0 - 34.0 pg   MCHC 32.8 30.0 - 36.0 g/dL   RDW 40.9 81.1 - 91.4 %    Platelets 230 150 - 400 K/uL  Comprehensive metabolic panel  Result Value Ref Range   Sodium 140 135 - 145 mmol/L   Potassium 4.2 3.5 - 5.1 mmol/L   Chloride 100 (L) 101 - 111 mmol/L   CO2 26 22 - 32 mmol/L   Glucose, Bld 130 (H) 65 - 99 mg/dL   BUN 40 (H) 6 - 20 mg/dL   Creatinine, Ser 7.82 (H) 0.44 - 1.00 mg/dL   Calcium 9.4 8.9 - 95.6 mg/dL   Total Protein 6.8 6.5 - 8.1 g/dL   Albumin 3.7  3.5 - 5.0 g/dL   AST 23 15 - 41 U/L   ALT 16 14 - 54 U/L   Alkaline Phosphatase 62 38 - 126 U/L   Total Bilirubin 0.5 0.3 - 1.2 mg/dL   GFR calc non Af Amer 22 (L) >60 mL/min   GFR calc Af Amer 25 (L) >60 mL/min   Anion gap 14 5 - 15  Urinalysis, Routine w reflex microscopic (not at [facility])  Result Value Ref Range   Color, Urine YELLOW YELLOW   APPearance CLOUDY (A) CLEAR   Specific Gravity, Urine 1.016 1.005 - 1.030   pH 5.0 5.0 - 8.0   Glucose, UA NEGATIVE NEGATIVE mg/dL   Hgb urine dipstick NEGATIVE NEGATIVE   Bilirubin Urine SMALL (A) NEGATIVE   Ketones, ur NEGATIVE NEGATIVE mg/dL   Protein, ur 30 (A) NEGATIVE mg/dL   Nitrite NEGATIVE NEGATIVE   Leukocytes, UA NEGATIVE NEGATIVE  Troponin I  Result Value Ref Range   Troponin I <0.03 <0.031 ng/mL  Valproic acid level  Result Value Ref Range   Valproic Acid Lvl 25 (L) 50.0 - 100.0 ug/mL  Urine microscopic-add on  Result Value Ref Range   Squamous Epithelial / LPF NONE SEEN NONE SEEN   WBC, UA 0-5 0 - 5 WBC/hpf   RBC / HPF NONE SEEN 0 - 5 RBC/hpf   Bacteria, UA RARE (A) NONE SEEN   Casts HYALINE CASTS (A) NEGATIVE   Urine-Other MUCOUS PRESENT    Dg Chest Port 1 View  07/16/2015  CLINICAL DATA:  Vomited today with possible aspiration. EXAM: PORTABLE CHEST 1 VIEW COMPARISON:  05/26/2014. FINDINGS: Normal sized heart. Clear lungs. Bilateral shoulder degenerative changes. IMPRESSION: No acute abnormality. Electronically Signed   By: Beckie SaltsSteven  Reid M.D.   On: 07/16/2015 21:28       I have personally reviewed and evaluated these  images and lab results as part of my medical decision-making.   EKG Interpretation   Date/Time:  Sunday July 16 2015 19:37:25 EST Ventricular Rate:  96 PR Interval:  179 QRS Duration: 79 QT Interval:  388 QTC Calculation: 490 R Axis:   32 Text Interpretation:  Sinus rhythm Artifact in lead(s) I II III aVR aVL  aVF V1 V2 V3 V4 V5 V6 Confirmed by Denton LankSTEINL  MD, Kaisen Ackers ([id number]) on 07/16/2015  7:56:42 PM      MDM   Iv ns. Labs.   Iv ns bolus.  Reviewed nursing notes and prior charts for additional history.   ua neg for infection, cxr neg as well.  Pt afeb. abd soft non tender.  No recurrent nvd in ED.  Trial of po fluids.   Cr elevated above baseline on labs, although no real recent value to compare.  Iv ns bolus. Po fluids.  Pt remains afeb, abd soft nt, no recurrent emesis.   Family here, indicates pt appears at baseline mental status and functional ability. Pt is tolerating po fluids.   Pt currently appears stable for d/c.  rec close pcp f/u. Return precautions provided.      Cathren LaineKevin Mort Smelser, MD 07/16/15 2250

## 2015-07-16 NOTE — ED Notes (Signed)
[facility] called  

## 2015-07-16 NOTE — ED Notes (Signed)
Bed: WA17 Expected date:  Expected time:  Means of arrival:  Comments: EMS- emesis/poss aspiration

## 2015-07-16 NOTE — ED Notes (Signed)
Pt able to drink some apple juice with no difficulty.

## 2015-07-16 NOTE — Discharge Instructions (Signed)
It was our pleasure to provide your ER care today - we hope that you feel better.  You may take zofran as need for nausea.  Follow up with primary care doctor tomorrow for recheck if symptoms fail to improve/resolve.  Return to ER if worse, new symptoms, fevers, persistent vomiting, pain, change in mental status, other concern.      Nausea and Vomiting Nausea is a sick feeling that often comes before throwing up (vomiting). Vomiting is a reflex where stomach contents come out of your mouth. Vomiting can cause severe loss of body fluids (dehydration). Children and elderly adults can become dehydrated quickly, especially if they also have diarrhea. Nausea and vomiting are symptoms of a condition or disease. It is important to find the cause of your symptoms. CAUSES   Direct irritation of the stomach lining. This irritation can result from increased acid production (gastroesophageal reflux disease), infection, food poisoning, taking certain medicines (such as nonsteroidal anti-inflammatory drugs), alcohol use, or tobacco use.  Signals from the brain.These signals could be caused by a headache, heat exposure, an inner ear disturbance, increased pressure in the brain from injury, infection, a tumor, or a concussion, pain, emotional stimulus, or metabolic problems.  An obstruction in the gastrointestinal tract (bowel obstruction).  Illnesses such as diabetes, hepatitis, gallbladder problems, appendicitis, kidney problems, cancer, sepsis, atypical symptoms of a heart attack, or eating disorders.  Medical treatments such as chemotherapy and radiation.  Receiving medicine that makes you sleep (general anesthetic) during surgery. DIAGNOSIS Your caregiver may ask for tests to be done if the problems do not improve after a few days. Tests may also be done if symptoms are severe or if the reason for the nausea and vomiting is not clear. Tests may include:  Urine tests.  Blood tests.  Stool  tests.  Cultures (to look for evidence of infection).  X-rays or other imaging studies. Test results can help your caregiver make decisions about treatment or the need for additional tests. TREATMENT You need to stay well hydrated. Drink frequently but in small amounts.You may wish to drink water, sports drinks, clear broth, or eat frozen ice pops or gelatin dessert to help stay hydrated.When you eat, eating slowly may help prevent nausea.There are also some antinausea medicines that may help prevent nausea. HOME CARE INSTRUCTIONS   Take all medicine as directed by your caregiver.  If you do not have an appetite, do not force yourself to eat. However, you must continue to drink fluids.  If you have an appetite, eat a normal diet unless your caregiver tells you differently.  Eat a variety of complex carbohydrates (rice, wheat, potatoes, bread), lean meats, yogurt, fruits, and vegetables.  Avoid high-fat foods because they are more difficult to digest.  Drink enough water and fluids to keep your urine clear or pale yellow.  If you are dehydrated, ask your caregiver for specific rehydration instructions. Signs of dehydration may include:  Severe thirst.  Dry lips and mouth.  Dizziness.  Dark urine.  Decreasing urine frequency and amount.  Confusion.  Rapid breathing or pulse. SEEK IMMEDIATE MEDICAL CARE IF:   You have blood or brown flecks (like coffee grounds) in your vomit.  You have black or bloody stools.  You have a severe headache or stiff neck.  You are confused.  You have severe abdominal pain.  You have chest pain or trouble breathing.  You do not urinate at least once every 8 hours.  You develop cold or clammy skin.  You continue to vomit for longer than 24 to 48 hours.  You have a fever. MAKE SURE YOU:   Understand these instructions.  Will watch your condition.  Will get help right away if you are not doing well or get worse.   This  information is not intended to replace advice given to you by your health care provider. Make sure you discuss any questions you have with your health care provider.   Document Released: 07/29/2005 Document Revised: 10/21/2011 Document Reviewed: 12/26/2010 Elsevier Interactive Patient Education 2016 [employer] Inc[employer]    Dehydration, Adult Dehydration is a condition in which you do not have enough fluid or water in your body. It happens when you take in less fluid than you lose. Vital organs such as the kidneys, brain, and heart cannot function without a proper amount of fluids. Any loss of fluids from the body can cause dehydration.  Dehydration can range from mild to severe. This condition should be treated right away to help prevent it from becoming severe. CAUSES  This condition may be caused by:  Vomiting.  Diarrhea.  Excessive sweating, such as when exercising in hot or humid weather.  Not drinking enough fluid during strenuous exercise or during an illness.  Excessive urine output.  Fever.  Certain medicines. RISK FACTORS This condition is more likely to develop in:  People who are taking certain medicines that cause the body to lose excess fluid (diuretics).   People who have a chronic illness, such as diabetes, that may increase urination.  Older adults.   People who live at high altitudes.   People who participate in endurance sports.  SYMPTOMS  Mild Dehydration  Thirst.  Dry lips.  Slightly dry mouth.  Dry, warm skin. Moderate Dehydration  Very dry mouth.   Muscle cramps.   Dark urine and decreased urine production.   Decreased tear production.   Headache.   Light-headedness, especially when you stand up from a sitting position.  Severe Dehydration  Changes in skin.   Cold and clammy skin.   Skin does not spring back quickly when lightly pinched and released.   Changes in body fluids.   Extreme thirst.   No tears.   Not  able to sweat when body temperature is high, such as in hot weather.   Minimal urine production.   Changes in vital signs.   Rapid, weak pulse (more than 100 beats per minute when you are sitting still).   Rapid breathing.   Low blood pressure.   Other changes.   Sunken eyes.   Cold hands and feet.   Confusion.  Lethargy and difficulty being awakened.  Fainting (syncope).   Short-term weight loss.   Unconsciousness. DIAGNOSIS  This condition may be diagnosed based on your symptoms. You may also have tests to determine how severe your dehydration is. These tests may include:   Urine tests.   Blood tests.  TREATMENT  Treatment for this condition depends on the severity. Mild or moderate dehydration can often be treated at home. Treatment should be started right away. Do not wait until dehydration becomes severe. Severe dehydration needs to be treated at the hospital. Treatment for Mild Dehydration  Drinking plenty of water to replace the fluid you have lost.   Replacing minerals in your blood (electrolytes) that you may have lost.  Treatment for Moderate Dehydration  Consuming oral rehydration solution (ORS). Treatment for Severe Dehydration  Receiving fluid through an IV tube.   Receiving electrolyte solution through a  feeding tube that is passed through your nose and into your stomach (nasogastric tube or NG tube).  Correcting any abnormalities in electrolytes. HOME CARE INSTRUCTIONS   Drink enough fluid to keep your urine clear or pale yellow.   Drink water or fluid slowly by taking small sips. You can also try sucking on ice cubes.  Have food or beverages that contain electrolytes. Examples include bananas and sports drinks.  Take over-the-counter and prescription medicines only as told by your health care provider.   Prepare ORS according to the manufacturer's instructions. Take sips of ORS every 5 minutes until your urine returns to  normal.  If you have vomiting or diarrhea, continue to try to drink water, ORS, or both.   If you have diarrhea, avoid:   Beverages that contain caffeine.   Fruit juice.   Milk.   Carbonated soft drinks.  Do not take salt tablets. This can lead to the condition of having too much sodium in your body (hypernatremia).  SEEK MEDICAL CARE IF:  You cannot eat or drink without vomiting.  You have had moderate diarrhea during a period of more than 24 hours.  You have a fever. SEEK IMMEDIATE MEDICAL CARE IF:   You have extreme thirst.  You have severe diarrhea.  You have not urinated in 6-8 hours, or you have urinated only a small amount of very dark urine.  You have shriveled skin.  You are dizzy, confused, or both.   This information is not intended to replace advice given to you by your health care provider. Make sure you discuss any questions you have with your health care provider.   Document Released: 07/29/2005 Document Revised: 04/19/2015 Document Reviewed: 12/14/2014 Elsevier Interactive Patient Education [employer]

## 2015-08-09 LAB — CBC AND DIFFERENTIAL
HEMATOCRIT: 35 % — AB (ref 36–46)
HEMOGLOBIN: 11.2 g/dL — AB (ref 12.0–16.0)
PLATELETS: 217 10*3/uL (ref 150–399)
WBC: 8.7 10*3/mL

## 2015-08-09 LAB — BASIC METABOLIC PANEL
BUN: 31 mg/dL — AB (ref 4–21)
Creatinine: 1.5 mg/dL — AB (ref 0.5–1.1)
Glucose: 63 mg/dL
Sodium: 136 mmol/L — AB (ref 137–147)

## 2015-09-11 ENCOUNTER — Non-Acute Institutional Stay (SKILLED_NURSING_FACILITY): Payer: Medicare HMO | Admitting: Adult Health

## 2015-09-11 ENCOUNTER — Encounter: Payer: Self-pay | Admitting: Adult Health

## 2015-09-11 DIAGNOSIS — I10 Essential (primary) hypertension: Secondary | ICD-10-CM

## 2015-09-11 DIAGNOSIS — R569 Unspecified convulsions: Secondary | ICD-10-CM

## 2015-09-11 DIAGNOSIS — N183 Chronic kidney disease, stage 3 (moderate): Secondary | ICD-10-CM

## 2015-09-11 DIAGNOSIS — IMO0002 Reserved for concepts with insufficient information to code with codable children: Secondary | ICD-10-CM

## 2015-09-11 DIAGNOSIS — E559 Vitamin D deficiency, unspecified: Secondary | ICD-10-CM | POA: Diagnosis not present

## 2015-09-11 DIAGNOSIS — M1710 Unilateral primary osteoarthritis, unspecified knee: Secondary | ICD-10-CM

## 2015-09-11 DIAGNOSIS — M179 Osteoarthritis of knee, unspecified: Secondary | ICD-10-CM

## 2015-09-11 DIAGNOSIS — G309 Alzheimer's disease, unspecified: Secondary | ICD-10-CM

## 2015-09-11 DIAGNOSIS — K5900 Constipation, unspecified: Secondary | ICD-10-CM

## 2015-09-11 DIAGNOSIS — K219 Gastro-esophageal reflux disease without esophagitis: Secondary | ICD-10-CM

## 2015-09-11 DIAGNOSIS — D638 Anemia in other chronic diseases classified elsewhere: Secondary | ICD-10-CM

## 2015-09-11 DIAGNOSIS — F0280 Dementia in other diseases classified elsewhere without behavioral disturbance: Secondary | ICD-10-CM

## 2015-09-11 DIAGNOSIS — F419 Anxiety disorder, unspecified: Secondary | ICD-10-CM | POA: Diagnosis not present

## 2015-09-11 DIAGNOSIS — E785 Hyperlipidemia, unspecified: Secondary | ICD-10-CM

## 2015-09-11 DIAGNOSIS — G2581 Restless legs syndrome: Secondary | ICD-10-CM

## 2015-09-11 NOTE — Progress Notes (Signed)
Patient ID: [name], female   DOB: 09-05-1930, 82 y.o.   MRN: [id number]     DATE:       09/11/15  Facility:  Nursing Home Location:  [facility]  Nursing Home Room Number: 302-2 LEVEL OF CARE:  SNF (31)   Chief Complaint  Patient presents with  . Medical Management of Chronic Issues    HISTORY OF PRESENT ILLNESS:  This is an 82 year old female who is being seen for a routine visit. She is a long-term care resident and is on hospice/comfort care. She is mostly nonverbal. Sacral pressure ulcer has healed. Alzheimer's disease is advance and currently on Namenda and Aricept. She continues to have comfort care/hospice.   PAST MEDICAL HISTORY:  Past Medical History  Diagnosis Date  . Alzheimer disease   . Dementia   . Hypertension   . Hypercholesteremia   . GERD (gastroesophageal reflux disease)   . Hyponatremia   . Asthma   . Borderline diabetes   . Seizures (HCC)     "one 5 years ago; none since as far as I know" (08/03/2013)  . Stroke [facility])     "said she had a mini stroke" (08/03/2013)  . Pneumonia 10/2012  . CKD (chronic kidney disease)   . Osteoarthrosis   . Decubitus ulcer     CURRENT MEDICATIONS: Reviewed    Medication List       This list is accurate as of: 09/11/15 11:59 PM.  Always use your most recent med list.               amLODipine 5 MG tablet  Commonly known as:  NORVASC  Take 5 mg by mouth daily.     aspirin EC 81 MG tablet  Take 81 mg by mouth every other day.     CALMOSEPTINE 0.44-20.625 % Oint  Generic drug:  Menthol-Zinc Oxide  Apply topically. Apply after incontinent episodes and ADLs     COLACE PO  Take 20 mLs by mouth 2 (two) times daily.     DECUBI-VITE Caps  Take 1 capsule by mouth daily.     divalproex 125 MG capsule  Commonly known as:  DEPAKOTE SPRINKLE  Take two tablets =  by mouth twice daily for seizures. DO NOT CRUSH OR CHEW     donepezil 5 MG tablet  Commonly known as:  ARICEPT  Take 5 mg  by mouth at bedtime.     folic acid 1 MG tablet  Commonly known as:  FOLVITE  Take 1 mg by mouth every other day.     HYDROcodone-acetaminophen 5-325 MG tablet  Commonly known as:  NORCO/VICODIN  Take 0.5 tablets by mouth every 4 (four) hours as needed for moderate pain DO NOT EXCEED 4GM/24 HOURS).     loperamide 2 MG tablet  Commonly known as:  IMODIUM A-D  Take 4 mg by mouth. Take 2 caplets by mouth to equal 4 mg after each loose stool prn. DO NOT EXCEED / 24 HOUR PERIOD. May use house stock.     LORazepam 0.5 MG tablet  Commonly known as:  ATIVAN  Take 0.5 mg by mouth 2 (two) times daily.     losartan 100 MG tablet  Commonly known as:  COZAAR  Take 100 mg by mouth daily.     memantine 10 MG tablet  Commonly known as:  NAMENDA  Take 10 mg by mouth 2 (two) times daily.     metoprolol tartrate 25 MG tablet  Commonly known as:  LOPRESSOR  Take 25 mg by mouth 2 (two) times daily.     montelukast 10 MG tablet  Commonly known as:  SINGULAIR  Take 10 mg by mouth at bedtime.     NUTRITIONAL SUPPLEMENTS PO  Take 90 mLs by mouth 3 (three) times daily.     pantoprazole 20 MG tablet  Commonly known as:  PROTONIX  Take 20 mg by mouth daily.     PROCEL PO  Take 1 scoop by mouth 2 (two) times daily.     rOPINIRole 0.25 MG tablet  Commonly known as:  REQUIP  Take 0.25 mg by mouth at bedtime.     rosuvastatin 10 MG tablet  Commonly known as:  CRESTOR  Take 10 mg by mouth daily.     UNABLE TO FIND  Med Name: MedPass 90 mL TID     VITAMIN D-3 PO  Take 1,000 Units by mouth daily.          Allergies  Allergen Reactions  . Penicillins Other (See Comments)    Per mar     REVIEW OF SYSTEMS:  Unable to obtain due to advanced dementia  PHYSICAL EXAMINATION  GENERAL: no acute distress, normal body habitus NECK: supple, trachea midline, no neck masses, no thyroid tenderness, no thyromegaly LYMPHATICS: no LAN in the neck, no supraclavicular LAN RESPIRATORY:  breathing is even & unlabored, BS CTAB CARDIAC: RRR, no murmur,no extra heart sounds, no edema GI: abdomen soft, normal BS, no masses, no tenderness, no hepatomegaly, no splenomegaly EXTREMITIES: right hand is contracted; limited ROM on bilateral shoulders; generalized weakness on BLE PSYCHIATRIC: affect & behavior appropriate  LABS/RADIOLOGY: Labs reviewed: 08/08/16  WBC 8.7 hemoglobin 11.2 hematocrit 34.9 MCV 91.4 platelet 217 sodium 136  K5.0 glucose 23 BUN 31 creatinine 1.51 calcium 8.8  valproic acid level 53.8 Basic Metabolic Panel:  Recent Labs  30/86/57 04/25/15 07/16/15 1953  NA 142 142 140  K 4.4 4.7 4.2  CL  --   --  100*  CO2  --   --  26  GLUCOSE  --   --  130*  BUN 21 30* 40*  CREATININE 0.8 1.1 2.01*  CALCIUM  --   --  9.4   Liver Function Tests:  Recent Labs  11/22/14 04/25/15 07/16/15 1953  AST 12* 12* 23  ALT ALKPHOS 49 57 62  BILITOT  --   --  0.5  PROT  --   --  6.8  ALBUMIN  --   --  3.7   CBC:  Recent Labs  11/22/14 04/25/15 07/16/15 1953  WBC 6.0 7.7 11.7*  NEUTROABS  --  4  --   HGB 11.3* 12.3 13.0  HCT 34* 39 39.6  MCV  --   --  91.5  PLT 164 188 230   Lipid Panel:  Recent Labs  04/25/15  HDL 56   Cardiac Enzymes:  Recent Labs  07/16/15 1953  TROPONINI <0.03      ASSESSMENT/PLAN:  Vitamin D deficiency - continue vitamin D3 1000 units 1 tab by mouth daily  Seizure - continue Depakote 250 mg by mouth twice a day; check Valproic acid level  Chronic kidney disease, stage III - creatinine 1.51; check CMP  Hypertension - well-controlled; continue Norvasc 5 mg by mouth daily, Cozaar 100 mg by mouth daily and Lopressor 25 mg by mouth twice a day   Hyperlipidemia - continue Crestor 10 mg by mouth daily  GERD -  continue Protonix 40 mg by mouth daily  Anxiety - mood is stable; continue Ativan 0.5 mg 1 tab by mouth twice a day and Ativan 0.5 mg by mouth every 4 hours when necessary  Constipation - continue Colace 200  mg/20 mL by mouth twice a day  Alzheimer's disease - advanced; continue Namenda 10 mg by mouth twice a day and Aricept 5 mg by mouth daily  Restless leg syndrome - stable; continue Requip 0.25 mg by mouth daily at bedtime  Osteoarthrosis - continue Norco 5/325 mg 1/2 tab by mouth every 4 hours when necessary  Anemia - hgb 11.2; check CBC       Goals of care:  Long-term care     [facility], NP [facility] [phone]

## 2015-09-22 LAB — CBC AND DIFFERENTIAL
HEMATOCRIT: 38 % (ref 36–46)
HEMOGLOBIN: 12.5 g/dL (ref 12.0–16.0)
PLATELETS: 68 10*3/uL — AB (ref 150–399)
WBC: 7.4 10*3/mL

## 2015-09-22 LAB — BASIC METABOLIC PANEL
BUN: 18 mg/dL (ref 4–21)
Creatinine: 0.8 mg/dL (ref 0.5–1.1)
Glucose: 68 mg/dL
Sodium: 142 mmol/L (ref 137–147)

## 2015-09-22 LAB — HEPATIC FUNCTION PANEL
ALT: 9 U/L (ref 7–35)
Alkaline Phosphatase: 57 U/L (ref 25–125)
BILIRUBIN, TOTAL: 0.5 mg/dL

## 2015-09-27 ENCOUNTER — Other Ambulatory Visit: Payer: Self-pay | Admitting: *Deleted

## 2015-09-27 MED ORDER — LORAZEPAM 0.5 MG PO TABS: ORAL_TABLET | ORAL | Status: DC

## 2015-09-27 NOTE — Telephone Encounter (Signed)
[facility] 

## 2015-10-03 ENCOUNTER — Non-Acute Institutional Stay (SKILLED_NURSING_FACILITY): Payer: Medicare HMO | Admitting: Adult Health

## 2015-10-03 ENCOUNTER — Encounter: Payer: Self-pay | Admitting: Adult Health

## 2015-10-03 DIAGNOSIS — I10 Essential (primary) hypertension: Secondary | ICD-10-CM

## 2015-10-03 DIAGNOSIS — IMO0002 Reserved for concepts with insufficient information to code with codable children: Secondary | ICD-10-CM

## 2015-10-03 DIAGNOSIS — E785 Hyperlipidemia, unspecified: Secondary | ICD-10-CM

## 2015-10-03 DIAGNOSIS — M179 Osteoarthritis of knee, unspecified: Secondary | ICD-10-CM | POA: Diagnosis not present

## 2015-10-03 DIAGNOSIS — F0280 Dementia in other diseases classified elsewhere without behavioral disturbance: Secondary | ICD-10-CM

## 2015-10-03 DIAGNOSIS — E559 Vitamin D deficiency, unspecified: Secondary | ICD-10-CM | POA: Diagnosis not present

## 2015-10-03 DIAGNOSIS — G2581 Restless legs syndrome: Secondary | ICD-10-CM | POA: Diagnosis not present

## 2015-10-03 DIAGNOSIS — R569 Unspecified convulsions: Secondary | ICD-10-CM | POA: Diagnosis not present

## 2015-10-03 DIAGNOSIS — K219 Gastro-esophageal reflux disease without esophagitis: Secondary | ICD-10-CM

## 2015-10-03 DIAGNOSIS — K5900 Constipation, unspecified: Secondary | ICD-10-CM

## 2015-10-03 DIAGNOSIS — F419 Anxiety disorder, unspecified: Secondary | ICD-10-CM | POA: Diagnosis not present

## 2015-10-03 DIAGNOSIS — M1710 Unilateral primary osteoarthritis, unspecified knee: Secondary | ICD-10-CM

## 2015-10-03 DIAGNOSIS — G309 Alzheimer's disease, unspecified: Secondary | ICD-10-CM

## 2015-10-03 NOTE — Progress Notes (Deleted)
Patient ID: [name], female   DOB: Sep 28, 1930, 82 y.o.   MRN: [id number] Patient ID: [name], female   DOB: 11-05-1930, 82 y.o.   MRN: [id number]     DATE:       09/11/15  Facility:  Nursing Home Location:  [facility]  Nursing Home Room Number: [id number] LEVEL OF CARE:  SNF (31)   Chief Complaint  Patient presents with  . Medical Management of Chronic Issues    HISTORY OF PRESENT ILLNESS:  This is an 82 year old female who is being seen for a routine visit. She is a long-term care resident and is on hospice/comfort care. She is mostly nonverbal. Sacral pressure ulcer has healed. Alzheimer's disease is advance and currently on Namenda and Aricept. She continues to have comfort care/hospice.   PAST MEDICAL HISTORY:  Past Medical History  Diagnosis Date  . Alzheimer disease   . Dementia   . Hypertension   . Hypercholesteremia   . GERD (gastroesophageal reflux disease)   . Hyponatremia   . Asthma   . Borderline diabetes   . Seizures (HCC)     "one 5 years ago; none since as far as I know" (08/03/2013)  . Stroke [facility])     "said she had a mini stroke" (08/03/2013)  . Pneumonia 10/2012  . CKD (chronic kidney disease)   . Osteoarthrosis   . Decubitus ulcer     CURRENT MEDICATIONS: Reviewed    Medication List       This list is accurate as of: 09/11/15 11:59 PM.  Always use your most recent med list.               amLODipine 5 MG tablet  Commonly known as:  NORVASC  Take 5 mg by mouth daily.     aspirin EC 81 MG tablet  Take 81 mg by mouth every other day.     CALMOSEPTINE 0.44-20.625 % Oint  Generic drug:  Menthol-Zinc Oxide  Apply topically. Apply after incontinent episodes and ADLs     COLACE PO  Take 20 mLs by mouth 2 (two) times daily.     DECUBI-VITE Caps  Take 1 capsule by mouth daily.     divalproex 125 MG capsule  Commonly known as:  DEPAKOTE SPRINKLE  Take two tablets =  by mouth twice daily for seizures. DO NOT  CRUSH OR CHEW     donepezil 5 MG tablet  Commonly known as:  ARICEPT  Take 5 mg by mouth at bedtime.     folic acid 1 MG tablet  Commonly known as:  FOLVITE  Take 1 mg by mouth every other day.     HYDROcodone-acetaminophen 5-325 MG tablet  Commonly known as:  NORCO/VICODIN  Take 0.5 tablets by mouth every 4 (four) hours as needed for moderate pain DO NOT EXCEED 4GM/24 HOURS).     loperamide 2 MG tablet  Commonly known as:  IMODIUM A-D  Take 4 mg by mouth. Take 2 caplets by mouth to equal 4 mg after each loose stool prn. DO NOT EXCEED / 24 HOUR PERIOD. May use house stock.     LORazepam 0.5 MG tablet  Commonly known as:  ATIVAN  Take 0.5 mg by mouth 2 (two) times daily.     losartan 100 MG tablet  Commonly known as:  COZAAR  Take 100 mg by mouth daily.     memantine 10 MG tablet  Commonly known as:  NAMENDA  Take 10 mg by  mouth 2 (two) times daily.     metoprolol tartrate 25 MG tablet  Commonly known as:  LOPRESSOR  Take 25 mg by mouth 2 (two) times daily.     montelukast 10 MG tablet  Commonly known as:  SINGULAIR  Take 10 mg by mouth at bedtime.     NUTRITIONAL SUPPLEMENTS PO  Take 90 mLs by mouth 3 (three) times daily.     pantoprazole 20 MG tablet  Commonly known as:  PROTONIX  Take 20 mg by mouth daily.     PROCEL PO  Take 1 scoop by mouth 2 (two) times daily.     rOPINIRole 0.25 MG tablet  Commonly known as:  REQUIP  Take 0.25 mg by mouth at bedtime.     rosuvastatin 10 MG tablet  Commonly known as:  CRESTOR  Take 10 mg by mouth daily.     UNABLE TO FIND  Med Name: MedPass 90 mL TID     VITAMIN D-3 PO  Take 1,000 Units by mouth daily.          Allergies  Allergen Reactions  . Penicillins Other (See Comments)    Per mar     REVIEW OF SYSTEMS:  Unable to obtain due to advanced dementia  PHYSICAL EXAMINATION  GENERAL: no acute distress, normal body habitus NECK: supple, trachea midline, no neck masses, no thyroid tenderness, no  thyromegaly LYMPHATICS: no LAN in the neck, no supraclavicular LAN RESPIRATORY: breathing is even & unlabored, BS CTAB CARDIAC: RRR, no murmur,no extra heart sounds, no edema GI: abdomen soft, normal BS, no masses, no tenderness, no hepatomegaly, no splenomegaly EXTREMITIES: right hand is contracted; limited ROM on bilateral shoulders; generalized weakness on BLE PSYCHIATRIC: affect & behavior appropriate  LABS/RADIOLOGY: Labs reviewed: 08/08/16  WBC 8.7 hemoglobin 11.2 hematocrit 34.9 MCV 91.4 platelet 217 sodium 136  K5.0 glucose 23 BUN 31 creatinine 1.51 calcium 8.8  valproic acid level 53.8 Basic Metabolic Panel:  Recent Labs  08/65/78 04/25/15 07/16/15 1953 08/09/15 09/22/15  NA 142 142 140 136* 142  K 4.4 4.7 4.2  --   --   CL  --   --  100*  --   --   CO2  --   --  26  --   --   GLUCOSE  --   --  130*  --   --   BUN 21 30* 40* 31* 18  CREATININE 0.8 1.1 2.01* 1.5* 0.8  CALCIUM  --   --  9.4  --   --    Liver Function Tests:  Recent Labs  11/22/14 04/25/15 07/16/15 1953 09/22/15  AST 12* 12* 23  --   ALT ALKPHOS 49 57 62 57  BILITOT  --   --  0.5  --   PROT  --   --  6.8  --   ALBUMIN  --   --  3.7  --    CBC:  Recent Labs  04/25/15 07/16/15 1953 08/09/15 09/22/15  WBC 7.7 11.7* 8.7 7.4  NEUTROABS 4  --   --   --   HGB 12.3 13.0 11.2* 12.5  HCT 39 39.6 35* 38  MCV  --  91.5  --   --   PLT 188 230 217 68*   Lipid Panel:  Recent Labs  04/25/15  HDL 56   Cardiac Enzymes:  Recent Labs  07/16/15 1953  TROPONINI <0.03      ASSESSMENT/PLAN:  Vitamin D  deficiency - continue vitamin D3 1000 units 1 tab by mouth daily  Seizure - continue Depakote 250 mg by mouth twice a day; check Valproic acid level  Chronic kidney disease, stage III - creatinine 1.51; check CMP  Hypertension - well-controlled; continue Norvasc 5 mg by mouth daily, Cozaar 100 mg by mouth daily and Lopressor 25 mg by mouth twice a day   Hyperlipidemia - continue  Crestor 10 mg by mouth daily  GERD - continue Protonix 40 mg by mouth daily  Anxiety - mood is stable; continue Ativan 0.5 mg 1 tab by mouth twice a day and Ativan 0.5 mg by mouth every 4 hours when necessary  Constipation - continue Colace 200 mg/20 mL by mouth twice a day  Alzheimer's disease - advanced; continue Namenda 10 mg by mouth twice a day and Aricept 5 mg by mouth daily  Restless leg syndrome - stable; continue Requip 0.25 mg by mouth daily at bedtime  Osteoarthrosis - continue Norco 5/325 mg 1/2 tab by mouth every 4 hours when necessary  Anemia - hgb 11.2; check CBC       Goals of care:  Long-term care     [facility], NP [facility] [phone]

## 2015-10-03 NOTE — Progress Notes (Deleted)
Patient ID: [name], female   DOB: August 13, 1930, 82 y.o.   MRN: [id number]

## 2015-10-03 NOTE — Progress Notes (Signed)
Patient ID: [name], female   DOB: [birth year], 82 y.o.   MRN: [id number]    DATE:  10/03/2015   MRN:  [id number]  BIRTHDAY: [birth year]  Facility:  Nursing Home Location:  [facility]  Nursing Home Room Number: 302-2  LEVEL OF CARE:  SNF [phone])  Contact Information    Name Relation Home Work Hurt Spouse [phone]  [phone]   [name] Daughter [phone]       Code Status History    Date Active Date Inactive Code Status Order ID Comments User Context   08/03/2013  2:59 PM 08/06/2013  4:52 PM DNR [id number]  Esperanza Sheets, MD ED   08/03/2013  2:09 PM 08/03/2013  2:59 PM DNR [id number]  Esperanza Sheets, MD ED       Chief Complaint  Patient presents with  . Medical Management of Chronic Issues    HISTORY OF PRESENT ILLNESS:  This is an 82 year old female who is being seen for a routine visit. She is comfort care/hospice. Last lab did not show any result for K due to hemolysis. She appears to be comfortable. Her hypertension is well-controlled and continues to take Norvasc 5 mg daily, lopressor 25 mg BID and Cozaar 100 mg daily. Singulair was recently discontinued.   PAST MEDICAL HISTORY:  Past Medical History  Diagnosis Date  . Alzheimer disease   . Dementia   . Hypertension   . Hypercholesteremia   . GERD (gastroesophageal reflux disease)   . Hyponatremia   . Asthma   . Borderline diabetes   . Seizures (HCC)     "one 5 years ago; none since as far as I know" (08/03/2013)  . Stroke [facility])     "said she had a mini stroke" (08/03/2013)  . Pneumonia 10/2012  . CKD (chronic kidney disease)   . Osteoarthrosis   . Decubitus ulcer      CURRENT MEDICATIONS: Reviewed  Patient's Medications  New Prescriptions   No medications on file  Previous Medications   AMLODIPINE (NORVASC) 5 MG TABLET    Take 5 mg by mouth daily.   ASPIRIN EC 81 MG TABLET    Take 81 mg by mouth every other day.   CHOLECALCIFEROL (VITAMIN D-3 PO)    Take 1,000 Units by mouth daily.   DIVALPROEX (DEPAKOTE SPRINKLE) 125 MG CAPSULE    Take two tablets =  by mouth twice daily for seizures. DO NOT CRUSH OR CHEW   DOCUSATE SODIUM (COLACE PO)    Take 20 mLs by mouth 2 (two) times daily.   DONEPEZIL (ARICEPT) 5 MG TABLET    Take 5 mg by mouth at bedtime.   FOLIC ACID (FOLVITE) 1 MG TABLET    Take 1 mg by mouth every other day.     HYDROCODONE-ACETAMINOPHEN (NORCO/VICODIN) 5-325 MG TABLET    Take 0.5 tablets by mouth every 4 (four) hours as needed for moderate pain (DO NOT EXCEED 4GM/24 HOURS).    LORAZEPAM (ATIVAN) 0.5 MG TABLET    Take 0.5 mg by mouth 2 (two) times daily.   LORAZEPAM (ATIVAN) 0.5 MG TABLET    Take 0.5 mg by mouth every 4 (four) hours as needed for anxiety.   LOSARTAN (COZAAR) 100 MG TABLET    Take 100 mg by mouth daily.     MEMANTINE (NAMENDA) 10 MG TABLET    Take 10 mg by mouth 2 (two) times daily.   MENTHOL-ZINC OXIDE (CALMOSEPTINE) 0.44-20.6 % OINT  Apply topically. Apply to sacral region after each incontinent episode   METOPROLOL TARTRATE (LOPRESSOR) 25 MG TABLET    Take 25 mg by mouth 2 (two) times daily.   MULTIPLE VITAMINS-MINERALS (DECUBI-VITE) CAPS    Take 1 capsule by mouth daily.   PANTOPRAZOLE (PROTONIX) 20 MG TABLET    Take 20 mg by mouth daily.   PROTEIN (PROCEL PO)    Take 1 scoop by mouth 2 (two) times daily.   ROPINIROLE (REQUIP) 0.25 MG TABLET    Take 0.25 mg by mouth at bedtime.   ROSUVASTATIN (CRESTOR) 10 MG TABLET    Take 10 mg by mouth daily.     UNABLE TO FIND    Med Name: MedPass 90 mL TID  Modified Medications   No medications on file  Discontinued Medications   LOPERAMIDE (IMODIUM A-D) 2 MG TABLET    Take 4 mg by mouth. Take 2 caplets by mouth to equal 4 mg after each loose stool prn. DO NOT EXCEED / 24 HOUR PERIOD. May use house stock.   LORAZEPAM (ATIVAN) 0.5 MG TABLET    Take one tablet by mouth every 4 hours as needed for anxiety   MENTHOL-ZINC OXIDE  (CALMOSEPTINE) 0.44-20.625 % OINT    Apply topically. Apply after incontinent episodes and ADLs   MONTELUKAST (SINGULAIR) 10 MG TABLET    Take 10 mg by mouth at bedtime.   NUTRITIONAL SUPPLEMENTS PO    Take 90 mLs by mouth 3 (three) times daily.   PANTOPRAZOLE (PROTONIX) 20 MG TABLET    Take 20 mg by mouth daily.     Allergies  Allergen Reactions  . Penicillins Other (See Comments)    Per mar     REVIEW OF SYSTEMS:  Unable to obtain due to advanced Dementia   PHYSICAL EXAMINATION  GENERAL APPEARANCE: Well nourished. In no acute distress. Normal body habitus SKIN:  Skin is warm and dry.  HEAD: Normal in size and contour. No evidence of trauma EARS: Pinnae are normal. Patient hears normal voice tunes of the examiner MOUTH and THROAT: Lips are without lesions. Oral mucosa is moist and without lesions. Tongue is normal in shape, size, and color and without lesions NECK: supple, trachea midline, no neck masses, no thyroid tenderness, no thyromegaly LYMPHATICS: no LAN in the neck, no supraclavicular LAN RESPIRATORY: breathing is even & unlabored, BS CTAB CARDIAC: RRR, no murmur,no extra heart sounds, no edema GI: abdomen soft, normal BS, no masses, no tenderness, no hepatomegaly, no splenomegaly EXTREMITIES:  Right hand is contracted; limited ROM on bilateral shoulders; generalized weakness on BLE PSYCHIATRIC:  Disoriented to time and place. Affect and behavior are appropriate  LABS/RADIOLOGY: Labs reviewed: Basic Metabolic Panel:  Recent Labs  40/98/11 04/25/15 07/16/15 1953 08/09/15 09/22/15  NA 142 142 140 136* 142  K 4.4 4.7 4.2  --   --   CL  --   --  100*  --   --   CO2  --   --  26  --   --   GLUCOSE  --   --  130*  --   --   BUN 21 30* 40* 31* 18  CREATININE 0.8 1.1 2.01* 1.5* 0.8  CALCIUM  --   --  9.4  --   --    Liver Function Tests:  Recent Labs  11/22/14 04/25/15 07/16/15 1953 09/22/15  AST 12* 12* 23  --   ALT ALKPHOS 49 57 62 57  BILITOT  --    --  0.5  --   PROT  --   --  6.8  --   ALBUMIN  --   --  3.7  --    CBC:  Recent Labs  04/25/15 07/16/15 1953 08/09/15 09/22/15  WBC 7.7 11.7* 8.7 7.4  NEUTROABS 4  --   --   --   HGB 12.3 13.0 11.2* 12.5  HCT 39 39.6 35* 38  MCV  --  91.5  --   --   PLT 188 230 217 68*   Lipid Panel:  Recent Labs  04/25/15  HDL 56   Cardiac Enzymes:  Recent Labs  07/16/15 1953  TROPONINI <0.03     ASSESSMENT/PLAN:  Seizure - continue Depakote 250 mg by mouth twice a day  Hypertension - well-controlled; continue Norvasc 5 mg by mouth daily, Cozaar 100 mg by mouth daily and Lopressor 25 mg by mouth twice a day   Hyperlipidemia - continue Crestor 10 mg by mouth daily  GERD - continue Protonix 40 mg by mouth daily  Anxiety - mood is stable; continue Ativan 0.5 mg 1 tab by mouth twice a day and Ativan 0.5 mg by mouth every 4 hours when necessary  Vitamin D deficiency - continue vitamin D3 1000 units 1 tab by mouth daily  Constipation - continue Colace 200 mg/20 mL by mouth twice a day  Alzheimer's disease - advanced; continue Namenda 10 mg by mouth twice a day and Aricept 5 mg by mouth daily  Restless leg syndrome - stable; continue Requip 0.25 mg by mouth daily at bedtime  Osteoarthrosis - continue Norco 5/325 mg 1/2 tab by mouth every 4 hours when necessary      Goals of care:  Long-term care/Hospice care   [facility], NP [facility] [phone]

## 2015-10-05 LAB — BASIC METABOLIC PANEL: POTASSIUM: 4.1 mmol/L (ref 3.4–5.3)

## 2015-10-23 ENCOUNTER — Non-Acute Institutional Stay (SKILLED_NURSING_FACILITY): Payer: Medicare HMO | Admitting: Internal Medicine

## 2015-10-23 ENCOUNTER — Encounter: Payer: Self-pay | Admitting: Internal Medicine

## 2015-10-23 DIAGNOSIS — E559 Vitamin D deficiency, unspecified: Secondary | ICD-10-CM

## 2015-10-23 DIAGNOSIS — M24549 Contracture, unspecified hand: Secondary | ICD-10-CM

## 2015-10-23 DIAGNOSIS — I10 Essential (primary) hypertension: Secondary | ICD-10-CM

## 2015-10-23 DIAGNOSIS — E785 Hyperlipidemia, unspecified: Secondary | ICD-10-CM

## 2015-10-23 DIAGNOSIS — G308 Other Alzheimer's disease: Secondary | ICD-10-CM | POA: Diagnosis not present

## 2015-10-23 DIAGNOSIS — K5900 Constipation, unspecified: Secondary | ICD-10-CM | POA: Diagnosis not present

## 2015-10-23 DIAGNOSIS — F0280 Dementia in other diseases classified elsewhere without behavioral disturbance: Secondary | ICD-10-CM | POA: Diagnosis not present

## 2015-10-23 DIAGNOSIS — K5909 Other constipation: Secondary | ICD-10-CM

## 2015-10-23 NOTE — Progress Notes (Signed)
Patient ID: [name], female   DOB: [birth year], 82 y.o.   MRN: [id number]     [facility]   PCP: Florentina Jenny, MD  Code Status: DNR  Allergies  Allergen Reactions  . Penicillins Other (See Comments)    Per mar    Chief Complaint  Patient presents with  . Medical Management of Chronic Issues    Routine Visit     HPI:  82 year old patient is seen for routine visit. She is under total care. She does not participate in HPI and ROS. She has been at her baseline per nursing staff. She requires assistance with feeding. No fall reported. No new skin concern reported. She requires 1 person assist with transfers. She has regular bowel movement.   Review of Systems:  Unable to obtain with her dementia   Past Medical History  Diagnosis Date  . Alzheimer disease   . Dementia   . Hypertension   . Hypercholesteremia   . GERD (gastroesophageal reflux disease)   . Hyponatremia   . Asthma   . Borderline diabetes   . Seizures (HCC)     "one 5 years ago; none since as far as I know" (08/03/2013)  . Stroke [facility])     "said she had a mini stroke" (08/03/2013)  . Pneumonia 10/2012  . CKD (chronic kidney disease)   . Osteoarthrosis   . Decubitus ulcer     Medications: Patient's Medications  New Prescriptions   No medications on file  Previous Medications   AMLODIPINE (NORVASC) 5 MG TABLET    Take 5 mg by mouth daily.   ASPIRIN 81 MG CHEWABLE TABLET    Chew 81 mg by mouth every other day.   CHOLECALCIFEROL (VITAMIN D-3 PO)    Take 1,000 Units by mouth daily.   DIVALPROEX (DEPAKOTE SPRINKLE) 125 MG CAPSULE    Take two tablets =  by mouth twice daily for seizures. DO NOT CRUSH OR CHEW   DOCUSATE SODIUM (COLACE PO)    Take 20 mLs by mouth 2 (two) times daily.   DONEPEZIL (ARICEPT) 5 MG TABLET    Take 5 mg by mouth at bedtime.   FOLIC ACID (FOLVITE) 1 MG TABLET    Take 1 mg by mouth every other day.   HYDROCODONE-ACETAMINOPHEN  (NORCO/VICODIN) 5-325 MG TABLET    Take 0.5 tablets by mouth every 4 (four) hours as needed for moderate pain (DO NOT EXCEED 4GM/24 HOURS).    LORAZEPAM (ATIVAN) 0.5 MG TABLET    Take 0.5 mg by mouth 2 (two) times daily.   LORAZEPAM (ATIVAN) 0.5 MG TABLET    Take 0.5 mg by mouth every 4 (four) hours as needed for anxiety.   LOSARTAN (COZAAR) 100 MG TABLET    Take 100 mg by mouth daily.     MEMANTINE (NAMENDA) 10 MG TABLET    Take 10 mg by mouth 2 (two) times daily.   MENTHOL-ZINC OXIDE (CALMOSEPTINE) 0.44-20.6 % OINT    Apply topically. Apply to sacral region after each incontinent episode   METOPROLOL TARTRATE (LOPRESSOR) 25 MG TABLET    Take 25 mg by mouth 2 (two) times daily.   MONTELUKAST (SINGULAIR) 10 MG TABLET    Take 10 mg by mouth at bedtime.   MULTIPLE VITAMINS-MINERALS (DECUBI-VITE) CAPS    Take 1 capsule by mouth daily.   PANTOPRAZOLE (PROTONIX) 20 MG TABLET    Take 20 mg by mouth daily.   PROTEIN (PROCEL PO)  Take 1 scoop by mouth 2 (two) times daily.   ROPINIROLE (REQUIP) 0.25 MG TABLET    Take 0.25 mg by mouth at bedtime.   ROSUVASTATIN (CRESTOR) 10 MG TABLET    Take 10 mg by mouth daily.     UNABLE TO FIND    Med Name: MedPass 90 mL TID  Modified Medications   No medications on file  Discontinued Medications   ASPIRIN EC 81 MG TABLET    Take 81 mg by mouth every other day.   FOLIC ACID (FOLVITE) 1 MG TABLET    Take 1 mg by mouth every other day.       Physical Exam: Filed Vitals:   10/23/15 1632  BP: 159/82  Pulse: 70  Temp: 97.4 F (36.3 C)  TempSrc: Oral  Resp: 16  Height: 5\' 2"  (1.575 m)  Weight: 119 lb 6.4 oz (54.159 kg)   Wt Readings from Last 3 Encounters:  10/23/15 119 lb 6.4 oz (54.159 kg)  10/03/15 122 lb (55.339 kg)  09/11/15 123 lb (55.792 kg)   Body mass index is 21.83 kg/(m^2).   Constitutional: Frail, elderly, pleasantly demented  Head: Normocephalic and atraumatic.   Mouth: refuses to open her mouth Eyes: Pupils are equal, round, and  reactive to light.  Neck: Normal range of motion. Neck supple.  Cardiovascular: Normal rate and regular rhythm.   Pulmonary/Chest: Effort normal and breath sounds normal.  Abdominal: Soft. Bowel sounds are normal.  Musculoskeletal: She exhibits no edema. Generalized weakness, has contracture in both hands Neurological: She is alert but not oriented Skin: Skin is warm and dry.  Psychiatric: She has a normal mood and affect.    Labs reviewed: CBC Latest Ref Rng 09/22/2015 2020-04-1215 07/16/2015  WBC - 7.4 8.7 11.7(H)  Hemoglobin 12.0 - 16.0 g/dL 16.112.5 11.2(A) 13.0  Hematocrit 36 - 46 % 38 35(A) 39.6  Platelets 150 - 399 K/L 68(A) 217 230   CMP Latest Ref Rng 09/22/2015 2020-04-1215 07/16/2015  Glucose 65 - 99 mg/dL - - 096(E130(H)  BUN 4 - 21 mg/dL 18 45(W31(A) 09(W40(H)  Creatinine 0.5 - 1.1 mg/dL 0.8 1.5(A) 2.01(H)  Sodium 137 - 147 mmol/L 142 136(A) 140  Potassium 3.5 - 5.1 mmol/L - - 4.2  Chloride 101 - 111 mmol/L - - 100(L)  CO2 22 - 32 mmol/L - - 26  Calcium 8.9 - 10.3 mg/dL - - 9.4  Total Protein 6.5 - 8.1 g/dL - - 6.8  Total Bilirubin 0.3 - 1.2 mg/dL - - 0.5  Alkaline Phos 25 - 125 U/L 57 - 62  AST 15 - 41 U/L - - 23  ALT 7 - 35 U/L 9 - 16   Foot Exam Completed: 09/08/15 Eye Exam: Patient refused/combative  Assessment/Plan  HTN Elevated bp reading noted. Currently on amlodipine 5 mg daily, losartan 100 mg daily, metoprolol 50 mg bid. Continue to monitor bp.   alzhimer's dementia Stable, continue namenda 10 mg bid and aricpet 5 mg daily for now, decline anticipated, monitor po intake and weight. Fall precautions and pressure ulcer prophylaxis. Continue assistance with ADLs and aspiration precautions  Contracture to hand Continue norco 5-325 mg half a tab q4h prn pain for now. Continue vitamin d supplement and monitor  Vitamin d def Continue vitamin d supplement  Constipation Stable, continue colace bid  Hyperlipidemia  LDL at goal. Continue crestor but decrease to 5 mg  daily   Oneal GroutMAHIMA Gayl Ivanoff, MD  [facility] [phone] (Monday-Friday 8 am - 5 pm) [phone] (  afterhours)

## 2015-10-24 LAB — LIPID PANEL
Cholesterol: 138 mg/dL (ref 0–200)
HDL: 51 mg/dL (ref 35–70)
LDL CALC: 74 mg/dL
Triglycerides: 63 mg/dL (ref 40–160)

## 2015-11-28 ENCOUNTER — Non-Acute Institutional Stay (SKILLED_NURSING_FACILITY): Payer: Medicare HMO | Admitting: Adult Health

## 2015-11-28 ENCOUNTER — Encounter: Payer: Self-pay | Admitting: Adult Health

## 2015-11-28 DIAGNOSIS — K5909 Other constipation: Secondary | ICD-10-CM

## 2015-11-28 DIAGNOSIS — M1710 Unilateral primary osteoarthritis, unspecified knee: Secondary | ICD-10-CM

## 2015-11-28 DIAGNOSIS — F0280 Dementia in other diseases classified elsewhere without behavioral disturbance: Secondary | ICD-10-CM

## 2015-11-28 DIAGNOSIS — M179 Osteoarthritis of knee, unspecified: Secondary | ICD-10-CM | POA: Diagnosis not present

## 2015-11-28 DIAGNOSIS — E785 Hyperlipidemia, unspecified: Secondary | ICD-10-CM | POA: Diagnosis not present

## 2015-11-28 DIAGNOSIS — I10 Essential (primary) hypertension: Secondary | ICD-10-CM | POA: Diagnosis not present

## 2015-11-28 DIAGNOSIS — E559 Vitamin D deficiency, unspecified: Secondary | ICD-10-CM | POA: Diagnosis not present

## 2015-11-28 DIAGNOSIS — K219 Gastro-esophageal reflux disease without esophagitis: Secondary | ICD-10-CM

## 2015-11-28 DIAGNOSIS — R569 Unspecified convulsions: Secondary | ICD-10-CM

## 2015-11-28 DIAGNOSIS — K5900 Constipation, unspecified: Secondary | ICD-10-CM | POA: Diagnosis not present

## 2015-11-28 DIAGNOSIS — G308 Other Alzheimer's disease: Secondary | ICD-10-CM

## 2015-11-28 DIAGNOSIS — IMO0002 Reserved for concepts with insufficient information to code with codable children: Secondary | ICD-10-CM

## 2015-11-28 DIAGNOSIS — G2581 Restless legs syndrome: Secondary | ICD-10-CM

## 2015-11-28 DIAGNOSIS — F419 Anxiety disorder, unspecified: Secondary | ICD-10-CM | POA: Diagnosis not present

## 2015-11-28 NOTE — Progress Notes (Signed)
Patient ID: Ashley FenderFrances Runkles, female   DOB: 04-02-1931, 82 y.o.   MRN: [id number]    DATE:  11/28/2015   MRN:  [id number]  BIRTHDAY: 04-02-1931  Facility:  Nursing Home Location:  [facility]  Nursing Home Room Number: 302-2  LEVEL OF CARE:  SNF [phone]([phone]  Contact Information    Name Relation Home Work [place]   Lader,Ezra Spouse [phone]  [phone]   Josephine IgoJarrell,Paula Daughter [phone]       Code Status History    Date Active Date Inactive Code Status Order ID Comments User Context   08/03/2013  2:59 PM 08/06/2013  4:52 PM DNR [id number]  Esperanza SheetsUlugbek N Buriev, MD ED   08/03/2013  2:09 PM 08/03/2013  2:59 PM DNR [id number]  Esperanza SheetsUlugbek N Buriev, MD ED       Chief Complaint  Patient presents with  . Medical Management of Chronic Issues    HISTORY OF PRESENT ILLNESS:  This is an 82 year old female who is being seen for a routine visit. She is comfort care/hospice. Crestor dosage was decreased last month. Latest cholesterol 138, HDL 51.4 LDL 74.  Her BP has been stable with latest BP 131/68. She continues to take Norvasc, Cozaar and Metoprolol.  PAST MEDICAL HISTORY:  Past Medical History  Diagnosis Date  . Alzheimer disease   . Dementia   . Hypertension   . Hypercholesteremia   . GERD (gastroesophageal reflux disease)   . Hyponatremia   . Asthma   . Borderline diabetes   . Seizures (HCC)     "one 5 years ago; none since as far as I know" (08/03/2013)  . Stroke [facility](HCC)     "said she had a mini stroke" (08/03/2013)  . Pneumonia 10/2012  . CKD (chronic kidney disease)   . Osteoarthrosis   . Decubitus ulcer      CURRENT MEDICATIONS: Reviewed  Patient's Medications  New Prescriptions   No medications on file  Previous Medications   AMLODIPINE (NORVASC) 5 MG TABLET    Take 5 mg by mouth daily.   ASPIRIN 81 MG CHEWABLE TABLET    Chew 81 mg by mouth every other day.   CHOLECALCIFEROL (VITAMIN D-3 PO)    Take 1,000 Units by mouth  daily.   DIVALPROEX (DEPAKOTE SPRINKLE) 125 MG CAPSULE    Take two tablets = 250mg  by mouth twice daily for seizures. DO NOT CRUSH OR CHEW   DOCUSATE (COLACE) 50 MG/5ML LIQUID    Take 200 mg by mouth daily.   DONEPEZIL (ARICEPT) 5 MG TABLET    Take 5 mg by mouth at bedtime.   FOLIC ACID (FOLVITE) 1 MG TABLET    Take 1 mg by mouth every other day.   HYDROCODONE-ACETAMINOPHEN (NORCO/VICODIN) 5-325 MG TABLET    Take 0.5 tablets by mouth every 4 (four) hours as needed for moderate pain (DO NOT EXCEED 4GM/24 HOURS).    LORAZEPAM (ATIVAN) 0.5 MG TABLET    Take 0.5 mg by mouth 2 (two) times daily.   LORAZEPAM (ATIVAN) 0.5 MG TABLET    Take 0.5 mg by mouth every 4 (four) hours as needed for anxiety.   LOSARTAN (COZAAR) 100 MG TABLET    Take 100 mg by mouth daily.     MEMANTINE (NAMENDA) 10 MG TABLET    Take 10 mg by mouth 2 (two) times daily.   MENTHOL-ZINC OXIDE (CALMOSEPTINE) 0.44-20.6 % OINT    Apply topically. Apply to sacral region after each incontinent episode   METOPROLOL  TARTRATE (LOPRESSOR) 25 MG TABLET    Take 25 mg by mouth 2 (two) times daily.   MONTELUKAST (SINGULAIR) 10 MG TABLET    Take 10 mg by mouth at bedtime.   MULTIPLE VITAMINS-MINERALS (DECUBI-VITE) CAPS    Take 1 capsule by mouth daily.   PANTOPRAZOLE (PROTONIX) 20 MG TABLET    Take 20 mg by mouth daily.   PROTEIN (PROCEL PO)    Take 1 scoop by mouth 2 (two) times daily.   ROPINIROLE (REQUIP) 0.25 MG TABLET    Take 0.25 mg by mouth at bedtime.   ROSUVASTATIN (CRESTOR) 5 MG TABLET    Take 5 mg by mouth daily.   UNABLE TO FIND    Med Name: MedPass 90 mL TID  Modified Medications   No medications on file  Discontinued Medications   DOCUSATE SODIUM (COLACE PO)    Take 20 mLs by mouth 2 (two) times daily.   ROSUVASTATIN (CRESTOR) 10 MG TABLET    Take 10 mg by mouth daily.       Allergies  Allergen Reactions  . Penicillins Other (See Comments)    Per mar     REVIEW OF SYSTEMS:  Unable to obtain due to advanced  Dementia   PHYSICAL EXAMINATION  GENERAL APPEARANCE: Well nourished. In no acute distress. Normal body habitus SKIN:  Skin is warm and dry.  HEAD: Normal in size and contour. No evidence of trauma EARS: Pinnae are normal. Patient hears normal voice tunes of the examiner MOUTH and THROAT: Lips are without lesions. Oral mucosa is moist and without lesions. Tongue is normal in shape, size, and color and without lesions NECK: supple, trachea midline, no neck masses, no thyroid tenderness, no thyromegaly LYMPHATICS: no LAN in the neck, no supraclavicular LAN RESPIRATORY: breathing is even & unlabored, BS CTAB CARDIAC: RRR, no murmur,no extra heart sounds, no edema GI: abdomen soft, normal BS, no masses, no tenderness, no hepatomegaly, no splenomegaly EXTREMITIES:  Right hand is contracted; limited ROM on bilateral shoulders; generalized weakness on BLE PSYCHIATRIC:  Disoriented to time and place. Affect and behavior are appropriate  LABS/RADIOLOGY: Labs reviewed: Basic Metabolic Panel:  Recent Labs  16/10/96 07/16/15 1953 08/09/15 09/22/15 10/05/15  NA 142 140 136* 142  --   K 4.7 4.2  --   --  4.1  CL  --  100*  --   --   --   CO2  --  26  --   --   --   GLUCOSE  --  130*  --   --   --   BUN 30* 40* 31* 18  --   CREATININE 1.1 2.01* 1.5* 0.8  --   CALCIUM  --  9.4  --   --   --    Liver Function Tests:  Recent Labs  04/25/15 07/16/15 1953 09/22/15  AST 12* 23  --   ALT ALKPHOS 57 62 57  BILITOT  --  0.5  --   PROT  --  6.8  --   ALBUMIN  --  3.7  --    CBC:  Recent Labs  04/25/15 07/16/15 1953 08/09/15 09/22/15  WBC 7.7 11.7* 8.7 7.4  NEUTROABS 4  --   --   --   HGB 12.3 13.0 11.2* 12.5  HCT 39 39.6 35* 38  MCV  --  91.5  --   --   PLT 188 230 217 68*    Cardiac Enzymes:  Recent  Labs  07/16/15 1953  TROPONINI <0.03   Lipid Panel     Component Value Date/Time   CHOL 138 10/24/2015   TRIG 63 10/24/2015   HDL 51 10/24/2015   LDLCALC 74  10/24/2015      ASSESSMENT/PLAN:  Seizure - No seizure activity; continue Depakote 250 mg by mouth twice a day  Hypertension - well-controlled; continue Norvasc 5 mg by mouth daily, Cozaar 100 mg by mouth daily and Lopressor 25 mg by mouth twice a day   Hyperlipidemia - continue Crestor 5 mg by mouth daily  GERD - continue Protonix 40 mg by mouth daily  Anxiety - mood is stable; continue Ativan 0.5 mg 1 tab by mouth twice a day and Ativan 0.5 mg by mouth every 4 hours when necessary  Vitamin D deficiency - continue vitamin D3 1000 units 1 tab by mouth daily  Constipation - continue Colace 200 mg/20 mL by mouth twice a day  Alzheimer's disease - advanced; continue Namenda 10 mg by mouth twice a day and Aricept 5 mg by mouth daily  Restless leg syndrome - stable; continue Requip 0.25 mg by mouth daily at bedtime  Osteoarthrosis - continue Norco 5/325 mg 1/2 tab by mouth every 4 hours when necessary      Goals of care:  Long-term care/Hospice care   [facility], NP [facility] [phone]

## 2015-12-19 ENCOUNTER — Encounter: Payer: Self-pay | Admitting: Adult Health

## 2015-12-19 ENCOUNTER — Non-Acute Institutional Stay (SKILLED_NURSING_FACILITY): Payer: Medicare HMO | Admitting: Adult Health

## 2015-12-19 DIAGNOSIS — F419 Anxiety disorder, unspecified: Secondary | ICD-10-CM

## 2015-12-19 DIAGNOSIS — K5900 Constipation, unspecified: Secondary | ICD-10-CM

## 2015-12-19 DIAGNOSIS — E559 Vitamin D deficiency, unspecified: Secondary | ICD-10-CM | POA: Diagnosis not present

## 2015-12-19 DIAGNOSIS — G308 Other Alzheimer's disease: Secondary | ICD-10-CM | POA: Diagnosis not present

## 2015-12-19 DIAGNOSIS — R569 Unspecified convulsions: Secondary | ICD-10-CM

## 2015-12-19 DIAGNOSIS — E785 Hyperlipidemia, unspecified: Secondary | ICD-10-CM | POA: Diagnosis not present

## 2015-12-19 DIAGNOSIS — F0280 Dementia in other diseases classified elsewhere without behavioral disturbance: Secondary | ICD-10-CM

## 2015-12-19 DIAGNOSIS — G2581 Restless legs syndrome: Secondary | ICD-10-CM

## 2015-12-19 DIAGNOSIS — M1710 Unilateral primary osteoarthritis, unspecified knee: Secondary | ICD-10-CM

## 2015-12-19 DIAGNOSIS — IMO0002 Reserved for concepts with insufficient information to code with codable children: Secondary | ICD-10-CM

## 2015-12-19 DIAGNOSIS — M179 Osteoarthritis of knee, unspecified: Secondary | ICD-10-CM

## 2015-12-19 DIAGNOSIS — K219 Gastro-esophageal reflux disease without esophagitis: Secondary | ICD-10-CM

## 2015-12-19 DIAGNOSIS — I10 Essential (primary) hypertension: Secondary | ICD-10-CM

## 2015-12-19 DIAGNOSIS — K5909 Other constipation: Secondary | ICD-10-CM

## 2015-12-19 NOTE — Progress Notes (Signed)
Patient ID: [name], female   DOB: 07/23/1931, 82 y.o.   MRN: [id number]    DATE:  12/19/2015   MRN:  [id number]  BIRTHDAY: 1930-11-05  Facility:  Nursing Home Location:  [facility]  Nursing Home Room Number: 302-2  LEVEL OF CARE:  SNF [phone])  Contact Information    Name Relation Home Work [place] Spouse [phone]   [name] Daughter [phone]       Code Status History    Date Active Date Inactive Code Status Order ID Comments User Context   08/03/2013  2:59 PM 08/06/2013  4:52 PM DNR [id number]  Esperanza Sheets, MD ED   08/03/2013  2:09 PM 08/03/2013  2:59 PM DNR [id number]  Esperanza Sheets, MD ED       Chief Complaint  Patient presents with  . Medical Management of Chronic Issues    HISTORY OF PRESENT ILLNESS:  This is an 82 year old female who is being seen for a routine visit. She is comfort care/hospice. BP review- 128/85, 132/79, 131/76, well-controlled. She currently takes Norvasc, Lopressor and Cozaar for hypertension. Her mood is stable and continues to take Ativan.  PAST MEDICAL HISTORY:  Past Medical History  Diagnosis Date  . Alzheimer disease   . Dementia   . Hypertension   . Hypercholesteremia   . GERD (gastroesophageal reflux disease)   . Hyponatremia   . Asthma   . Borderline diabetes   . Seizures (HCC)     "one 5 years ago; none since as far as I know" (08/03/2013)  . Stroke [facility])     "said she had a mini stroke" (08/03/2013)  . Pneumonia 10/2012  . CKD (chronic kidney disease)   . Osteoarthrosis   . Decubitus ulcer      CURRENT MEDICATIONS: Reviewed  Patient's Medications  New Prescriptions   No medications on file  Previous Medications   AMLODIPINE (NORVASC) 5 MG TABLET    Take 5 mg by mouth daily.   ASPIRIN 81 MG CHEWABLE TABLET    Chew 81 mg by mouth every other day.   CHOLECALCIFEROL (VITAMIN D-3 PO)    Take 1,000 Units by mouth daily.   DIVALPROEX (DEPAKOTE SPRINKLE) 125 MG CAPSULE    Take two tablets =  by mouth twice daily for seizures. DO NOT CRUSH OR CHEW   DOCUSATE (COLACE) 50 MG/5ML LIQUID    Take 200 mg by mouth 2 (two) times daily.    DONEPEZIL (ARICEPT) 5 MG TABLET    Take 5 mg by mouth at bedtime.   FOLIC ACID (FOLVITE) 1 MG TABLET    Take 1 mg by mouth every other day.   HYDROCODONE-ACETAMINOPHEN (NORCO/VICODIN) 5-325 MG TABLET    Take 0.5 tablets by mouth every 4 (four) hours as needed for moderate pain (DO NOT EXCEED 4GM/24 HOURS).    LORAZEPAM (ATIVAN) 0.5 MG TABLET    Take 0.5 mg by mouth 2 (two) times daily.   LORAZEPAM (ATIVAN) 0.5 MG TABLET    Take 0.5 mg by mouth every 4 (four) hours as needed for anxiety.   LOSARTAN (COZAAR) 100 MG TABLET    Take 100 mg by mouth daily.     MEMANTINE (NAMENDA) 10 MG TABLET    Take 10 mg by mouth 2 (two) times daily.   MENTHOL-ZINC OXIDE (CALMOSEPTINE) 0.44-20.6 % OINT    Apply topically. Apply to sacral region after each incontinent episode   METOPROLOL TARTRATE (LOPRESSOR) 25 MG  TABLET    Take 25 mg by mouth 2 (two) times daily.   MONTELUKAST (SINGULAIR) 10 MG TABLET    Take 10 mg by mouth at bedtime.   MULTIPLE VITAMINS-MINERALS (DECUBI-VITE) CAPS    Take 1 capsule by mouth daily.   PANTOPRAZOLE (PROTONIX) 20 MG TABLET    Take 20 mg by mouth daily.   PROTEIN (PROCEL PO)    Take 1 scoop by mouth 2 (two) times daily.   ROPINIROLE (REQUIP) 0.25 MG TABLET    Take 0.25 mg by mouth at bedtime.   ROSUVASTATIN (CRESTOR) 5 MG TABLET    Take 5 mg by mouth daily.   UNABLE TO FIND    Med Name: MedPass 90 mL TID  Modified Medications   No medications on file  Discontinued Medications   No medications on file     Allergies  Allergen Reactions  . Penicillins Other (See Comments)    Per mar     REVIEW OF SYSTEMS:  Unable to obtain due to advanced Dementia   PHYSICAL EXAMINATION  GENERAL APPEARANCE: Well nourished. In no acute distress. Normal body habitus SKIN:  Skin  is warm and dry.  HEAD: Normal in size and contour. No evidence of trauma EARS: Pinnae are normal. Patient hears normal voice tunes of the examiner MOUTH and THROAT: Lips are without lesions. Oral mucosa is moist and without lesions. Tongue is normal in shape, size, and color and without lesions NECK: supple, trachea midline, no neck masses, no thyroid tenderness, no thyromegaly LYMPHATICS: no LAN in the neck, no supraclavicular LAN RESPIRATORY: breathing is even & unlabored, BS CTAB CARDIAC: RRR, no murmur,no extra heart sounds, no edema GI: abdomen soft, normal BS, no masses, no tenderness, no hepatomegaly, no splenomegaly EXTREMITIES:  Right hand is contracted; limited ROM on bilateral shoulders; generalized weakness on BLE PSYCHIATRIC:  Disoriented to time and place. Affect and behavior are appropriate  LABS/RADIOLOGY: Labs reviewed: Lab Results  Component Value Date   CHOL 138 10/24/2015   HDL 51 10/24/2015   LDLCALC 74 10/24/2015   TRIG 63 10/24/2015    Basic Metabolic Panel:  Recent Labs  16/05/9608/13/16 07/16/15 1953 08/09/15 09/22/15 10/05/15  NA 142 140 136* 142  --   K 4.7 4.2  --   --  4.1  CL  --  100*  --   --   --   CO2  --  26  --   --   --   GLUCOSE  --  130*  --   --   --   BUN 30* 40* 31* 18  --   CREATININE 1.1 2.01* 1.5* 0.8  --   CALCIUM  --  9.4  --   --   --    Liver Function Tests:  Recent Labs  04/25/15 07/16/15 1953 09/22/15  AST 12* 23  --   ALT 8 16 9   ALKPHOS 57 62 57  BILITOT  --  0.5  --   PROT  --  6.8  --   ALBUMIN  --  3.7  --    CBC:  Recent Labs  04/25/15 07/16/15 1953 08/09/15 09/22/15  WBC 7.7 11.7* 8.7 7.4  NEUTROABS 4  --   --   --   HGB 12.3 13.0 11.2* 12.5  HCT 39 39.6 35* 38  MCV  --  91.5  --   --   PLT 188 230 217 68*    Cardiac Enzymes:  Recent Labs  07/16/15 1953  TROPONINI <0.03   Lipid Panel     Component Value Date/Time   CHOL 138 10/24/2015   TRIG 63 10/24/2015   HDL 51 10/24/2015   LDLCALC 74  10/24/2015      ASSESSMENT/PLAN:  Hyperlipidemia - continue Crestor 5 mg by mouth daily Lab Results  Component Value Date   CHOL 138 10/24/2015   HDL 51 10/24/2015   LDLCALC 74 10/24/2015   TRIG 63 10/24/2015    Seizure - No seizure activity; continue Depakote 250 mg by mouth twice a day; depakote level 43.3 (2/17)  Vitamin D deficiency - continue vitamin D3 1000 units 1 tab by mouth daily  Constipation - continue Colace 200 mg/20 mL by mouth twice a day  Restless leg syndrome - stable; continue Requip 0.25 mg by mouth daily at bedtime  Hypertension - well-controlled; continue Norvasc 5 mg by mouth daily, Cozaar 100 mg by mouth daily and Lopressor 25 mg by mouth twice a day   GERD - continue Protonix 40 mg by mouth daily  Anxiety - mood is stable; continue Ativan 0.5 mg 1 tab by mouth twice a day and Ativan 0.5 mg by mouth every 4 hours when necessary  Alzheimer's disease - advanced; continue Namenda 10 mg by mouth twice a day and Aricept 5 mg by mouth daily  Osteoarthrosis - continue Norco 5/325 mg 1/2 tab by mouth every 4 hours when necessary      Goals of care:  Long-term care/Hospice care   Kenard Gower, NP [facility] [phone]

## 2016-01-03 ENCOUNTER — Other Ambulatory Visit: Payer: Self-pay

## 2016-01-03 MED ORDER — LORAZEPAM 0.5 MG PO TABS: 0.5000 mg | ORAL_TABLET | Freq: Two times a day (BID) | ORAL | Status: DC

## 2016-01-03 MED ORDER — LORAZEPAM 0.5 MG PO TABS: 0.5000 mg | ORAL_TABLET | ORAL | Status: DC | PRN

## 2016-01-03 NOTE — Telephone Encounter (Signed)
Prescription request was received from:  [facility] [address] [place] [address]  Phone: [phone]  Fax: [phone]  

## 2016-01-18 ENCOUNTER — Non-Acute Institutional Stay (SKILLED_NURSING_FACILITY): Payer: Medicare HMO | Admitting: Adult Health

## 2016-01-18 ENCOUNTER — Encounter: Payer: Self-pay | Admitting: Adult Health

## 2016-01-18 DIAGNOSIS — K5900 Constipation, unspecified: Secondary | ICD-10-CM

## 2016-01-18 DIAGNOSIS — M179 Osteoarthritis of knee, unspecified: Secondary | ICD-10-CM | POA: Diagnosis not present

## 2016-01-18 DIAGNOSIS — E785 Hyperlipidemia, unspecified: Secondary | ICD-10-CM

## 2016-01-18 DIAGNOSIS — E559 Vitamin D deficiency, unspecified: Secondary | ICD-10-CM | POA: Diagnosis not present

## 2016-01-18 DIAGNOSIS — IMO0002 Reserved for concepts with insufficient information to code with codable children: Secondary | ICD-10-CM

## 2016-01-18 DIAGNOSIS — R569 Unspecified convulsions: Secondary | ICD-10-CM

## 2016-01-18 DIAGNOSIS — I10 Essential (primary) hypertension: Secondary | ICD-10-CM | POA: Diagnosis not present

## 2016-01-18 DIAGNOSIS — G2581 Restless legs syndrome: Secondary | ICD-10-CM | POA: Diagnosis not present

## 2016-01-18 DIAGNOSIS — K5909 Other constipation: Secondary | ICD-10-CM

## 2016-01-18 DIAGNOSIS — M1710 Unilateral primary osteoarthritis, unspecified knee: Secondary | ICD-10-CM

## 2016-01-18 DIAGNOSIS — K219 Gastro-esophageal reflux disease without esophagitis: Secondary | ICD-10-CM | POA: Diagnosis not present

## 2016-01-18 DIAGNOSIS — G308 Other Alzheimer's disease: Secondary | ICD-10-CM

## 2016-01-18 DIAGNOSIS — F0280 Dementia in other diseases classified elsewhere without behavioral disturbance: Secondary | ICD-10-CM | POA: Diagnosis not present

## 2016-01-18 DIAGNOSIS — F419 Anxiety disorder, unspecified: Secondary | ICD-10-CM | POA: Diagnosis not present

## 2016-01-18 NOTE — Progress Notes (Signed)
Patient ID: [name], female   DOB: November 05, 1930, 82 y.o.   MRN: [id number]    DATE:  01/18/2016   MRN:  [id number]  BIRTHDAY: April 02, 1931  Facility:  Nursing Home Location:  [facility]  Nursing Home Room Number: 302-2  LEVEL OF CARE:  SNF [phone])  Contact Information    Name Relation Home Work [place] Spouse [phone]  [phone]   [name] Daughter [phone]       Code Status History    Date Active Date Inactive Code Status Order ID Comments User Context   08/03/2013  2:59 PM 08/06/2013  4:52 PM DNR [id number]  Esperanza Sheets, MD ED   08/03/2013  2:09 PM 08/03/2013  2:59 PM DNR [id number]  Esperanza Sheets, MD ED       Chief Complaint  Patient presents with  . Medical Management of Chronic Issues    HISTORY OF PRESENT ILLNESS:  This is an 82 year old female who is being seen for a routine visit. She is comfort care/hospice. Her mood has been stable and she continues to take Ativan for anxiety. No complaints of abdominal pain and continues to take Protonix for GERD.  PAST MEDICAL HISTORY:  Past Medical History  Diagnosis Date  . Alzheimer disease     Without behavioral disturbance  . Dementia   . Hypertension   . Hypercholesteremia   . GERD (gastroesophageal reflux disease)   . Hyponatremia   . Asthma   . Borderline diabetes   . Seizures (HCC)     "one 5 years ago; none since as far as I know" (08/03/2013)  . Stroke [facility])     "said she had a mini stroke" (08/03/2013)  . Pneumonia 10/2012  . CKD (chronic kidney disease)   . Osteoarthrosis   . Decubitus ulcer   . RLS (restless legs syndrome)   . HLD (hyperlipidemia)   . Anxiety   . Vitamin D deficiency   . Constipation, chronic      CURRENT MEDICATIONS: Reviewed  Patient's Medications  New Prescriptions   No medications on file  Previous Medications   AMLODIPINE (NORVASC) 5 MG TABLET    Take 5 mg by mouth daily.   ASPIRIN 81 MG  CHEWABLE TABLET    Chew 81 mg by mouth every other day.   CHOLECALCIFEROL (VITAMIN D-3 PO)    Take 1,000 Units by mouth daily.   DIVALPROEX (DEPAKOTE SPRINKLE) 125 MG CAPSULE    Take two tablets = 250mg  by mouth twice daily for seizures. DO NOT CRUSH OR CHEW   DOCUSATE (COLACE) 50 MG/5ML LIQUID    Take 200 mg by mouth 2 (two) times daily.    DONEPEZIL (ARICEPT) 5 MG TABLET    Take 5 mg by mouth at bedtime.   FOLIC ACID (FOLVITE) 1 MG TABLET    Take 1 mg by mouth every other day.   HYDROCODONE-ACETAMINOPHEN (NORCO/VICODIN) 5-325 MG TABLET    Take 0.5 tablets by mouth every 4 (four) hours as needed for moderate pain (DO NOT EXCEED 4GM/24 HOURS).    LORAZEPAM (ATIVAN) 0.5 MG TABLET    Take 1 tablet (0.5 mg total) by mouth 2 (two) times daily.   LORAZEPAM (ATIVAN) 0.5 MG TABLET    Take 1 tablet (0.5 mg total) by mouth every 4 (four) hours as needed for anxiety.   LOSARTAN (COZAAR) 100 MG TABLET    Take 100 mg by mouth daily.     MEMANTINE (NAMENDA) 10 MG  TABLET    Take 10 mg by mouth 2 (two) times daily.   MENTHOL-ZINC OXIDE (CALMOSEPTINE) 0.44-20.6 % OINT    Apply topically. Apply to sacral region after each incontinent episode   METOPROLOL TARTRATE (LOPRESSOR) 25 MG TABLET    Take 25 mg by mouth 2 (two) times daily.   MULTIPLE VITAMINS-MINERALS (DECUBI-VITE) CAPS    Take 1 capsule by mouth daily.   PANTOPRAZOLE (PROTONIX) 20 MG TABLET    Take 20 mg by mouth daily.   PROTEIN (PROCEL PO)    Take 1 scoop by mouth 2 (two) times daily.   ROPINIROLE (REQUIP) 0.25 MG TABLET    Take 0.25 mg by mouth at bedtime.   ROSUVASTATIN (CRESTOR) 5 MG TABLET    Take 5 mg by mouth daily.   UNABLE TO FIND    Med Name: MedPass 90 mL QID  Modified Medications   No medications on file  Discontinued Medications   MONTELUKAST (SINGULAIR) 10 MG TABLET    Take 10 mg by mouth at bedtime.     Allergies  Allergen Reactions  . Penicillins Other (See Comments)    Per mar     REVIEW OF SYSTEMS:  Unable to obtain due to  advanced Dementia   PHYSICAL EXAMINATION  GENERAL APPEARANCE: Well nourished. In no acute distress. Normal body habitus SKIN:  Skin is warm and dry.  HEAD: Normal in size and contour. No evidence of trauma EARS: Pinnae are normal. Patient hears normal voice tunes of the examiner MOUTH and THROAT: Lips are without lesions. Oral mucosa is moist and without lesions. Tongue is normal in shape, size, and color and without lesions NECK: supple, trachea midline, no neck masses, no thyroid tenderness, no thyromegaly LYMPHATICS: no LAN in the neck, no supraclavicular LAN RESPIRATORY: breathing is even & unlabored, BS CTAB CARDIAC: RRR, no murmur,no extra heart sounds, no edema GI: abdomen soft, normal BS, no masses, no tenderness, no hepatomegaly, no splenomegaly EXTREMITIES:  Bilateral hands are contracted; limited ROM on bilateral shoulders; generalized weakness on BLE PSYCHIATRIC:  Disoriented to time and place. Affect and behavior are appropriate  LABS/RADIOLOGY: Labs reviewed: Lab Results  Component Value Date   CHOL 138 10/24/2015   HDL 51 10/24/2015   LDLCALC 74 10/24/2015   TRIG 63 10/24/2015    Basic Metabolic Panel:  Recent Labs  81/19/14 07/16/15 1953 08/09/15 09/22/15 10/05/15  NA 142 140 136* 142  --   K 4.7 4.2  --   --  4.1  CL  --  100*  --   --   --   CO2  --  26  --   --   --   GLUCOSE  --  130*  --   --   --   BUN 30* 40* 31* 18  --   CREATININE 1.1 2.01* 1.5* 0.8  --   CALCIUM  --  9.4  --   --   --    Liver Function Tests:  Recent Labs  04/25/15 07/16/15 1953 09/22/15  AST 12* 23  --   ALT ALKPHOS 57 62 57  BILITOT  --  0.5  --   PROT  --  6.8  --   ALBUMIN  --  3.7  --    CBC:  Recent Labs  04/25/15 07/16/15 1953 08/09/15 09/22/15  WBC 7.7 11.7* 8.7 7.4  NEUTROABS 4  --   --   --   HGB 12.3 13.0 11.2* 12.5  HCT 39 39.6 35* 38  MCV  --  91.5  --   --   PLT 188 230 217 68*    Cardiac Enzymes:  Recent Labs  07/16/15 1953   TROPONINI <0.03   Lipid Panel     Component Value Date/Time   CHOL 138 10/24/2015   TRIG 63 10/24/2015   HDL 51 10/24/2015   LDLCALC 74 10/24/2015      ASSESSMENT/PLAN:  Hypertension - well-controlled; continue Norvasc 5 mg by mouth daily, Cozaar 100 mg by mouth daily and Lopressor 25 mg by mouth twice a day ; BP/HR BID X 1 week; check CMP   Restless leg syndrome - stable; continue Requip 0.25 mg by mouth daily at bedtime  Hyperlipidemia - continue Crestor 5 mg by mouth daily  Seizure - No seizure activity; continue Depakote 250 mg by mouth twice a day; check Depakote level  Vitamin D deficiency - continue vitamin D3 1000 units 1 tab by mouth daily  Constipation - continue Colace 200 mg/20 mL by mouth twice a day  GERD - continue Protonix 40 mg by mouth daily; check CBC  Anxiety - mood is stable; continue Ativan 0.5 mg 1 tab by mouth twice a day and Ativan 0.5 mg by mouth every 4 hours when necessary  Alzheimer's disease - advanced; continue Namenda 10 mg by mouth twice a day and Aricept 5 mg by mouth daily  Osteoarthrosis - continue Norco 5/325 mg 1/2 tab by mouth every 4 hours when necessary      Goals of care:  Long-term care/Hospice care   Kenard GowerMonina Medina-Vargas, NP [facility] [phone]

## 2016-01-23 ENCOUNTER — Non-Acute Institutional Stay (SKILLED_NURSING_FACILITY): Payer: Medicare HMO | Admitting: Adult Health

## 2016-01-23 DIAGNOSIS — H109 Unspecified conjunctivitis: Secondary | ICD-10-CM | POA: Diagnosis not present

## 2016-01-23 LAB — HEPATIC FUNCTION PANEL
ALK PHOS: 65 U/L (ref 25–125)
ALT: 11 U/L (ref 7–35)
AST: 13 U/L (ref 13–35)
Bilirubin, Total: 0.5 mg/dL

## 2016-01-23 LAB — CBC AND DIFFERENTIAL
HEMATOCRIT: 41 % (ref 36–46)
HEMOGLOBIN: 13 g/dL (ref 12.0–16.0)
NEUTROS ABS: 3 /uL
Platelets: 204 10*3/uL (ref 150–399)
WBC: 7.7 10*3/mL

## 2016-01-23 LAB — BASIC METABOLIC PANEL
BUN: 22 mg/dL — AB (ref 4–21)
Creatinine: 1.2 mg/dL — AB (ref 0.5–1.1)
GLUCOSE: 97 mg/dL
Potassium: 4.1 mmol/L (ref 3.4–5.3)
SODIUM: 137 mmol/L (ref 137–147)

## 2016-01-23 NOTE — Progress Notes (Signed)
Patient ID: [name], female   DOB: 1930-08-18, 82 y.o.   MRN: [id number]    DATE:  01/23/16  MRN:  [id number]  BIRTHDAY: 1931/08/01  Facility:  Nursing Home Location:  [facility]  Nursing Home Room Number: 302-B  LEVEL OF CARE:  SNF [phone])  Contact Information    Name Relation Home Work [place] Spouse [phone]  [phone]   [name] Daughter [phone]       Code Status History    Date Active Date Inactive Code Status Order ID Comments User Context   08/03/2013  2:59 PM 08/06/2013  4:52 PM DNR [id number]  Esperanza Sheets, MD ED   08/03/2013  2:09 PM 08/03/2013  2:59 PM DNR [id number]  Esperanza Sheets, MD ED      Chief Complaint  Patient presents with  . Acute Visit    Conjunctivitis     HISTORY OF PRESENT ILLNESS:  This is an 82 year old female who has been noted to have erythematous bilateral conjunctivae. She was noted to have yellowish greenish mucoid discharge from from bilateral eyes. No complaints of eye pain nor fever.  PAST MEDICAL HISTORY:  Past Medical History  Diagnosis Date  . Alzheimer disease     Without behavioral disturbance  . Dementia   . Hypertension   . Hypercholesteremia   . GERD (gastroesophageal reflux disease)   . Hyponatremia   . Asthma   . Borderline diabetes   . Seizures (HCC)     "one 5 years ago; none since as far as I know" (08/03/2013)  . Stroke [facility])     "said she had a mini stroke" (08/03/2013)  . Pneumonia 10/2012  . CKD (chronic kidney disease)   . Osteoarthrosis   . Decubitus ulcer   . RLS (restless legs syndrome)   . HLD (hyperlipidemia)   . Anxiety   . Vitamin D deficiency   . Constipation, chronic   . Conjunctivitis     Right     CURRENT MEDICATIONS: Reviewed  Patient's Medications  New Prescriptions   No medications on file  Previous Medications   AMLODIPINE (NORVASC) 5 MG TABLET    Take 5 mg by mouth daily.   ASPIRIN 81 MG CHEWABLE  TABLET    Chew 81 mg by mouth every other day.   CHOLECALCIFEROL (VITAMIN D-3 PO)    Take 1,000 Units by mouth daily.   DIVALPROEX (DEPAKOTE SPRINKLE) 125 MG CAPSULE    Take two tablets = 250mg  by mouth twice daily for seizures. DO NOT CRUSH OR CHEW   DOCUSATE (COLACE) 50 MG/5ML LIQUID    Take 200 mg by mouth 2 (two) times daily.    DONEPEZIL (ARICEPT) 5 MG TABLET    Take 5 mg by mouth at bedtime.   FOLIC ACID (FOLVITE) 1 MG TABLET    Take 1 mg by mouth every other day.   HYDROCODONE-ACETAMINOPHEN (NORCO/VICODIN) 5-325 MG TABLET    Take 0.5 tablets by mouth every 4 (four) hours as needed for moderate pain (DO NOT EXCEED 4GM/24 HOURS).    LORAZEPAM (ATIVAN) 0.5 MG TABLET    Take 1 tablet (0.5 mg total) by mouth every 4 (four) hours as needed for anxiety.   LOSARTAN (COZAAR) 100 MG TABLET    Take 100 mg by mouth daily.     MEMANTINE (NAMENDA) 10 MG TABLET    Take 10 mg by mouth 2 (two) times daily.   METOPROLOL TARTRATE (LOPRESSOR) 25 MG TABLET  Take 25 mg by mouth 2 (two) times daily.   MULTIPLE VITAMINS-MINERALS (DECUBI-VITE) CAPS    Take 1 capsule by mouth daily.   PANTOPRAZOLE (PROTONIX) 20 MG TABLET    Take 20 mg by mouth daily.   PROTEIN (PROCEL PO)    Take 1 scoop by mouth 2 (two) times daily.   ROPINIROLE (REQUIP) 0.25 MG TABLET    Take 0.25 mg by mouth at bedtime.   ROSUVASTATIN (CRESTOR) 5 MG TABLET    Take 5 mg by mouth daily.   UNABLE TO FIND    Med Name: MedPass 90 mL QID  Modified Medications   Modified Medication Previous Medication   LORAZEPAM (ATIVAN) 0.5 MG TABLET LORazepam (ATIVAN) 0.5 MG tablet      Take 1/2 tablet by mouth twice daily for anxiety    Take 1 tablet (0.5 mg total) by mouth 2 (two) times daily.  Discontinued Medications   ERYTHROMYCIN OP    Apply 1 application to eye 4 (four) times daily. Apply a 1 cm ribbon into both eyelids QID x7 days for conjunctivitis   MENTHOL-ZINC OXIDE (CALMOSEPTINE) 0.44-20.6 % OINT    Apply topically. Apply to sacral region after  each incontinent episode     Allergies  Allergen Reactions  . Penicillins Other (See Comments)    Per mar     REVIEW OF SYSTEMS:  Unable to obtain due to advanced Dementia   PHYSICAL EXAMINATION  GENERAL APPEARANCE: Well nourished. In no acute distress. Normal body habitus SKIN:  Skin is warm and dry.  EYES:  Erythematous bilateral conjunctivae with yellowish greenish mucoid discharge HEAD: Normal in size and contour. No evidence of trauma EARS: Pinnae are normal. Patient hears normal voice tunes of the examiner MOUTH and THROAT: Lips are without lesions. Oral mucosa is moist and without lesions. Tongue is normal in shape, size, and color and without lesions NECK: supple, trachea midline, no neck masses, no thyroid tenderness, no thyromegaly LYMPHATICS: no LAN in the neck, no supraclavicular LAN RESPIRATORY: breathing is even & unlabored, BS CTAB CARDIAC: RRR, no murmur,no extra heart sounds, no edema GI: abdomen soft, normal BS, no masses, no tenderness, no hepatomegaly, no splenomegaly EXTREMITIES:  Bilateral hands are contracted; limited ROM on bilateral shoulders; generalized weakness on BLE PSYCHIATRIC:  Disoriented to time and place. Affect and behavior are appropriate  LABS/RADIOLOGY: Labs reviewed: Lab Results  Component Value Date   CHOL 138 10/24/2015   HDL 51 10/24/2015   LDLCALC 74 10/24/2015   TRIG 63 10/24/2015    Basic Metabolic Panel:  Recent Labs  13/24/4010/11/26 1953 08/09/15 09/22/15 10/05/15 01/23/16  NA 140 136* 142  --  137  K 4.2  --   --  4.1 4.1  CL 100*  --   --   --   --   CO2 26  --   --   --   --   GLUCOSE 130*  --   --   --   --   BUN 40* 31* 18  --  22*  CREATININE 2.01* 1.5* 0.8  --  1.2*  CALCIUM 9.4  --   --   --   --    Liver Function Tests:  Recent Labs  04/25/15 07/16/15 1953 09/22/15 01/23/16  AST 12* 23  --  13  ALT 8 16 9 11   ALKPHOS 57 62 57 65  BILITOT  --  0.5  --   --   PROT  --  6.8  --   --  ALBUMIN  --  3.7  --    --    CBC:  Recent Labs  04/25/15 07/16/15 1953 08/09/15 09/22/15 01/23/16  WBC 7.7 11.7* 8.7 7.4 7.7  NEUTROABS 4  --   --   --  3  HGB 12.3 13.0 11.2* 12.5 13.0  HCT 39 39.6 35* 38 41  MCV  --  91.5  --   --   --   PLT 188 230 217 68* 204    Cardiac Enzymes:  Recent Labs  07/16/15 1953  TROPONINI <0.03   Lipid Panel     Component Value Date/Time   CHOL 138 10/24/2015   TRIG 63 10/24/2015   HDL 51 10/24/2015   LDLCALC 74 10/24/2015      ASSESSMENT/PLAN:  Conjunctivitis, right eye - start Erythromycin opthalmic ointment instill ~ 1 cm  Ribbon into both eyes QID X 7 days  Kenard Gower, NP [employer] [phone]

## 2016-01-24 ENCOUNTER — Encounter: Payer: Self-pay | Admitting: Adult Health

## 2016-02-09 ENCOUNTER — Other Ambulatory Visit: Payer: Self-pay | Admitting: *Deleted

## 2016-02-09 MED ORDER — LORAZEPAM 0.5 MG PO TABS: ORAL_TABLET | ORAL | Status: AC

## 2016-02-09 NOTE — Telephone Encounter (Signed)
[facility] 

## 2016-02-15 ENCOUNTER — Encounter: Payer: Self-pay | Admitting: Adult Health

## 2016-02-15 ENCOUNTER — Non-Acute Institutional Stay (SKILLED_NURSING_FACILITY): Payer: Medicare HMO | Admitting: Adult Health

## 2016-02-15 DIAGNOSIS — G2581 Restless legs syndrome: Secondary | ICD-10-CM | POA: Diagnosis not present

## 2016-02-15 DIAGNOSIS — E559 Vitamin D deficiency, unspecified: Secondary | ICD-10-CM

## 2016-02-15 DIAGNOSIS — F0280 Dementia in other diseases classified elsewhere without behavioral disturbance: Secondary | ICD-10-CM

## 2016-02-15 DIAGNOSIS — I10 Essential (primary) hypertension: Secondary | ICD-10-CM | POA: Diagnosis not present

## 2016-02-15 DIAGNOSIS — K219 Gastro-esophageal reflux disease without esophagitis: Secondary | ICD-10-CM

## 2016-02-15 DIAGNOSIS — K5900 Constipation, unspecified: Secondary | ICD-10-CM | POA: Diagnosis not present

## 2016-02-15 DIAGNOSIS — E46 Unspecified protein-calorie malnutrition: Secondary | ICD-10-CM

## 2016-02-15 DIAGNOSIS — M1710 Unilateral primary osteoarthritis, unspecified knee: Secondary | ICD-10-CM

## 2016-02-15 DIAGNOSIS — R569 Unspecified convulsions: Secondary | ICD-10-CM

## 2016-02-15 DIAGNOSIS — G308 Other Alzheimer's disease: Secondary | ICD-10-CM | POA: Diagnosis not present

## 2016-02-15 DIAGNOSIS — F419 Anxiety disorder, unspecified: Secondary | ICD-10-CM

## 2016-02-15 DIAGNOSIS — E785 Hyperlipidemia, unspecified: Secondary | ICD-10-CM

## 2016-02-15 DIAGNOSIS — IMO0002 Reserved for concepts with insufficient information to code with codable children: Secondary | ICD-10-CM

## 2016-02-15 DIAGNOSIS — M179 Osteoarthritis of knee, unspecified: Secondary | ICD-10-CM | POA: Diagnosis not present

## 2016-02-15 DIAGNOSIS — K5909 Other constipation: Secondary | ICD-10-CM

## 2016-02-15 NOTE — Progress Notes (Signed)
Patient ID: [name], female   DOB: April 18, 1931, 82 y.o.   MRN: [id number]    DATE:   02/15/16   MRN:  [id number]  BIRTHDAY: April 18, 1931  Facility:  Nursing Home Location:  [facility]  Nursing Home Room Number: 302-B  LEVEL OF CARE:  SNF [phone](31)  Contact Information    Name Relation Home Work [employer]   Wessell,Ezra Spouse [phone]  [phone]   Josephine IgoJarrell,Paula Daughter [phone]       Code Status History    Date Active Date Inactive Code Status Order ID Comments User Context   08/03/2013  2:59 PM 08/06/2013  4:52 PM DNR [id number]  Esperanza SheetsUlugbek N Buriev, MD ED   08/03/2013  2:09 PM 08/03/2013  2:59 PM DNR [id number]  Esperanza SheetsUlugbek N Buriev, MD ED       Chief Complaint  Patient presents with  . Medical Management of Chronic Issues    HISTORY OF PRESENT ILLNESS:  This is an 82 year old female who is being seen for a routine visit. She is comfort care/hospice. Ativan dosage was decreased to 0.25 mg BID since mood has been stable. She has recently been treated for bilateral conjunctivitis.  PAST MEDICAL HISTORY:  Past Medical History  Diagnosis Date  . Alzheimer disease     Without behavioral disturbance  . Dementia   . Hypertension   . Hypercholesteremia   . GERD (gastroesophageal reflux disease)   . Hyponatremia   . Asthma   . Borderline diabetes   . Seizures (HCC)     "one 5 years ago; none since as far as I know" (08/03/2013)  . Stroke [facility](HCC)     "said she had a mini stroke" (08/03/2013)  . Pneumonia 10/2012  . CKD (chronic kidney disease)   . Osteoarthrosis   . Decubitus ulcer   . RLS (restless legs syndrome)   . HLD (hyperlipidemia)   . Anxiety   . Vitamin D deficiency   . Constipation, chronic   . Conjunctivitis     Right     CURRENT MEDICATIONS: Reviewed  Patient's Medications  New Prescriptions   No medications on file  Previous Medications   AMLODIPINE (NORVASC) 5 MG TABLET    Take 5 mg by mouth daily.    ASPIRIN 81 MG CHEWABLE TABLET    Chew 81 mg by mouth every other day.   CHOLECALCIFEROL (VITAMIN D-3 PO)    Take 1,000 Units by mouth daily.   DIVALPROEX (DEPAKOTE SPRINKLE) 125 MG CAPSULE    Take two tablets = 250mg  by mouth twice daily for seizures. DO NOT CRUSH OR CHEW   DOCUSATE (COLACE) 50 MG/5ML LIQUID    Take 200 mg by mouth 2 (two) times daily.    DONEPEZIL (ARICEPT) 5 MG TABLET    Take 5 mg by mouth at bedtime.   FOLIC ACID (FOLVITE) 1 MG TABLET    Take 1 mg by mouth every other day.   HYDROCODONE-ACETAMINOPHEN (NORCO/VICODIN) 5-325 MG TABLET    Take 0.5 tablets by mouth every 4 (four) hours as needed for moderate pain (DO NOT EXCEED 4GM/24 HOURS).    LORAZEPAM (ATIVAN) 0.5 MG TABLET    Take 1 tablet (0.5 mg total) by mouth every 4 (four) hours as needed for anxiety.   LORAZEPAM (ATIVAN) 0.5 MG TABLET    Take 1/2 tablet by mouth twice daily for anxiety   LOSARTAN (COZAAR) 100 MG TABLET    Take 100 mg by mouth daily.     MEMANTINE (NAMENDA)  10 MG TABLET    Take 10 mg by mouth 2 (two) times daily.   METOPROLOL TARTRATE (LOPRESSOR) 25 MG TABLET    Take 25 mg by mouth 2 (two) times daily.   MULTIPLE VITAMINS-MINERALS (DECUBI-VITE) CAPS    Take 1 capsule by mouth daily.   PANTOPRAZOLE (PROTONIX) 20 MG TABLET    Take 20 mg by mouth daily.   PROTEIN (PROCEL PO)    Take 1 scoop by mouth 2 (two) times daily.   ROPINIROLE (REQUIP) 0.25 MG TABLET    Take 0.25 mg by mouth at bedtime.   ROSUVASTATIN (CRESTOR) 5 MG TABLET    Take 5 mg by mouth daily.   UNABLE TO FIND    Med Name: MedPass 90 mL QID  Modified Medications   No medications on file  Discontinued Medications   ERYTHROMYCIN OP    Apply 1 application to eye 4 (four) times daily. Apply a 1 cm ribbon into both eyelids QID x7 days for conjunctivitis     Allergies  Allergen Reactions  . Penicillins Other (See Comments)    Per mar     REVIEW OF SYSTEMS:  Unable to obtain due to advanced Dementia   PHYSICAL EXAMINATION  GENERAL  APPEARANCE: Well nourished. In no acute distress. Normal body habitus SKIN:  Skin is warm and dry.  HEAD: Normal in size and contour. No evidence of trauma EARS: Pinnae are normal. Patient hears normal voice tunes of the examiner MOUTH and THROAT: Lips are without lesions. Oral mucosa is moist and without lesions. Tongue is normal in shape, size, and color and without lesions NECK: supple, trachea midline, no neck masses, no thyroid tenderness, no thyromegaly LYMPHATICS: no LAN in the neck, no supraclavicular LAN RESPIRATORY: breathing is even & unlabored, BS CTAB CARDIAC: RRR, no murmur,no extra heart sounds, no edema GI: abdomen soft, normal BS, no masses, no tenderness, no hepatomegaly, no splenomegaly EXTREMITIES:  Bilateral hands are contracted; limited ROM on bilateral shoulders; generalized weakness on BLE PSYCHIATRIC:  Disoriented to time and place. Affect and behavior are appropriate  LABS/RADIOLOGY: Labs reviewed: 01/23/16  Valproic Acid 51.9 Lab Results  Component Value Date   CHOL 138 10/24/2015   HDL 51 10/24/2015   LDLCALC 74 10/24/2015   TRIG 63 10/24/2015    Basic Metabolic Panel:  Recent Labs  16/10/96 1953 08/09/15 09/22/15 10/05/15 01/23/16  NA 140 136* 142  --  137  K 4.2  --   --  4.1 4.1  CL 100*  --   --   --   --   CO2 26  --   --   --   --   GLUCOSE 130*  --   --   --   --   BUN 40* 31* 18  --  22*  CREATININE 2.01* 1.5* 0.8  --  1.2*  CALCIUM 9.4  --   --   --   --    Liver Function Tests:  Recent Labs  04/25/15 07/16/15 1953 09/22/15 01/23/16  AST 12* 23  --  13  ALT ALKPHOS 57 62 57 65  BILITOT  --  0.5  --   --   PROT  --  6.8  --   --   ALBUMIN  --  3.7  --   --    CBC:  Recent Labs  04/25/15 07/16/15 1953 08/09/15 09/22/15 01/23/16  WBC 7.7 11.7* 8.7 7.4 7.7  NEUTROABS 4  --   --   --  3  HGB 12.3 13.0 11.2* 12.5 13.0  HCT 39 39.6 35* 38 41  MCV  --  91.5  --   --   --   PLT 188 230 217 68* 204    Cardiac  Enzymes:  Recent Labs  07/16/15 1953  TROPONINI <0.03   Lipid Panel     Component Value Date/Time   CHOL 138 10/24/2015   TRIG 63 10/24/2015   HDL 51 10/24/2015   LDLCALC 74 10/24/2015      ASSESSMENT/PLAN:  Protein-calorie malnutrition - continue Procel 1 scoop PO BID and Medpass  Hypertension - well-controlled; continue Norvasc 5 mg by mouth daily, Cozaar 100 mg by mouth daily and Lopressor 25 mg by mouth twice a day   Restless leg syndrome - stable; continue Requip 0.25 mg by mouth daily at bedtime  Hyperlipidemia - continue Crestor 5 mg by mouth daily  Seizure - No seizure activity; continue Depakote 250 mg by mouth twice a day; depakote level 51.9  Vitamin D deficiency - continue vitamin D3 1000 units 1 tab by mouth daily  Constipation - continue Colace 200 mg/20 mL by mouth twice a day  GERD - continue Protonix 40 mg by mouth daily  Anxiety - mood is stable; recently decreased Ativan 0.5 mg 1/2 tab = 0.25 mg by mouth twice a day and Ativan 0.5 mg by mouth every 4 hours when necessary  Alzheimer's disease - advanced; continue Namenda 10 mg by mouth twice a day and Aricept 5 mg by mouth daily  Osteoarthrosis - continue Norco 5/325 mg 1/2 tab by mouth every 4 hours when necessary      Goals of care:  Long-term care/Hospice care   Kenard GowerMonina Medina-Vargas, NP [facility] [phone]

## 2016-03-13 ENCOUNTER — Non-Acute Institutional Stay (SKILLED_NURSING_FACILITY): Payer: Medicare HMO | Admitting: Adult Health

## 2016-03-13 ENCOUNTER — Encounter: Payer: Self-pay | Admitting: Adult Health

## 2016-03-13 DIAGNOSIS — L89152 Pressure ulcer of sacral region, stage 2: Secondary | ICD-10-CM

## 2016-03-13 DIAGNOSIS — I10 Essential (primary) hypertension: Secondary | ICD-10-CM | POA: Diagnosis not present

## 2016-03-13 DIAGNOSIS — R569 Unspecified convulsions: Secondary | ICD-10-CM | POA: Diagnosis not present

## 2016-03-13 DIAGNOSIS — M24549 Contracture, unspecified hand: Secondary | ICD-10-CM

## 2016-03-13 DIAGNOSIS — M1710 Unilateral primary osteoarthritis, unspecified knee: Secondary | ICD-10-CM

## 2016-03-13 DIAGNOSIS — E785 Hyperlipidemia, unspecified: Secondary | ICD-10-CM

## 2016-03-13 DIAGNOSIS — M179 Osteoarthritis of knee, unspecified: Secondary | ICD-10-CM

## 2016-03-13 DIAGNOSIS — F419 Anxiety disorder, unspecified: Secondary | ICD-10-CM

## 2016-03-13 DIAGNOSIS — F0280 Dementia in other diseases classified elsewhere without behavioral disturbance: Secondary | ICD-10-CM

## 2016-03-13 DIAGNOSIS — G308 Other Alzheimer's disease: Secondary | ICD-10-CM

## 2016-03-13 DIAGNOSIS — K219 Gastro-esophageal reflux disease without esophagitis: Secondary | ICD-10-CM | POA: Diagnosis not present

## 2016-03-13 DIAGNOSIS — K5900 Constipation, unspecified: Secondary | ICD-10-CM | POA: Diagnosis not present

## 2016-03-13 DIAGNOSIS — K5909 Other constipation: Secondary | ICD-10-CM

## 2016-03-13 DIAGNOSIS — E559 Vitamin D deficiency, unspecified: Secondary | ICD-10-CM

## 2016-03-13 DIAGNOSIS — E46 Unspecified protein-calorie malnutrition: Secondary | ICD-10-CM | POA: Diagnosis not present

## 2016-03-13 DIAGNOSIS — G2581 Restless legs syndrome: Secondary | ICD-10-CM | POA: Diagnosis not present

## 2016-03-13 DIAGNOSIS — IMO0002 Reserved for concepts with insufficient information to code with codable children: Secondary | ICD-10-CM

## 2016-03-13 NOTE — Progress Notes (Signed)
Patient ID: [name], female   DOB: 08/20/1930, 82 y.o.   MRN: [id number]    DATE:   03/13/16   MRN:  [id number]  BIRTHDAY: 19-Feb-1931  Facility:  Nursing Home Location:  [facility]  Nursing Home Room Number: 302-B  LEVEL OF CARE:  SNF [phone])  Contact Information    Name Relation Home Work [place] Spouse [phone]  [phone]   [name] Daughter [phone]       Code Status History    Date Active Date Inactive Code Status Order ID Comments User Context   08/03/2013  2:59 PM 08/06/2013  4:52 PM DNR [id number]  Esperanza Sheets, MD ED   08/03/2013  2:09 PM 08/03/2013  2:59 PM DNR [id number]  Esperanza Sheets, MD ED       Chief Complaint  Patient presents with  . Medical Management of Chronic Issues    HISTORY OF PRESENT ILLNESS:  This is an 82 year old female who is being seen for a routine visit. She is comfort care/hospice. She is currently having OT for her contractures on bilateral hands. She was recently started on Tylenol 650 mg BID for pain and Robaxin 500 mg  Q AM for muscle spasm.   PAST MEDICAL HISTORY:  Past Medical History:  Diagnosis Date  . Alzheimer disease    Without behavioral disturbance  . Anxiety   . Asthma   . Borderline diabetes   . CKD (chronic kidney disease)   . Conjunctivitis    Right  . Constipation, chronic   . Decubitus ulcer   . Dementia   . GERD (gastroesophageal reflux disease)   . HLD (hyperlipidemia)   . Hypercholesteremia   . Hypertension   . Hyponatremia   . Osteoarthrosis   . Pneumonia 10/2012  . RLS (restless legs syndrome)   . Seizures (HCC)    "one 5 years ago; none since as far as I know" (08/03/2013)  . Stroke [facility])    "said she had a mini stroke" (08/03/2013)  . Vitamin D deficiency      CURRENT MEDICATIONS: Reviewed  Patient's Medications  New Prescriptions   No medications on file  Previous Medications   ACETAMINOPHEN (TYLENOL) 325 MG  TABLET    Take by mouth 2 (two) times daily.   AMLODIPINE (NORVASC) 5 MG TABLET    Take 5 mg by mouth daily.   ASPIRIN 81 MG CHEWABLE TABLET    Chew 81 mg by mouth every other day.   CHOLECALCIFEROL (VITAMIN D-3 PO)    Take 1,000 Units by mouth daily.   DIVALPROEX (DEPAKOTE SPRINKLE) 125 MG CAPSULE    Take two tablets =  by mouth twice daily for seizures. DO NOT CRUSH OR CHEW   DOCUSATE (COLACE) 50 MG/5ML LIQUID    Take 200 mg by mouth 2 (two) times daily.    DONEPEZIL (ARICEPT) 5 MG TABLET    Take 5 mg by mouth at bedtime.    FOLIC ACID (FOLVITE) 1 MG TABLET    Take 1 mg by mouth every other day.   HYDROCODONE-ACETAMINOPHEN (NORCO/VICODIN) 5-325 MG TABLET    Take 0.5 tablets by mouth every 4 (four) hours as needed for moderate pain (DO NOT EXCEED 4GM/24 HOURS).    LORAZEPAM (ATIVAN) 0.5 MG TABLET    Take 1 tablet (0.5 mg total) by mouth every 4 (four) hours as needed for anxiety.   LORAZEPAM (ATIVAN) 0.5 MG TABLET    Take 1/2 tablet by mouth  twice daily for anxiety   LOSARTAN (COZAAR) 100 MG TABLET    Take 100 mg by mouth daily.    MEMANTINE (NAMENDA) 10 MG TABLET    Take 10 mg by mouth 2 (two) times daily.    METHOCARBAMOL (ROBAXIN) 500 MG TABLET    Take 500 mg by mouth daily.   METOPROLOL TARTRATE (LOPRESSOR) 25 MG TABLET    Take 25 mg by mouth 2 (two) times daily.   MULTIPLE VITAMINS-MINERALS (DECUBI-VITE) CAPS    Take 1 capsule by mouth daily.   PANTOPRAZOLE (PROTONIX) 20 MG TABLET    Take 20 mg by mouth daily.   PROTEIN (PROCEL PO)    Take 1 scoop by mouth 2 (two) times daily.   ROPINIROLE (REQUIP) 0.25 MG TABLET    Take 0.25 mg by mouth at bedtime.    ROSUVASTATIN (CRESTOR) 5 MG TABLET    Take 5 mg by mouth daily.   UNABLE TO FIND    Med Name: MedPass 90 mL QID  Modified Medications   No medications on file  Discontinued Medications   No medications on file     Allergies  Allergen Reactions  . Penicillins Other (See Comments)    Per mar     REVIEW OF SYSTEMS:  Unable to  obtain due to advanced Dementia   PHYSICAL EXAMINATION  GENERAL APPEARANCE: Well nourished. In no acute distress. Normal body habitus SKIN:  Pressure ulcer on coccyx stage 2 covered with dry dressing  HEAD: Normal in size and contour. No evidence of trauma EARS: Pinnae are normal. Patient hears normal voice tunes of the examiner MOUTH and THROAT: Lips are without lesions. Oral mucosa is moist and without lesions. Tongue is normal in shape, size, and color and without lesions NECK: supple, trachea midline, no neck masses, no thyroid tenderness, no thyromegaly LYMPHATICS: no LAN in the neck, no supraclavicular LAN RESPIRATORY: breathing is even & unlabored, BS CTAB CARDIAC: RRR, no murmur,no extra heart sounds, no edema GI: abdomen soft, normal BS, no masses, no tenderness, no hepatomegaly, no splenomegaly EXTREMITIES:  Bilateral hands are contracted; limited ROM on bilateral shoulders; generalized weakness on BLE PSYCHIATRIC:  Disoriented to time and place. Affect and behavior are appropriate  LABS/RADIOLOGY: Labs reviewed: 01/23/16  Valproic Acid 51.9 Lab Results  Component Value Date   CHOL 138 10/24/2015   HDL 51 10/24/2015   LDLCALC 74 10/24/2015   TRIG 63 10/24/2015    Basic Metabolic Panel:  Recent Labs  16/10/96 1953 08/09/15 09/22/15 10/05/15 01/23/16  NA 140 136* 142  --  137  K 4.2  --   --  4.1 4.1  CL 100*  --   --   --   --   CO2 26  --   --   --   --   GLUCOSE 130*  --   --   --   --   BUN 40* 31* 18  --  22*  CREATININE 2.01* 1.5* 0.8  --  1.2*  CALCIUM 9.4  --   --   --   --    Liver Function Tests:  Recent Labs  04/25/15 07/16/15 1953 09/22/15 01/23/16  AST 12* 23  --  13  ALT 8 16 9 11   ALKPHOS 57 62 57 65  BILITOT  --  0.5  --   --   PROT  --  6.8  --   --   ALBUMIN  --  3.7  --   --  CBC:  Recent Labs  04/25/15 07/16/15 1953 08/09/15 09/22/15 01/23/16  WBC 7.7 11.7* 8.7 7.4 7.7  NEUTROABS 4  --   --   --  3  HGB 12.3 13.0 11.2* 12.5  13.0  HCT 39 39.6 35* 38 41  MCV  --  91.5  --   --   --   PLT 188 230 217 68* 204    Cardiac Enzymes:  Recent Labs  07/16/15 1953  TROPONINI <0.03   Lipid Panel     Component Value Date/Time   CHOL 138 10/24/2015   TRIG 63 10/24/2015   HDL 51 10/24/2015   LDLCALC 74 10/24/2015      ASSESSMENT/PLAN:  Protein-calorie malnutrition - continue Procel 1 scoop PO BID and Medpass 90 ml QID  Hypertension - well-controlled; continue Norvasc 5 mg by mouth daily, Cozaar 100 mg by mouth daily and Lopressor 25 mg by mouth twice a day   Restless leg syndrome - stable; continue Requip 0.25 mg by mouth daily at bedtime  Hyperlipidemia - continue Crestor 5 mg by mouth daily  Seizure - No seizure activity; continue Depakote 250 mg by mouth twice a day 01/23/16  depakote level 51.9  Vitamin D deficiency - continue vitamin D3 1000 units 1 tab by mouth daily  Constipation - continue Colace 200 mg/20 mL by mouth twice a day  GERD - continue Protonix 40 mg by mouth daily  Anxiety - mood is stable; continue Ativan 0.5 mg 1/2 tab = 0.25 mg by mouth twice a day and Ativan 0.5 mg by mouth every 4 hours when necessary  Alzheimer's disease - advanced; continue Namenda 10 mg by mouth twice a day and Aricept 5 mg by mouth daily  Osteoarthrosis - continue Norco 5/325 mg 1/2 tab by mouth every 4 hours when necessary   Bilateral hand contractures - recently started on Tylenol 650 mg BID for pain and Robaxin 500 mg daily for muscle spasm; continue OT for therapeutic exercises  Pressure ulcer on coccyx, stage 2 - continue wound treatment daily, medpass, decubivite daily and procel      Goals of care:  Long-term care/Hospice care   Kenard Gower, NP [facility] [phone]

## 2016-04-12 ENCOUNTER — Encounter: Payer: Self-pay | Admitting: Adult Health

## 2016-04-12 NOTE — Progress Notes (Signed)
Patient ID: [name], female   DOB: April 04, 1931, 82 y.o.   MRN: [id number]   This encounter was created in error - please disregard.

## 2016-04-19 ENCOUNTER — Encounter: Payer: Self-pay | Admitting: Internal Medicine

## 2016-04-19 ENCOUNTER — Non-Acute Institutional Stay (SKILLED_NURSING_FACILITY): Payer: Medicare HMO | Admitting: Internal Medicine

## 2016-04-19 DIAGNOSIS — I10 Essential (primary) hypertension: Secondary | ICD-10-CM

## 2016-04-19 DIAGNOSIS — M24541 Contracture, right hand: Secondary | ICD-10-CM

## 2016-04-19 DIAGNOSIS — R569 Unspecified convulsions: Secondary | ICD-10-CM | POA: Diagnosis not present

## 2016-04-19 DIAGNOSIS — Z9189 Other specified personal risk factors, not elsewhere classified: Secondary | ICD-10-CM

## 2016-04-19 DIAGNOSIS — N183 Chronic kidney disease, stage 3 unspecified: Secondary | ICD-10-CM

## 2016-04-19 DIAGNOSIS — E785 Hyperlipidemia, unspecified: Secondary | ICD-10-CM

## 2016-04-19 NOTE — Progress Notes (Signed)
Patient ID: [name], female   DOB: November 17, 1930, 82 y.o.   MRN: [id number]     [facility]   PCP: Oneal Grout, MD  Code Status: DNR  Allergies  Allergen Reactions  . Penicillins Other (See Comments)    Per mar    Chief Complaint  Patient presents with  . Medical Management of Chronic Issues    Routine Visit     HPI:  82 year old patient is seen for routine visit. She requires 2 person assist with transfers. She is under total care. She does not participate in HPI and ROS. She has been at her baseline per nursing staff.   Review of Systems:  Unable to obtain with her dementia. She requires assistance with feeding. No fall reported. No pressure ulcer reported. Her po intake has been fair.      Past Medical History:  Diagnosis Date  . Alzheimer disease    Without behavioral disturbance  . Anxiety   . Asthma   . Borderline diabetes   . CKD (chronic kidney disease)   . Conjunctivitis    Right  . Constipation, chronic   . Decubitus ulcer   . Dementia   . GERD (gastroesophageal reflux disease)   . HLD (hyperlipidemia)   . Hypercholesteremia   . Hypertension   . Hyponatremia   . Osteoarthrosis   . Pneumonia 10/2012  . RLS (restless legs syndrome)   . Seizures (HCC)    "one 5 years ago; none since as far as I know" (08/03/2013)  . Stroke [facility])    "said she had a mini stroke" (08/03/2013)  . Vitamin D deficiency     Medications: Patient's Medications  New Prescriptions   No medications on file  Previous Medications   ACETAMINOPHEN (TYLENOL) 325 MG TABLET    Take 650 mg by mouth 2 (two) times daily.    AMLODIPINE (NORVASC) 5 MG TABLET    Take 5 mg by mouth daily.    ASPIRIN 81 MG CHEWABLE TABLET    Chew 81 mg by mouth every other day.   CHOLECALCIFEROL (VITAMIN D-3 PO)    Take 1,000 Units by mouth daily.   DIVALPROEX (DEPAKOTE SPRINKLE) 125 MG CAPSULE    Take two tablets = 250mg  by mouth twice daily for seizures. DO NOT  CRUSH OR CHEW   DOCUSATE (COLACE) 50 MG/5ML LIQUID    Take 200 mg by mouth 2 (two) times daily.    DONEPEZIL (ARICEPT) 5 MG TABLET    Take 5 mg by mouth at bedtime.    FOLIC ACID (FOLVITE) 1 MG TABLET    Take 1 mg by mouth every other day.   HYDROCODONE-ACETAMINOPHEN (NORCO/VICODIN) 5-325 MG TABLET    Take 0.5 tablets by mouth every 4 (four) hours as needed for moderate pain (DO NOT EXCEED 4GM/24 HOURS).    LORAZEPAM (ATIVAN) 0.5 MG TABLET    Take 1 tablet (0.5 mg total) by mouth every 4 (four) hours as needed for anxiety.   LORAZEPAM (ATIVAN) 0.5 MG TABLET    Take 1/2 tablet by mouth twice daily for anxiety   LOSARTAN (COZAAR) 100 MG TABLET    Take 100 mg by mouth daily.    MEMANTINE (NAMENDA) 10 MG TABLET    Take 10 mg by mouth 2 (two) times daily.    METOPROLOL TARTRATE (LOPRESSOR) 25 MG TABLET    Take 25 mg by mouth 2 (two) times daily.   MULTIPLE VITAMINS-MINERALS (DECUBI-VITE) CAPS  Take 1 capsule by mouth daily.   PANTOPRAZOLE (PROTONIX) 20 MG TABLET    Take 20 mg by mouth daily.   PROTEIN (PROCEL PO)    Take 1 scoop by mouth 2 (two) times daily.    ROPINIROLE (REQUIP) 0.25 MG TABLET    Take 0.25 mg by mouth at bedtime.    ROSUVASTATIN (CRESTOR) 5 MG TABLET    Take 5 mg by mouth daily.   TIZANIDINE (ZANAFLEX) 2 MG CAPSULE    Take 2 mg by mouth daily.   UNABLE TO FIND    Med Name: MedPass 90 mL QID  Modified Medications   No medications on file  Discontinued Medications   No medications on file     Physical Exam: Vitals:   04/19/16 1122  BP: (!) 144/81  Pulse: 61  Resp: 20  Temp: 97.4 F (36.3 C)  TempSrc: Oral  SpO2: 97%  Weight: 114 lb 3.2 oz (51.8 kg)  Height: 5\' 2"  (1.575 m)   Wt Readings from Last 3 Encounters:  04/19/16 114 lb 3.2 oz (51.8 kg)  04/12/16 114 lb 3.2 oz (51.8 kg)  03/13/16 121 lb (54.9 kg)   Body mass index is 20.89 kg/m.   Constitutional: Frail, elderly patient, thin built Head: Normocephalic and atraumatic.   Mouth: MMM Eyes: Pupils are  equal, round, and reactive to light.  Neck: Normal range of motion. Neck supple.  Cardiovascular: Normal s1, s2, no murmur Pulmonary/Chest: CTAB Abdominal: Soft. Bowel sounds are normal.  Musculoskeletal: no leg edema, contractures noted to both hands, old surgical scar to both knees, generalized weakness to both upper and lower extremities, soft brace to her hands, under total care, wheelchair has cushion Neurological: alert but not oriented Skin: warm and dry.  Psychiatric: normal mood and affect.    Labs reviewed: CBC Latest Ref Rng & Units 01/23/2016 09/22/2015 10/10/202016  WBC 10:3/mL 7.7 7.4 8.7  Hemoglobin 12.0 - 16.0 g/dL 16.113.0 09.612.5 11.2(A)  Hematocrit 36 - 46 % 41 38 35(A)  Platelets 150 - 399 K/L 204 68(A) 217   CMP Latest Ref Rng & Units 01/23/2016 10/05/2015 09/22/2015  Glucose 65 - 99 mg/dL - - -  BUN 4 - 21 mg/dL 04(V22(A) - 18  Creatinine 0.5 - 1.1 mg/dL 1.2(A) - 0.8  Sodium 137 - 147 mmol/L 137 - 142  Potassium 3.4 - 5.3 mmol/L 4.1 4.1 -  Chloride 101 - 111 mmol/L - - -  CO2 22 - 32 mmol/L - - -  Calcium 8.9 - 10.3 mg/dL - - -  Total Protein 6.5 - 8.1 g/dL - - -  Total Bilirubin 0.3 - 1.2 mg/dL - - -  Alkaline Phos 25 - 125 U/L 65 - 57  AST 13 - 35 U/L 13 - -  ALT 7 - 35 U/L 11 - 9   Lipid Panel     Component Value Date/Time   CHOL 138 10/24/2015   TRIG 63 10/24/2015   HDL 51 10/24/2015   LDLCALC 74 10/24/2015   01/23/16 valproic acid 51.9  Foot Exam Completed: 09/08/15 Eye Exam: Patient refused/combative   Assessment/Plan  Seizures Continue depakote 250 mg bid, reviewed valproic acid level. Remains seizure free.  HTN Elevated bp reading noted for today. Review of her weekly BP over this and last month is 98-127/61-98. Currently on amlodipine 5 mg daily, losartan 100 mg daily, metoprolol 25 mg bid. Continue to monitor bp. With most of her reading < 140/90, no changes made.   Hyperlipidemia Continue rosuvastatin.  LDL at goal.  Contracture to hand Continue  zanaflex 2 mg daily. Currently on norco 5-325 mg half a tab q4h prn pain and tylenol 650 mg bid. Has not required her norco. D/c norco for now. Start tylenol 500 mg q8h prn for severe pain and reassess if needed for further pain management  ckd stage 3 Monitor bmp  At risk for pressure ulcer Had Sacral stage 2 pressure ulcer that has resolved. High risk for pressure ulcer with her limited mobility. Continue pressure ulcer prophylaxis. Continue decubivite.    Oneal Grout, MD Internal Medicine [facility] [address] [place], [place] [address] Cell Phone (Monday-Friday 8 am - 5 pm): [phone] On Call: [phone] and follow prompts after 5 pm and on weekends Office Phone: [phone] Office Fax: [phone]

## 2016-05-14 ENCOUNTER — Encounter: Payer: Self-pay | Admitting: Adult Health

## 2016-05-14 ENCOUNTER — Non-Acute Institutional Stay (SKILLED_NURSING_FACILITY): Payer: Medicare HMO | Admitting: Adult Health

## 2016-05-14 DIAGNOSIS — I10 Essential (primary) hypertension: Secondary | ICD-10-CM

## 2016-05-14 DIAGNOSIS — M24549 Contracture, unspecified hand: Secondary | ICD-10-CM | POA: Diagnosis not present

## 2016-05-14 DIAGNOSIS — R569 Unspecified convulsions: Secondary | ICD-10-CM | POA: Diagnosis not present

## 2016-05-14 DIAGNOSIS — G2581 Restless legs syndrome: Secondary | ICD-10-CM

## 2016-05-14 DIAGNOSIS — K5909 Other constipation: Secondary | ICD-10-CM | POA: Diagnosis not present

## 2016-05-14 DIAGNOSIS — E785 Hyperlipidemia, unspecified: Secondary | ICD-10-CM | POA: Diagnosis not present

## 2016-05-14 DIAGNOSIS — F419 Anxiety disorder, unspecified: Secondary | ICD-10-CM

## 2016-05-14 DIAGNOSIS — F0280 Dementia in other diseases classified elsewhere without behavioral disturbance: Secondary | ICD-10-CM | POA: Diagnosis not present

## 2016-05-14 DIAGNOSIS — G308 Other Alzheimer's disease: Secondary | ICD-10-CM | POA: Diagnosis not present

## 2016-05-14 DIAGNOSIS — E441 Mild protein-calorie malnutrition: Secondary | ICD-10-CM

## 2016-05-14 DIAGNOSIS — K219 Gastro-esophageal reflux disease without esophagitis: Secondary | ICD-10-CM | POA: Diagnosis not present

## 2016-05-14 NOTE — Progress Notes (Signed)
Patient ID: [name], female   DOB: 22-Apr-1931, 82 y.o.   MRN: [id number]    DATE:   05/14/16  MRN:  [id number]  BIRTHDAY: 22-Apr-1931  Facility:  Nursing Home Location:  [facility]  Nursing Home Room Number: 302-B  LEVEL OF CARE:  SNF [phone])  Contact Information    Name Relation Home Work [employer]   Moronta,Ezra Spouse [phone]  [phone]   Josephine IgoJarrell,Paula Daughter [phone]       Code Status History    Date Active Date Inactive Code Status Order ID Comments User Context   08/03/2013  2:59 PM 08/06/2013  4:52 PM DNR [id number]  Esperanza SheetsUlugbek N Buriev, MD ED   08/03/2013  2:09 PM 08/03/2013  2:59 PM DNR [id number]  Esperanza SheetsUlugbek N Buriev, MD ED       Chief Complaint  Patient presents with  . Medical Management of Chronic Issues    HISTORY OF PRESENT ILLNESS:  This is an 82 year old female who is being seen for a routine visit. She is comfort care/hospice. No seizure episode and continues to take  Depakote. BPs has been stable - 130/81, 122/95, 129/85 and 126/72    PAST MEDICAL HISTORY:  Past Medical History:  Diagnosis Date  . Alzheimer disease    Without behavioral disturbance  . Anxiety   . Asthma   . Borderline diabetes   . CKD (chronic kidney disease)   . Conjunctivitis    Right  . Constipation, chronic   . Decubitus ulcer   . Dementia   . GERD (gastroesophageal reflux disease)   . HLD (hyperlipidemia)   . Hypercholesteremia   . Hypertension   . Hyponatremia   . Osteoarthrosis   . Pneumonia 10/2012  . RLS (restless legs syndrome)   . Seizures (HCC)    "one 5 years ago; none since as far as I know" (08/03/2013)  . Stroke [facility](HCC)    "said she had a mini stroke" (08/03/2013)  . Vitamin D deficiency      CURRENT MEDICATIONS: Reviewed  Patient's Medications  New Prescriptions   No medications on file  Previous Medications   ACETAMINOPHEN (TYLENOL) 325 MG TABLET    Take 650 mg by mouth 2 (two) times daily.    AMLODIPINE (NORVASC) 5 MG TABLET    Take 5 mg by mouth daily.    ASPIRIN 81 MG CHEWABLE TABLET    Chew 81 mg by mouth every other day.   CHOLECALCIFEROL (VITAMIN D-3 PO)    Take 1,000 Units by mouth daily.   DIVALPROEX (DEPAKOTE SPRINKLE) 125 MG CAPSULE    Take two tablets = 250mg  by mouth twice daily for seizures. DO NOT CRUSH OR CHEW   DOCUSATE (COLACE) 50 MG/5ML LIQUID    Take 200 mg by mouth 2 (two) times daily.    DONEPEZIL (ARICEPT) 5 MG TABLET    Take 5 mg by mouth at bedtime.    FOLIC ACID (FOLVITE) 1 MG TABLET    Take 1 mg by mouth every other day.   LORAZEPAM (ATIVAN) 0.5 MG TABLET    Take 1 tablet (0.5 mg total) by mouth every 4 (four) hours as needed for anxiety.   LORAZEPAM (ATIVAN) 0.5 MG TABLET    Take 1/2 tablet by mouth twice daily for anxiety   LOSARTAN (COZAAR) 100 MG TABLET    Take 100 mg by mouth daily.    MEMANTINE (NAMENDA) 10 MG TABLET    Take 10 mg by mouth 2 (two) times daily.  METOPROLOL TARTRATE (LOPRESSOR) 25 MG TABLET    Take 25 mg by mouth 2 (two) times daily.   MULTIPLE VITAMINS-MINERALS (DECUBI-VITE) CAPS    Take 1 capsule by mouth daily.   PANTOPRAZOLE (PROTONIX) 20 MG TABLET    Take 20 mg by mouth daily.   PROTEIN (PROCEL PO)    Take 1 scoop by mouth 2 (two) times daily.    ROPINIROLE (REQUIP) 0.25 MG TABLET    Take 0.25 mg by mouth at bedtime.    ROSUVASTATIN (CRESTOR) 5 MG TABLET    Take 5 mg by mouth daily.   TIZANIDINE (ZANAFLEX) 2 MG CAPSULE    Take 2 mg by mouth daily.   UNABLE TO FIND    Med Name: MedPass 90 mL QID  Modified Medications   No medications on file  Discontinued Medications   HYDROCODONE-ACETAMINOPHEN (NORCO/VICODIN) 5-325 MG TABLET    Take 0.5 tablets by mouth every 4 (four) hours as needed for moderate pain (DO NOT EXCEED 4GM/24 HOURS).      Allergies  Allergen Reactions  . Penicillins Other (See Comments)    Per mar     REVIEW OF SYSTEMS:  Unable to obtain due to advanced Dementia   PHYSICAL EXAMINATION  GENERAL  APPEARANCE: Well nourished. In no acute distress. Normal body habitus HEAD: Normal in size and contour. No evidence of trauma EARS: Pinnae are normal. Patient hears normal voice tunes of the examiner MOUTH and THROAT: Lips are without lesions. Oral mucosa is moist and without lesions. Tongue is normal in shape, size, and color and without lesions NECK: supple, trachea midline, no neck masses, no thyroid tenderness, no thyromegaly LYMPHATICS: no LAN in the neck, no supraclavicular LAN RESPIRATORY: breathing is even & unlabored, BS CTAB CARDIAC: RRR, no murmur,no extra heart sounds, no edema GI: abdomen soft, normal BS, no masses, no tenderness, no hepatomegaly, no splenomegaly EXTREMITIES:  Bilateral hands are contracted; limited ROM on bilateral shoulders; generalized weakness on BLE PSYCHIATRIC:  Disoriented to time and place. Affect and behavior are appropriate  LABS/RADIOLOGY: Labs reviewed: 01/23/16  Valproic Acid 51.9 Lab Results  Component Value Date   CHOL 138 10/24/2015   HDL 51 10/24/2015   LDLCALC 74 10/24/2015   TRIG 63 10/24/2015    Basic Metabolic Panel:  Recent Labs  16/10/96 1953 08/09/15 09/22/15 10/05/15 01/23/16  NA 140 136* 142  --  137  K 4.2  --   --  4.1 4.1  CL 100*  --   --   --   --   CO2 26  --   --   --   --   GLUCOSE 130*  --   --   --   --   BUN 40* 31* 18  --  22*  CREATININE 2.01* 1.5* 0.8  --  1.2*  CALCIUM 9.4  --   --   --   --    Liver Function Tests:  Recent Labs  07/16/15 1953 09/22/15 01/23/16  AST 23  --  13  ALT 16 9 11   ALKPHOS 62 57 65  BILITOT 0.5  --   --   PROT 6.8  --   --   ALBUMIN 3.7  --   --    CBC:  Recent Labs  07/16/15 1953 08/09/15 09/22/15 01/23/16  WBC 11.7* 8.7 7.4 7.7  NEUTROABS  --   --   --  3  HGB 13.0 11.2* 12.5 13.0  HCT 39.6 35* 38 41  MCV 91.5  --   --   --  PLT 230 217 68* 204    Cardiac Enzymes:  Recent Labs  07/16/15 1953  TROPONINI <0.03   Lipid Panel     Component Value  Date/Time   CHOL 138 10/24/2015   TRIG 63 10/24/2015   HDL 51 10/24/2015   LDLCALC 74 10/24/2015      ASSESSMENT/PLAN:  Protein-calorie malnutrition - continue Procel 1 scoop PO BID and Medpass 90 ml QID  Hypertension - well-controlled; continue Norvasc 5 mg by mouth daily, Cozaar 100 mg by mouth daily and Lopressor 25 mg by mouth twice a day   Restless leg syndrome - stable; continue Requip 0.25 mg by mouth daily at bedtime  Hyperlipidemia - continue Crestor 5 mg by mouth daily  Seizure - No seizure activity; continue Depakote 250 mg by mouth twice a day 01/23/16  depakote level 51.9  Constipation - continue Colace 200 mg/20 mL by mouth twice a day  GERD - continue Protonix 40 mg by mouth daily  Anxiety - mood is stable; continue Ativan 0.5 mg 1/2 tab = 0.25 mg by mouth twice a day and Ativan 0.5 mg by mouth every 4 hours when necessary  Alzheimer's disease - advanced; continue Namenda 10 mg by mouth twice a day and Aricept 5 mg by mouth daily   Bilateral hand contractures - continue Tylenol 650 mg BID for pain and Zanaflex 2 mg 1 capsule  PO Q D for muscle spasm      Goals of care:  Long-term care/Hospice care   Kenard Gower, NP [facility] [phone]

## 2016-06-18 ENCOUNTER — Encounter: Payer: Self-pay | Admitting: Adult Health

## 2016-06-18 ENCOUNTER — Non-Acute Institutional Stay (SKILLED_NURSING_FACILITY): Payer: Medicare HMO | Admitting: Adult Health

## 2016-06-18 DIAGNOSIS — E441 Mild protein-calorie malnutrition: Secondary | ICD-10-CM

## 2016-06-18 DIAGNOSIS — K5909 Other constipation: Secondary | ICD-10-CM | POA: Diagnosis not present

## 2016-06-18 DIAGNOSIS — M24549 Contracture, unspecified hand: Secondary | ICD-10-CM

## 2016-06-18 DIAGNOSIS — R569 Unspecified convulsions: Secondary | ICD-10-CM

## 2016-06-18 DIAGNOSIS — G2581 Restless legs syndrome: Secondary | ICD-10-CM | POA: Diagnosis not present

## 2016-06-18 DIAGNOSIS — I10 Essential (primary) hypertension: Secondary | ICD-10-CM

## 2016-06-18 DIAGNOSIS — K219 Gastro-esophageal reflux disease without esophagitis: Secondary | ICD-10-CM | POA: Diagnosis not present

## 2016-06-18 DIAGNOSIS — E785 Hyperlipidemia, unspecified: Secondary | ICD-10-CM

## 2016-06-18 DIAGNOSIS — F419 Anxiety disorder, unspecified: Secondary | ICD-10-CM | POA: Diagnosis not present

## 2016-06-18 DIAGNOSIS — F0280 Dementia in other diseases classified elsewhere without behavioral disturbance: Secondary | ICD-10-CM

## 2016-06-18 DIAGNOSIS — R1310 Dysphagia, unspecified: Secondary | ICD-10-CM | POA: Diagnosis not present

## 2016-06-18 DIAGNOSIS — G308 Other Alzheimer's disease: Secondary | ICD-10-CM | POA: Diagnosis not present

## 2016-06-18 NOTE — Progress Notes (Signed)
Patient ID: [name], female   DOB: [birth year], 82 y.o.   MRN: [id number]    DATE:   06/18/16  MRN:  [id number]  BIRTHDAY: Mar 13, 1931  Facility:  Nursing Home Location:  [facility]  Nursing Home Room Number: 302-B  LEVEL OF CARE:  SNF [phone])  Contact Information    Name Relation Home Work [place] Spouse [phone]  [phone]   [name] Daughter [phone]       Code Status History    Date Active Date Inactive Code Status Order ID Comments User Context   08/03/2013  2:59 PM 08/06/2013  4:52 PM DNR [id number]  Esperanza Sheets, MD ED   08/03/2013  2:09 PM 08/03/2013  2:59 PM DNR [id number]  Esperanza Sheets, MD ED       Chief Complaint  Patient presents with  . Medical Management of Chronic Issues    HISTORY OF PRESENT ILLNESS:  This is an 82 year old female who is being seen for a routine visit. She is comfort care/hospice. She is currently having ST treatment for dysphagia.     PAST MEDICAL HISTORY:  Past Medical History:  Diagnosis Date  . Alzheimer disease    Without behavioral disturbance  . Anxiety   . Asthma   . Borderline diabetes   . CKD (chronic kidney disease)   . Conjunctivitis    Right  . Constipation, chronic   . Decubitus ulcer   . Dementia   . GERD (gastroesophageal reflux disease)   . HLD (hyperlipidemia)   . Hypercholesteremia   . Hypertension   . Hyponatremia   . Osteoarthrosis   . Pneumonia 10/2012  . RLS (restless legs syndrome)   . Seizures (HCC)    "one 5 years ago; none since as far as I know" (08/03/2013)  . Stroke [facility])    "said she had a mini stroke" (08/03/2013)  . Vitamin D deficiency      CURRENT MEDICATIONS: Reviewed  Patient's Medications  New Prescriptions   No medications on file  Previous Medications   ACETAMINOPHEN (TYLENOL) 325 MG TABLET    Take 650 mg by mouth 2 (two) times daily.    AMLODIPINE (NORVASC) 5 MG TABLET    Take 5 mg by mouth  daily.    ASPIRIN 81 MG CHEWABLE TABLET    Chew 81 mg by mouth every other day.   CHOLECALCIFEROL (VITAMIN D-3 PO)    Take 1,000 Units by mouth daily.   DIVALPROEX (DEPAKOTE SPRINKLE) 125 MG CAPSULE    Take two tablets = 250mg  by mouth twice daily for seizures. DO NOT CRUSH OR CHEW   DOCUSATE (COLACE) 50 MG/5ML LIQUID    Take 200 mg by mouth 2 (two) times daily.    DONEPEZIL (ARICEPT) 5 MG TABLET    Take 5 mg by mouth at bedtime.    FOLIC ACID (FOLVITE) 1 MG TABLET    Take 1 mg by mouth every other day.   LORAZEPAM (ATIVAN) 0.5 MG TABLET    Take 1 tablet (0.5 mg total) by mouth every 4 (four) hours as needed for anxiety.   LORAZEPAM (ATIVAN) 0.5 MG TABLET    Take 1/2 tablet by mouth twice daily for anxiety   LOSARTAN (COZAAR) 100 MG TABLET    Take 100 mg by mouth daily.    MEMANTINE (NAMENDA) 10 MG TABLET    Take 10 mg by mouth 2 (two) times daily.    METOPROLOL TARTRATE (LOPRESSOR) 25 MG TABLET  Take 25 mg by mouth 2 (two) times daily.   MULTIPLE VITAMINS-MINERALS (DECUBI-VITE) CAPS    Take 1 capsule by mouth daily.   PANTOPRAZOLE (PROTONIX) 20 MG TABLET    Take 20 mg by mouth daily.   PROTEIN (PROCEL PO)    Take 1 scoop by mouth 2 (two) times daily.    ROPINIROLE (REQUIP) 0.25 MG TABLET    Take 0.25 mg by mouth at bedtime.    ROSUVASTATIN (CRESTOR) 5 MG TABLET    Take 5 mg by mouth daily.   TIZANIDINE (ZANAFLEX) 2 MG CAPSULE    Take 2 mg by mouth daily.   UNABLE TO FIND    Med Name: MedPass 90 mL QID  Modified Medications   No medications on file  Discontinued Medications   No medications on file     Allergies  Allergen Reactions  . Penicillins Other (See Comments)    Per mar     REVIEW OF SYSTEMS:  Unable to obtain due to advanced Dementia   PHYSICAL EXAMINATION  GENERAL APPEARANCE: Well nourished. In no acute distress. Normal body habitus HEAD: Normal in size and contour. No evidence of trauma EARS: Pinnae are normal. Patient hears normal voice tunes of the  examiner MOUTH and THROAT: Lips are without lesions. Oral mucosa is moist and without lesions. Tongue is normal in shape, size, and color and without lesions NECK: supple, trachea midline, no neck masses, no thyroid tenderness, no thyromegaly LYMPHATICS: no LAN in the neck, no supraclavicular LAN RESPIRATORY: breathing is even & unlabored, BS CTAB CARDIAC: RRR, no murmur,no extra heart sounds, no edema GI: abdomen soft, normal BS, no masses, no tenderness, no hepatomegaly, no splenomegaly EXTREMITIES:  Bilateral hands are contracted; limited ROM on bilateral shoulders; generalized weakness on BLE PSYCHIATRIC:  Disoriented to time and place. Affect and behavior are appropriate  LABS/RADIOLOGY: Labs reviewed: 01/23/16  Valproic Acid 51.9 Lab Results  Component Value Date   CHOL 138 10/24/2015   HDL 51 10/24/2015   LDLCALC 74 10/24/2015   TRIG 63 10/24/2015    Basic Metabolic Panel:  Recent Labs  16/05/9611/04/16 1953 08/09/15 09/22/15 10/05/15 01/23/16  NA 140 136* 142  --  137  K 4.2  --   --  4.1 4.1  CL 100*  --   --   --   --   CO2 26  --   --   --   --   GLUCOSE 130*  --   --   --   --   BUN 40* 31* 18  --  22*  CREATININE 2.01* 1.5* 0.8  --  1.2*  CALCIUM 9.4  --   --   --   --    Liver Function Tests:  Recent Labs  07/16/15 1953 09/22/15 01/23/16  AST 23  --  13  ALT 16 9 11   ALKPHOS 62 57 65  BILITOT 0.5  --   --   PROT 6.8  --   --   ALBUMIN 3.7  --   --    CBC:  Recent Labs  07/16/15 1953 08/09/15 09/22/15 01/23/16  WBC 11.7* 8.7 7.4 7.7  NEUTROABS  --   --   --  3  HGB 13.0 11.2* 12.5 13.0  HCT 39.6 35* 38 41  MCV 91.5  --   --   --   PLT 230 217 68* 204    Cardiac Enzymes:  Recent Labs  07/16/15 1953  TROPONINI <0.03   Lipid Panel  Component Value Date/Time   CHOL 138 10/24/2015   TRIG 63 10/24/2015   HDL 51 10/24/2015   LDLCALC 74 10/24/2015      ASSESSMENT/PLAN:  Dysphagia - continue ST treatment ; aspiration  precaution  Protein-calorie malnutrition - continue Procel 1 scoop PO BID and Medpass 90 ml QID  Hypertension - well-controlled; continue Norvasc 5 mg by mouth daily, Cozaar 100 mg by mouth daily and Lopressor 25 mg by mouth twice a day   Restless leg syndrome - stable; continue Requip 0.25 mg by mouth daily at bedtime   Hyperlipidemia - continue Crestor 5 mg by mouth daily  Seizure - No seizure activity; continue Depakote 250 mg by mouth twice a day 01/23/16  depakote level 51.9  Constipation - continue Colace 200 mg/20 mL by mouth twice a day  GERD - continue Protonix 40 mg by mouth daily  Anxiety - mood is stable; continue Ativan 0.5 mg 1/2 tab = 0.25 mg by mouth twice a day and Ativan 0.5 mg by mouth every 4 hours when necessary  Alzheimer's disease - advanced; continue Namenda 10 mg by mouth twice a day and Aricept 5 mg by mouth daily   Bilateral hand contractures - continue Tylenol 650 mg BID for pain and Zanaflex 2 mg 1 capsule  PO Q D for muscle spasm      Goals of care:  Long-term care/Hospice care   Kenard GowerMonina Medina-Vargas, NP [facility] [phone]

## 2016-07-18 ENCOUNTER — Encounter: Payer: Self-pay | Admitting: Adult Health

## 2016-07-18 ENCOUNTER — Non-Acute Institutional Stay (SKILLED_NURSING_FACILITY): Payer: Medicare HMO | Admitting: Adult Health

## 2016-07-18 DIAGNOSIS — F419 Anxiety disorder, unspecified: Secondary | ICD-10-CM

## 2016-07-18 DIAGNOSIS — K5909 Other constipation: Secondary | ICD-10-CM

## 2016-07-18 DIAGNOSIS — G2581 Restless legs syndrome: Secondary | ICD-10-CM | POA: Diagnosis not present

## 2016-07-18 DIAGNOSIS — R569 Unspecified convulsions: Secondary | ICD-10-CM | POA: Diagnosis not present

## 2016-07-18 DIAGNOSIS — E785 Hyperlipidemia, unspecified: Secondary | ICD-10-CM | POA: Diagnosis not present

## 2016-07-18 DIAGNOSIS — F0280 Dementia in other diseases classified elsewhere without behavioral disturbance: Secondary | ICD-10-CM

## 2016-07-18 DIAGNOSIS — K219 Gastro-esophageal reflux disease without esophagitis: Secondary | ICD-10-CM | POA: Diagnosis not present

## 2016-07-18 DIAGNOSIS — G308 Other Alzheimer's disease: Secondary | ICD-10-CM

## 2016-07-18 DIAGNOSIS — I10 Essential (primary) hypertension: Secondary | ICD-10-CM

## 2016-07-18 DIAGNOSIS — E441 Mild protein-calorie malnutrition: Secondary | ICD-10-CM

## 2016-07-18 DIAGNOSIS — M24549 Contracture, unspecified hand: Secondary | ICD-10-CM

## 2016-07-18 NOTE — Progress Notes (Signed)
Patient ID: [name], female   DOB: 06-22-1931, 82 y.o.   MRN: [id number]    DATE:   07/18/16  MRN:  [id number]  BIRTHDAY: 1930/12/27  Facility:  Nursing Home Location:  [facility]  Nursing Home Room Number: 302-B  LEVEL OF CARE:  SNF [phone])  Contact Information    Name Relation Home Work Wallis Spouse [phone]  [phone]   [name] Daughter [phone]       Code Status History    Date Active Date Inactive Code Status Order ID Comments User Context   08/03/2013  2:59 PM 08/06/2013  4:52 PM DNR [id number]  Ashley Sheets, MD ED   08/03/2013  2:09 PM 08/03/2013  2:59 PM DNR [id number]  Ashley Sheets, MD ED       Chief Complaint  Patient presents with  . Medical Management of Chronic Issues    HISTORY OF PRESENT ILLNESS:  This is an 82 year old female who is being seen for a routine visit. She is comfort care/hospice. Speech Therapy was recently discontinued. Ativan was recently discontinued due to the use. She is currently having occupational therapy for contracture of bilateral hands.   PAST MEDICAL HISTORY:  Past Medical History:  Diagnosis Date  . Alzheimer disease    Without behavioral disturbance  . Anxiety   . Asthma   . Borderline diabetes   . CKD (chronic kidney disease)   . Conjunctivitis    Right  . Constipation, chronic   . Decubitus ulcer   . Dementia   . GERD (gastroesophageal reflux disease)   . HLD (hyperlipidemia)   . Hypercholesteremia   . Hypertension   . Hyponatremia   . Osteoarthrosis   . Pneumonia 10/2012  . RLS (restless legs syndrome)   . Seizures (HCC)    "one 5 years ago; none since as far as I know" (08/03/2013)  . Stroke [facility])    "said she had a mini stroke" (08/03/2013)  . Vitamin D deficiency      CURRENT MEDICATIONS: Reviewed  Patient's Medications  New Prescriptions   No medications on file  Previous Medications   ACETAMINOPHEN (TYLENOL)  325 MG TABLET    Take 650 mg by mouth 2 (two) times daily.    AMLODIPINE (NORVASC) 5 MG TABLET    Take 5 mg by mouth daily.    ASPIRIN 81 MG CHEWABLE TABLET    Chew 81 mg by mouth every other day.   CHOLECALCIFEROL (VITAMIN D-3 PO)    Take 1,000 Units by mouth daily.   DIVALPROEX (DEPAKOTE SPRINKLE) 125 MG CAPSULE    Take two tablets = 250mg  by mouth twice daily for seizures. DO NOT CRUSH OR CHEW   DOCUSATE (COLACE) 50 MG/5ML LIQUID    Take 200 mg by mouth 2 (two) times daily.    DONEPEZIL (ARICEPT) 5 MG TABLET    Take 5 mg by mouth at bedtime.    FOLIC ACID (FOLVITE) 1 MG TABLET    Take 1 mg by mouth every other day.    LORAZEPAM (ATIVAN) 0.5 MG TABLET    Take 1/2 tablet by mouth twice daily for anxiety   LOSARTAN (COZAAR) 100 MG TABLET    Take 100 mg by mouth daily.    MEMANTINE (NAMENDA) 10 MG TABLET    Take 10 mg by mouth 2 (two) times daily.    METOPROLOL TARTRATE (LOPRESSOR) 25 MG TABLET    Take 25 mg by mouth 2 (two) times  daily.   MULTIPLE VITAMINS-MINERALS (DECUBI-VITE) CAPS    Take 1 capsule by mouth daily.   PANTOPRAZOLE (PROTONIX) 20 MG TABLET    Take 20 mg by mouth daily.   PROTEIN (PROCEL PO)    Take 1 scoop by mouth 2 (two) times daily.    ROPINIROLE (REQUIP) 0.25 MG TABLET    Take 0.25 mg by mouth at bedtime.    ROSUVASTATIN (CRESTOR) 5 MG TABLET    Take 5 mg by mouth daily.    TIZANIDINE (ZANAFLEX) 2 MG CAPSULE    Take 2 mg by mouth daily.   UNABLE TO FIND    Med Name: MedPass 90 mL QID  Modified Medications   No medications on file  Discontinued Medications   No medications on file     Allergies  Allergen Reactions  . Penicillins Other (See Comments)    Per mar     REVIEW OF SYSTEMS:  Unable to obtain due to advanced Dementia   PHYSICAL EXAMINATION  GENERAL APPEARANCE: Well nourished. In no acute distress. Normal body habitus HEAD: Normal in size and contour. No evidence of trauma EARS: Pinnae are normal. Patient hears normal voice tunes of the  examiner MOUTH and THROAT: Lips are without lesions. Oral mucosa is moist and without lesions. Tongue is normal in shape, size, and color and without lesions NECK: supple, trachea midline, no neck masses, no thyroid tenderness, no thyromegaly LYMPHATICS: no LAN in the neck, no supraclavicular LAN RESPIRATORY: breathing is even & unlabored, BS CTAB CARDIAC: RRR, no murmur,no extra heart sounds, no edema GI: abdomen soft, normal BS, no masses, no tenderness, no hepatomegaly, no splenomegaly EXTREMITIES:  Bilateral hands are contracted; limited ROM on bilateral shoulders; generalized weakness on BLE PSYCHIATRIC:  Disoriented X3. Affect and behavior are appropriate  LABS/RADIOLOGY: Labs reviewed: 01/23/16  Valproic Acid 51.9 Lab Results  Component Value Date   CHOL 138 10/24/2015   HDL 51 10/24/2015   LDLCALC 74 10/24/2015   TRIG 63 10/24/2015    Basic Metabolic Panel:  Recent Labs  16/05/9611/28/16 09/22/15 10/05/15 01/23/16  NA 136* 142  --  137  K  --   --  4.1 4.1  BUN 31* 18  --  22*  CREATININE 1.5* 0.8  --  1.2*   Liver Function Tests:  Recent Labs  09/22/15 01/23/16  AST  --  13  ALT 9 11  ALKPHOS 57 65   CBC:  Recent Labs  08/09/15 09/22/15 01/23/16  WBC 8.7 7.4 7.7  NEUTROABS  --   --  3  HGB 11.2* 12.5 13.0  HCT 35* 38 41  PLT 217 68* 204     Lipid Panel     Component Value Date/Time   CHOL 138 10/24/2015   TRIG 63 10/24/2015   HDL 51 10/24/2015   LDLCALC 74 10/24/2015      ASSESSMENT/PLAN:  Bilateral hands contracture - continue OT treatment for orthotic management and therapeutic exercises; continue Tylenol 650 mg BID for pain and Zanaflex 2 mg 1 capsule  PO Q D for muscle spasm  Protein-calorie malnutrition - continue Procel 1 scoop PO BID and Medpass 90 ml QID  Hypertension - well-controlled; continue Norvasc 5 mg by mouth daily, Cozaar 100 mg by mouth daily and Lopressor 25 mg by mouth twice a day   Restless leg syndrome - stable; continue  Requip 0.25 mg by mouth daily at bedtime   Hyperlipidemia - continue Crestor 5 mg by mouth daily  Seizure - No seizure  activity; continue Depakote 250 mg by mouth twice a day 01/23/16  depakote level 51.9  Constipation - continue Colace 200 mg/20 mL by mouth twice a day  GERD - continue Protonix 40 mg by mouth daily  Anxiety - mood is stable; continue Ativan 0.5 mg 1/2 tab = 0.25 mg by mouth twice a day and Ativan PRN was discontinued  Alzheimer's disease - advanced; continue Namenda 10 mg by mouth twice a day and Aricept 5 mg by mouth daily       Goals of care:  Long-term care/Hospice care   Kenard GowerMonina Medina-Vargas, NP [facility] [phone]

## 2016-08-16 ENCOUNTER — Non-Acute Institutional Stay (SKILLED_NURSING_FACILITY): Payer: Medicare HMO | Admitting: Adult Health

## 2016-08-16 ENCOUNTER — Encounter: Payer: Self-pay | Admitting: Adult Health

## 2016-08-16 DIAGNOSIS — I10 Essential (primary) hypertension: Secondary | ICD-10-CM

## 2016-08-16 DIAGNOSIS — E785 Hyperlipidemia, unspecified: Secondary | ICD-10-CM | POA: Diagnosis not present

## 2016-08-16 DIAGNOSIS — G2581 Restless legs syndrome: Secondary | ICD-10-CM | POA: Diagnosis not present

## 2016-08-16 DIAGNOSIS — F0280 Dementia in other diseases classified elsewhere without behavioral disturbance: Secondary | ICD-10-CM

## 2016-08-16 DIAGNOSIS — K5909 Other constipation: Secondary | ICD-10-CM | POA: Diagnosis not present

## 2016-08-16 DIAGNOSIS — G308 Other Alzheimer's disease: Secondary | ICD-10-CM

## 2016-08-16 NOTE — Progress Notes (Signed)
Patient ID: [name], female   DOB: [birth year], 83 y.o.   MRN: [id number]    DATE:   08/16/16  MRN:  [id number]  BIRTHDAY: [birth year]  Facility:  Nursing Home Location:  [facility]  Nursing Home Room Number: 302-B  LEVEL OF CARE:  SNF [phone](31)  Contact Information    Name Relation Home Work [place]   Leighty,Ezra Spouse [phone]  [phone]   Josephine IgoJarrell,Paula Daughter [phone]       Code Status History    Date Active Date Inactive Code Status Order ID Comments User Context   08/03/2013  2:59 PM 08/06/2013  4:52 PM DNR [id number]  Esperanza SheetsUlugbek N Buriev, MD ED   08/03/2013  2:09 PM 08/03/2013  2:59 PM DNR [id number]  Esperanza SheetsUlugbek N Buriev, MD ED       Chief Complaint  Patient presents with  . Medical Management of Chronic Issues    HISTORY OF PRESENT ILLNESS:  This is an 83 year old female who is being seen for a routine visit. She is no longer comfort care/hospice. She had a BP of 97/56 . No SOB.      PAST MEDICAL HISTORY:  Past Medical History:  Diagnosis Date  . Alzheimer disease    Without behavioral disturbance  . Anxiety   . Asthma   . Borderline diabetes   . CKD (chronic kidney disease)   . Conjunctivitis    Right  . Constipation, chronic   . Decubitus ulcer   . Dementia   . GERD (gastroesophageal reflux disease)   . HLD (hyperlipidemia)   . Hypercholesteremia   . Hypertension   . Hyponatremia   . Osteoarthrosis   . Pneumonia 10/2012  . RLS (restless legs syndrome)   . Seizures (HCC)    "one 5 years ago; none since as far as I know" (08/03/2013)  . Stroke [facility](HCC)    "said she had a mini stroke" (08/03/2013)  . Vitamin D deficiency      CURRENT MEDICATIONS: Reviewed  Patient's Medications  New Prescriptions   No medications on file  Previous Medications   ACETAMINOPHEN (TYLENOL) 325 MG TABLET    Take 650 mg by mouth 2 (two) times daily.    AMLODIPINE (NORVASC) 5 MG TABLET    Take 5 mg by mouth daily.      ASPIRIN 81 MG CHEWABLE TABLET    Chew 81 mg by mouth every other day.   CHOLECALCIFEROL (VITAMIN D-3 PO)    Take 1,000 Units by mouth daily.   DIVALPROEX (DEPAKOTE SPRINKLE) 125 MG CAPSULE    Take two tablets = 250mg  by mouth twice daily for seizures. DO NOT CRUSH OR CHEW   DOCUSATE (COLACE) 50 MG/5ML LIQUID    Take 200 mg by mouth 2 (two) times daily.    DONEPEZIL (ARICEPT) 5 MG TABLET    Take 5 mg by mouth at bedtime.    FOLIC ACID (FOLVITE) 1 MG TABLET    Take 1 mg by mouth every other day.    LORAZEPAM (ATIVAN) 0.5 MG TABLET    Take 1/2 tablet by mouth twice daily for anxiety   LOSARTAN (COZAAR) 100 MG TABLET    Take 100 mg by mouth daily.    MEMANTINE (NAMENDA) 10 MG TABLET    Take 10 mg by mouth 2 (two) times daily.    METOPROLOL TARTRATE (LOPRESSOR) 25 MG TABLET    Take 25 mg by mouth 2 (two) times daily.   MULTIPLE VITAMINS-MINERALS (DECUBI-VITE) CAPS  Take 1 capsule by mouth daily.   PANTOPRAZOLE (PROTONIX) 20 MG TABLET    Take 20 mg by mouth daily.   PROTEIN (PROCEL PO)    Take 1 scoop by mouth 2 (two) times daily.    ROPINIROLE (REQUIP) 0.25 MG TABLET    Take 0.25 mg by mouth at bedtime.    ROSUVASTATIN (CRESTOR) 5 MG TABLET    Take 5 mg by mouth daily.    TIZANIDINE (ZANAFLEX) 2 MG CAPSULE    Take 2 mg by mouth daily.   UNABLE TO FIND    Med Name: MedPass 90 mL QID  Modified Medications   No medications on file  Discontinued Medications   No medications on file     Allergies  Allergen Reactions  . Penicillins Other (See Comments)    Per mar     REVIEW OF SYSTEMS:  Unable to obtain due to advanced Dementia   PHYSICAL EXAMINATION  GENERAL APPEARANCE: Well nourished. In no acute distress. Normal body habitus HEAD: Normal in size and contour. No evidence of trauma EARS: Pinnae are normal. Patient hears normal voice tunes of the examiner MOUTH and THROAT: Lips are without lesions. Oral mucosa is moist and without lesions. Tongue is normal in shape, size, and color  and without lesions NECK: supple, trachea midline, no neck masses, no thyroid tenderness, no thyromegaly LYMPHATICS: no LAN in the neck, no supraclavicular LAN RESPIRATORY: breathing is even & unlabored, BS CTAB CARDIAC: RRR, no murmur,no extra heart sounds, no edema GI: abdomen soft, normal BS, no masses, no tenderness, no hepatomegaly, no splenomegaly EXTREMITIES:  Bilateral hands are contracted; limited ROM on bilateral shoulders; generalized weakness on BLE PSYCHIATRIC:  Disoriented X3. Affect and behavior are appropriate  LABS/RADIOLOGY: Labs reviewed: 01/23/16  Valproic Acid 51.9 Lab Results  Component Value Date   CHOL 138 10/24/2015   HDL 51 10/24/2015   LDLCALC 74 10/24/2015   TRIG 63 10/24/2015    Basic Metabolic Panel:  Recent Labs  16/10/96 10/05/15 01/23/16  NA 142  --  137  K  --  4.1 4.1  BUN 18  --  22*  CREATININE 0.8  --  1.2*   Liver Function Tests:  Recent Labs  09/22/15 01/23/16  AST  --  13  ALT 9 11  ALKPHOS 57 65   CBC:  Recent Labs  09/22/15 01/23/16  WBC 7.4 7.7  NEUTROABS  --  3  HGB 12.5 13.0  HCT 38 41  PLT 68* 204     Lipid Panel     Component Value Date/Time   CHOL 138 10/24/2015   TRIG 63 10/24/2015   HDL 51 10/24/2015   LDLCALC 74 10/24/2015      ASSESSMENT/PLAN:  Hypertension - BP 97/56, decrease Norvasc from 5 mg to 2.5 mg PO Q D, BP/HR BID X 1 week; continue Cozaar 100 mg PO Q D and Lopressor 25 mg PO BID  Restless leg syndrome - stable; continue Requip 0.25 mg by mouth daily at bedtime   Hyperlipidemia - continue Crestor 5 mg by mouth daily  Constipation - continue Colace 200 mg/20 mL by mouth twice a day  Alzheimer's disease - advanced; continue Namenda 10 mg by mouth twice a day and Aricept 5 mg by mouth daily       Goals of care:  Long-term care   Kenard Gower, NP [facility] [phone]

## 2016-08-20 LAB — CBC AND DIFFERENTIAL
HEMATOCRIT: 35 % — AB (ref 36–46)
Hemoglobin: 11.6 g/dL — AB (ref 12.0–16.0)
Platelets: 198 10*3/uL (ref 150–399)
WBC: 7.1 10^3/mL

## 2016-08-20 LAB — BASIC METABOLIC PANEL
BUN: 31 mg/dL — AB (ref 4–21)
CREATININE: 1 mg/dL (ref 0.5–1.1)
Glucose: 76 mg/dL
Potassium: 4.7 mmol/L (ref 3.4–5.3)
SODIUM: 142 mmol/L (ref 137–147)

## 2016-09-04 LAB — CBC AND DIFFERENTIAL
HCT: 32 % — AB (ref 36–46)
Hemoglobin: 10.7 g/dL — AB (ref 12.0–16.0)
Platelets: 136 10*3/uL — AB (ref 150–399)
WBC: 4.3 10*3/mL

## 2016-09-04 LAB — BASIC METABOLIC PANEL
BUN: 33 mg/dL — AB (ref 4–21)
Creatinine: 1 mg/dL (ref 0.5–1.1)
GLUCOSE: 86 mg/dL
POTASSIUM: 4.2 mmol/L (ref 3.4–5.3)
Sodium: 147 mmol/L (ref 137–147)

## 2016-09-17 ENCOUNTER — Encounter: Payer: Self-pay | Admitting: Adult Health

## 2016-09-17 ENCOUNTER — Non-Acute Institutional Stay (SKILLED_NURSING_FACILITY): Payer: Medicare HMO | Admitting: Adult Health

## 2016-09-17 DIAGNOSIS — E870 Hyperosmolality and hypernatremia: Secondary | ICD-10-CM

## 2016-09-17 DIAGNOSIS — G2581 Restless legs syndrome: Secondary | ICD-10-CM

## 2016-09-17 DIAGNOSIS — R918 Other nonspecific abnormal finding of lung field: Secondary | ICD-10-CM | POA: Diagnosis not present

## 2016-09-17 DIAGNOSIS — R569 Unspecified convulsions: Secondary | ICD-10-CM

## 2016-09-17 NOTE — Progress Notes (Signed)
DATE:  09/17/2016   MRN:  [id number]  BIRTHDAY: 12/28/1930  Facility:  Nursing Home Location:  [facility]  Nursing Home Room Number: 302-B  LEVEL OF CARE:  SNF [phone])  Contact Information    Name Relation Home Work Mobile   [place] Spouse [phone]  [phone]   [name] Daughter [phone]       Code Status History    Date Active Date Inactive Code Status Order ID Comments User Context   08/03/2013  2:59 PM 08/06/2013  4:52 PM DNR [id number]  Esperanza Sheets, MD ED   08/03/2013  2:09 PM 08/03/2013  2:59 PM DNR [id number]  Esperanza Sheets, MD ED    Advance Directive Documentation   Flowsheet Row Most Recent Value  Type of Advance Directive  Out of facility DNR (pink MOST or yellow form)  Pre-existing out of facility DNR order (yellow form or pink MOST form)  No data  "MOST" Form in Place?  No data       Chief Complaint  Patient presents with  . Medical Management of Chronic Issues    HISTORY OF PRESENT ILLNESS:  This is an 85-YO female seen for a routine visit.  She is a long-term care resident of [facility].  Lab reviewed - Na 147, elevated. She was noted to have right pleural-based density or mass. Pulmonology consult was ordered.  She is no longer being followed-up by hospice.    PAST MEDICAL HISTORY:  Past Medical History:  Diagnosis Date  . Alzheimer disease    Without behavioral disturbance  . Anxiety   . Asthma   . Borderline diabetes   . CKD (chronic kidney disease)   . Conjunctivitis    Right  . Constipation, chronic   . Decubitus ulcer   . Dementia   . GERD (gastroesophageal reflux disease)   . HLD (hyperlipidemia)   . Hypercholesteremia   . Hypertension   . Hyponatremia   . Osteoarthrosis   . Pneumonia 10/2012  . RLS (restless legs syndrome)   . Seizures (HCC)    "one 5 years ago; none since as far as I know" (08/03/2013)  . Stroke [facility])    "said she had a  mini stroke" (08/03/2013)  . Vitamin D deficiency      CURRENT MEDICATIONS: Reviewed  Patient's Medications  New Prescriptions   No medications on file  Previous Medications   ACETAMINOPHEN (TYLENOL) 325 MG TABLET    Take 650 mg by mouth 2 (two) times daily.    AMLODIPINE (NORVASC) 2.5 MG TABLET    Take 2.5 mg by mouth daily.   ASPIRIN 81 MG CHEWABLE TABLET    Chew 81 mg by mouth every other day.   CHOLECALCIFEROL (VITAMIN D-3 PO)    Take 1,000 Units by mouth daily.   DIVALPROEX (DEPAKOTE SPRINKLE) 125 MG CAPSULE    Take two tablets = 250mg  by mouth twice daily for seizures. DO NOT CRUSH OR CHEW   DOCUSATE (COLACE) 50 MG/5ML LIQUID    Take 200 mg by mouth 2 (two) times daily.    DONEPEZIL (ARICEPT) 5 MG TABLET    Take 5 mg by mouth at bedtime.    FOLIC ACID (FOLVITE) 1 MG TABLET    Take 1 mg by mouth every other day.    LORAZEPAM (ATIVAN) 0.5 MG TABLET    Take 1/2 tablet by mouth twice daily for anxiety   LOSARTAN (COZAAR) 100 MG TABLET  Take 100 mg by mouth daily.    MEMANTINE (NAMENDA) 10 MG TABLET    Take 10 mg by mouth 2 (two) times daily.    METOPROLOL TARTRATE (LOPRESSOR) 25 MG TABLET    Take 25 mg by mouth 2 (two) times daily.   MULTIPLE VITAMINS-MINERALS (DECUBI-VITE) CAPS    Take 1 capsule by mouth daily.   PANTOPRAZOLE (PROTONIX) 20 MG TABLET    Take 20 mg by mouth daily.   PROTEIN (PROCEL PO)    Take 1 scoop by mouth 2 (two) times daily.    ROPINIROLE (REQUIP) 0.25 MG TABLET    Take 0.25 mg by mouth at bedtime.    ROSUVASTATIN (CRESTOR) 5 MG TABLET    Take 5 mg by mouth daily.    TIZANIDINE (ZANAFLEX) 2 MG CAPSULE    Take 2 mg by mouth daily.   UNABLE TO FIND    Med Name: MedPass 90 mL QID  Modified Medications   No medications on file  Discontinued Medications   No medications on file     Allergies  Allergen Reactions  . Penicillins Other (See Comments)    Per mar     REVIEW OF SYSTEMS:  Unable to obtain due to advanced Alzheimer's disease     PHYSICAL  EXAMINATION  GENERAL APPEARANCE:  In no acute distress. Body mass index is 20.74 kg/m. SKIN:  Skin is warm and dry.  HEAD: Normal in size and contour. No evidence of trauma EYES: Lids open and close normally. No blepharitis, entropion or ectropion. PERRL. Conjunctivae are clear and sclerae are white. Lenses are without opacity EARS: Pinnae are normal.  MOUTH and THROAT: Lips are without lesions. Oral mucosa is moist and without lesions. Tongue is normal in shape, size, and color and without lesions NECK: supple, trachea midline, no neck masses, no thyroid tenderness, no thyromegaly LYMPHATICS: no LAN in the neck, no supraclavicular LAN RESPIRATORY: breathing is even & unlabored, BS CTAB CARDIAC: RRR, no murmur,no extra heart sounds, no edema GI: abdomen soft, normal BS, no masses, no tenderness, no hepatomegaly, no splenomegaly EXTREMITIES: Bilateral hands are contracted, has splints ; limited ROM on bilateral shoulders; BLE has generalized weakness PSYCHIATRIC: Disoriented X 3. Affect and behavior are appropriate    LABS/RADIOLOGY: Labs reviewed: Basic Metabolic Panel:  Recent Labs  82/95/6206/13/17 08/20/16 09/04/16  NA 137 142 147  K 4.1 4.7 4.2  BUN 22* 31* 33*  CREATININE 1.2* 1.0 1.0   Liver Function Tests:  Recent Labs  09/22/15 01/23/16  AST  --  13  ALT 9 11  ALKPHOS 57 65   CBC:  Recent Labs  01/23/16 08/20/16 09/04/16  WBC 7.7 7.1 4.3  NEUTROABS 3  --   --   HGB 13.0 11.6* 10.7*  HCT 41 35* 32*  PLT 204 198 136*   Lipid Panel:  Recent Labs  10/24/15  HDL 51    ASSESSMENT/PLAN:  Lung mass - CXR result showed lung pleural-based lung density or mass; follow-up with pulmonology; no SOB  Restless leg syndrome - continue Requip 0.25 mg 1 tab by mouth daily at bedtime  Seizure disorder - no seizures been noted; continue Depakote 125 mg 2 caps = 250 mg by mouth twice a day  Hypernatremia - Na 147; encourage oral fluid intake; re-check BMP     Goals of  care:  Long-term care    Monina C. Medina-Vargas - NP [facility] [phone]

## 2016-09-19 LAB — BASIC METABOLIC PANEL
BUN: 24 mg/dL — AB (ref 4–21)
CREATININE: 0.8 mg/dL (ref 0.5–1.1)
Glucose: 89 mg/dL
Sodium: 142 mmol/L (ref 137–147)

## 2016-10-16 ENCOUNTER — Non-Acute Institutional Stay (SKILLED_NURSING_FACILITY): Payer: Medicare HMO | Admitting: Adult Health

## 2016-10-16 ENCOUNTER — Encounter: Payer: Self-pay | Admitting: Adult Health

## 2016-10-16 DIAGNOSIS — G309 Alzheimer's disease, unspecified: Secondary | ICD-10-CM | POA: Diagnosis not present

## 2016-10-16 DIAGNOSIS — D696 Thrombocytopenia, unspecified: Secondary | ICD-10-CM

## 2016-10-16 DIAGNOSIS — E785 Hyperlipidemia, unspecified: Secondary | ICD-10-CM | POA: Diagnosis not present

## 2016-10-16 DIAGNOSIS — D638 Anemia in other chronic diseases classified elsewhere: Secondary | ICD-10-CM | POA: Diagnosis not present

## 2016-10-16 DIAGNOSIS — F0280 Dementia in other diseases classified elsewhere without behavioral disturbance: Secondary | ICD-10-CM | POA: Diagnosis not present

## 2016-10-16 DIAGNOSIS — I10 Essential (primary) hypertension: Secondary | ICD-10-CM | POA: Diagnosis not present

## 2016-10-16 NOTE — Progress Notes (Signed)
DATE:  10/16/2016   MRN:  [id number]  BIRTHDAY: September 09, 1930  Facility:  Nursing Home Location:  [facility]  Nursing Home Room Number: 302-B  LEVEL OF CARE:  SNF [phone])  Contact Information    Name Relation Home Work Mobile   [place] Spouse [phone]  [phone]   [name] Daughter [phone]       Code Status History    Date Active Date Inactive Code Status Order ID Comments User Context   08/03/2013  2:59 PM 08/06/2013  4:52 PM DNR [id number]  Esperanza Sheets, MD ED   08/03/2013  2:09 PM 08/03/2013  2:59 PM DNR [id number]  Esperanza Sheets, MD ED    Advance Directive Documentation   Flowsheet Row Most Recent Value  Type of Advance Directive  Out of facility DNR (pink MOST or yellow form)  Pre-existing out of facility DNR order (yellow form or pink MOST form)  No data  "MOST" Form in Place?  No data       Chief Complaint  Patient presents with  . Medical Management of Chronic Issues    HISTORY OF PRESENT ILLNESS:  This is an 85-YO female seen for a routine visit.  She is a long-term care resident of [facility]. Latest weight is 107 lbs, Body mass index is 20.74 kg/m. Recently ordered fortified foods to meals weekly X 4 weeks.      PAST MEDICAL HISTORY:  Past Medical History:  Diagnosis Date  . Alzheimer disease    Without behavioral disturbance  . Anxiety   . Asthma   . Borderline diabetes   . CKD (chronic kidney disease)   . Conjunctivitis    Right  . Constipation, chronic   . Decubitus ulcer   . Dementia   . GERD (gastroesophageal reflux disease)   . HLD (hyperlipidemia)   . Hypercholesteremia   . Hypertension   . Hyponatremia   . Osteoarthrosis   . Pneumonia 10/2012  . RLS (restless legs syndrome)   . Seizures (HCC)    "one 5 years ago; none since as far as I know" (08/03/2013)  . Stroke [facility])    "said she had a mini stroke" (08/03/2013)  . Vitamin D deficiency        CURRENT MEDICATIONS: Reviewed  Patient's Medications  New Prescriptions   No medications on file  Previous Medications   ACETAMINOPHEN (TYLENOL) 325 MG TABLET    Take 650 mg by mouth 2 (two) times daily.    AMLODIPINE (NORVASC) 2.5 MG TABLET    Take 2.5 mg by mouth daily.   ASPIRIN 81 MG CHEWABLE TABLET    Chew 81 mg by mouth every other day.   CHOLECALCIFEROL (VITAMIN D-3 PO)    Take 1,000 Units by mouth daily.   DIVALPROEX (DEPAKOTE SPRINKLE) 125 MG CAPSULE    Take two tablets = 250mg  by mouth twice daily for seizures. DO NOT CRUSH OR CHEW   DOCUSATE (COLACE) 50 MG/5ML LIQUID    Take 200 mg by mouth 2 (two) times daily.    DONEPEZIL (ARICEPT) 5 MG TABLET    Take 5 mg by mouth at bedtime.    FOLIC ACID (FOLVITE) 1 MG TABLET    Take 1 mg by mouth every other day.    LORAZEPAM (ATIVAN) 0.5 MG TABLET    Take 1/2 tablet by mouth twice daily for anxiety   LOSARTAN (COZAAR) 100 MG TABLET    Take 100 mg by mouth  daily.    MEMANTINE (NAMENDA) 10 MG TABLET    Take 10 mg by mouth 2 (two) times daily.    METOPROLOL TARTRATE (LOPRESSOR) 25 MG TABLET    Take 25 mg by mouth 2 (two) times daily.   MULTIPLE VITAMINS-MINERALS (DECUBI-VITE) CAPS    Take 1 capsule by mouth daily.   PANTOPRAZOLE (PROTONIX) 20 MG TABLET    Take 20 mg by mouth daily.   PROTEIN (PROCEL PO)    Take 1 scoop by mouth 2 (two) times daily.    ROPINIROLE (REQUIP) 0.25 MG TABLET    Take 0.25 mg by mouth at bedtime.    ROSUVASTATIN (CRESTOR) 5 MG TABLET    Take 5 mg by mouth daily.    TIZANIDINE (ZANAFLEX) 2 MG CAPSULE    Take 2 mg by mouth daily.   UNABLE TO FIND    Med Name: MedPass 90 mL QID  Modified Medications   No medications on file  Discontinued Medications   No medications on file     Allergies  Allergen Reactions  . Penicillins Other (See Comments)    Per mar     REVIEW OF SYSTEMS:  Unable to obtain due to advanced Alzheimer's disease     PHYSICAL EXAMINATION  GENERAL APPEARANCE:  In no acute  distress. Body mass index is 20.74 kg/m. SKIN:  Skin is warm and dry.  HEAD: Normal in size and contour. No evidence of trauma EYES: Lids open and close normally. No blepharitis, entropion or ectropion. PERRL. Conjunctivae are clear and sclerae are white. Lenses are without opacity EARS: Pinnae are normal.  MOUTH and THROAT: Lips are without lesions. Oral mucosa is moist and without lesions. Tongue is normal in shape, size, and color and without lesions NECK: supple, trachea midline, no neck masses, no thyroid tenderness, no thyromegaly LYMPHATICS: no LAN in the neck, no supraclavicular LAN RESPIRATORY: breathing is even & unlabored, BS CTAB CARDIAC: RRR, no murmur,no extra heart sounds, no edema GI: abdomen soft, normal BS, no masses, no tenderness, no hepatomegaly, no splenomegaly EXTREMITIES: Bilateral hands are contracted, has splints ; limited ROM on bilateral shoulders; BLE has generalized weakness PSYCHIATRIC: Disoriented X 3. Affect and behavior are appropriate    LABS/RADIOLOGY: Labs reviewed: Basic Metabolic Panel:  Recent Labs  86/57/84 08/20/16 09/04/16 09/19/16  NA 137 142 147 142  K 4.1 4.7 4.2  --   BUN 22* 31* 33* 24*  CREATININE 1.2* 1.0 1.0 0.8   Liver Function Tests:  Recent Labs  01/23/16  AST 13  ALT 11  ALKPHOS 65   CBC:  Recent Labs  01/23/16 08/20/16 09/04/16  WBC 7.7 7.1 4.3  NEUTROABS 3  --   --   HGB 13.0 11.6* 10.7*  HCT 41 35* 32*  PLT 204 198 136*   Lipid Panel:  Recent Labs  10/24/15  HDL 51    ASSESSMENT/PLAN:  Hypertension - well-controlled; Continue Cozaar, metoprolol and Norvasc  Dementia - continue Aricept and Namenda; continue supportive care; fall precautions  Hyperlipidemia - continue Crestor Lab Results  Component Value Date   CHOL 138 10/24/2015   HDL 51 10/24/2015   LDLCALC 74 10/24/2015   TRIG 63 10/24/2015   Anemia of chronic disease -stable will Lab Results  Component Value Date   HGB 10.7 (A)  09/04/2016   Thrombocytopenia - no bleeding nor bruising Lab Results  Component Value Date   PLT 136 (A) 09/04/2016      Goals of care:  Long-term care  Yaron Grasse C. Medina-Vargas - NP [facility] [phone]

## 2016-11-19 ENCOUNTER — Encounter: Payer: Self-pay | Admitting: Internal Medicine

## 2016-11-19 ENCOUNTER — Non-Acute Institutional Stay (SKILLED_NURSING_FACILITY): Payer: Medicare HMO | Admitting: Internal Medicine

## 2016-11-19 DIAGNOSIS — G309 Alzheimer's disease, unspecified: Principal | ICD-10-CM

## 2016-11-19 DIAGNOSIS — E785 Hyperlipidemia, unspecified: Secondary | ICD-10-CM | POA: Diagnosis not present

## 2016-11-19 DIAGNOSIS — G2581 Restless legs syndrome: Secondary | ICD-10-CM | POA: Diagnosis not present

## 2016-11-19 DIAGNOSIS — K2970 Gastritis, unspecified, without bleeding: Secondary | ICD-10-CM | POA: Insufficient documentation

## 2016-11-19 DIAGNOSIS — K2950 Unspecified chronic gastritis without bleeding: Secondary | ICD-10-CM

## 2016-11-19 DIAGNOSIS — F0281 Dementia in other diseases classified elsewhere with behavioral disturbance: Secondary | ICD-10-CM | POA: Diagnosis not present

## 2016-11-19 DIAGNOSIS — G308 Other Alzheimer's disease: Secondary | ICD-10-CM

## 2016-11-19 DIAGNOSIS — D638 Anemia in other chronic diseases classified elsewhere: Secondary | ICD-10-CM | POA: Diagnosis not present

## 2016-11-19 DIAGNOSIS — E43 Unspecified severe protein-calorie malnutrition: Secondary | ICD-10-CM

## 2016-11-19 DIAGNOSIS — E46 Unspecified protein-calorie malnutrition: Secondary | ICD-10-CM | POA: Insufficient documentation

## 2016-11-19 NOTE — Progress Notes (Signed)
Patient ID: [name], female   DOB: January 12, 1931, 83 y.o.   MRN: [id number]     [facility]   PCP: Oneal Grout, MD  Code Status: DNR  Allergies  Allergen Reactions  . Penicillins Other (See Comments)    Per mar    Chief Complaint  Patient presents with  . Medical Management of Chronic Issues    Routine Visit      HPI:  83 year old female patient is seen for routine visit. She Is under total care and does not participate in history taking and review of system. Per nursing no new concerns. She takes her pills crushed. No fall has been reported. No pressure ulcer or acute behavior change has been reported. She occasionally gets out of bed. She receives nutritional supplements.  Review of Systems:  Unable to obtain with her dementia. She requires assistance with feeding.     Past Medical History:  Diagnosis Date  . Alzheimer disease    Without behavioral disturbance  . Anxiety   . Asthma   . Borderline diabetes   . CKD (chronic kidney disease)   . Conjunctivitis    Right  . Constipation, chronic   . Decubitus ulcer   . Dementia   . GERD (gastroesophageal reflux disease)   . HLD (hyperlipidemia)   . Hypercholesteremia   . Hypertension   . Hyponatremia   . Osteoarthrosis   . Pneumonia 10/2012  . RLS (restless legs syndrome)   . Seizures (HCC)    "one 5 years ago; none since as far as I know" (08/03/2013)  . Stroke [facility])    "said she had a mini stroke" (08/03/2013)  . Vitamin D deficiency     Medications: Patient's Medications  New Prescriptions   No medications on file  Previous Medications   ACETAMINOPHEN (TYLENOL) 325 MG TABLET    Take 650 mg by mouth 2 (two) times daily.    AMLODIPINE (NORVASC) 2.5 MG TABLET    Take 2.5 mg by mouth daily.   ASPIRIN 81 MG CHEWABLE TABLET    Chew 81 mg by mouth every other day.   CHOLECALCIFEROL (VITAMIN D-3 PO)    Take 1,000 Units by mouth daily.   DIVALPROEX (DEPAKOTE SPRINKLE)  125 MG CAPSULE    Take two tablets =  by mouth twice daily for seizures. DO NOT CRUSH OR CHEW   DOCUSATE (COLACE) 50 MG/5ML LIQUID    Take 200 mg by mouth 2 (two) times daily.    DONEPEZIL (ARICEPT) 5 MG TABLET    Take 5 mg by mouth at bedtime.    FOLIC ACID (FOLVITE) 1 MG TABLET    Take 1 mg by mouth every other day.    LORAZEPAM (ATIVAN) 0.5 MG TABLET    Take 1/2 tablet by mouth twice daily for anxiety   LOSARTAN (COZAAR) 100 MG TABLET    Take 100 mg by mouth daily.    MEMANTINE (NAMENDA) 10 MG TABLET    Take 10 mg by mouth 2 (two) times daily.    METOPROLOL TARTRATE (LOPRESSOR) 25 MG TABLET    Take 25 mg by mouth 2 (two) times daily.   MULTIPLE VITAMINS-MINERALS (DECUBI-VITE) CAPS    Take 1 capsule by mouth daily.   PANTOPRAZOLE (PROTONIX) 20 MG TABLET    Take 20 mg by mouth daily.   PROTEIN (PROCEL PO)    Take 1 scoop by mouth 2 (two) times daily.    ROPINIROLE (REQUIP) 0.25 MG TABLET  Take 0.25 mg by mouth at bedtime.    ROSUVASTATIN (CRESTOR) 5 MG TABLET    Take 5 mg by mouth daily.    TIZANIDINE (ZANAFLEX) 2 MG CAPSULE    Take 2 mg by mouth daily.   UNABLE TO FIND    Med Name: MedPass 90 mL QID  Modified Medications   No medications on file  Discontinued Medications   No medications on file     Physical Exam: Vitals:   11/19/16 1235  BP: 124/68  Pulse: 82  Resp: 18  Temp: 97.2 F (36.2 C)  TempSrc: Oral  SpO2: 96%  Weight: 109 lb 6.4 oz (49.6 kg)  Height:  (1.575 m)   Wt Readings from Last 3 Encounters:  11/19/16 109 lb 6.4 oz (49.6 kg)  10/16/16 113 lb 6.4 oz (51.4 kg)  09/17/16 113 lb 6.4 oz (51.4 kg)   Body mass index is 20.01 kg/m.   Constitutional: Elderly female patient, thin built and frail, in no acute distress Head: Normocephalic and atraumatic.   Mouth: Moist mucous membrane Eyes: Pupils are equal, round, and reactive to light.  Neck: No cervical lymphadenopathy  Cardiovascular: Normal s1, s2, no murmur Pulmonary/Chest: CTAB, no wheeze,  crackles or rhonchi Abdominal: Bowel sound present, soft, nondistended  Musculoskeletal: Has contractures to both her hands with soft brace in place, no leg edema, generalized weakness to both upper and lower extremities, soft brace to her hands, under total care Neurological: alert but not oriented Skin: warm and dry Psychiatric: normal mood and affect.    Labs reviewed: CBC Latest Ref Rng & Units 09/04/2016 08/20/2016 01/23/2016  WBC 10:3/mL 4.3 7.1 7.7  Hemoglobin 12.0 - 16.0 g/dL 10.7(A) 11.6(A) 13.0  Hematocrit 36 - 46 % 32(A) 35(A) 41  Platelets 150 - 399 K/L 136(A) 198 204   CMP Latest Ref Rng & Units 09/19/2016 09/04/2016 08/20/2016  Glucose 65 - 99 mg/dL - - -  BUN 4 - 21 mg/dL 16(X) 09(U) 04(V)  Creatinine 0.5 - 1.1 mg/dL 0.8 1.0 1.0  Sodium 409 - 147 mmol/L 142 147 142  Potassium 3.4 - 5.3 mmol/L - 4.2 4.7  Chloride 101 - 111 mmol/L - - -  CO2 22 - 32 mmol/L - - -  Calcium 8.9 - 10.3 mg/dL - - -  Total Protein 6.5 - 8.1 g/dL - - -  Total Bilirubin 0.3 - 1.2 mg/dL - - -  Alkaline Phos 25 - 125 U/L - - -  AST 13 - 35 U/L - - -  ALT 7 - 35 U/L - - -   Lipid Panel     Component Value Date/Time   CHOL 138 10/24/2015   TRIG 63 10/24/2015   HDL 51 10/24/2015   LDLCALC 74 10/24/2015   01/23/16 valproic acid 51.9  Foot Exam Completed: 09/08/15 Eye Exam: Patient refused/combative   Assessment/Plan  Alzheimer's dementia with behavior disturbance Currently on Namenda 1 mg twice a day, donepezil 5 mg daily, under total care. Provide supportive care. On ativan 0.25 mg bid for anxiety. Get psychiatry consult to assess further. D/c donepezil given her advanced dementia and ongoing weight loss.   Protein calorie malnutrition severe and decline is anticipated given her advanced dementia. Discontinue donepezil given her ongoing weight loss. Continue nutritional supplements and to provide assistance with feeding. Monitor weight.  Wt Readings from Last 3 Encounters:  11/19/16 109 lb  6.4 oz (49.6 kg)  10/16/16 113 lb 6.4 oz (51.4 kg)  09/17/16 113 lb 6.4 oz (  51.4 kg)   Anemia of chronic disease Currently on folic acid supplement. Monitor CBC periodically  Restless leg syndrome Continue ropinirole 0.25 mg daily and monitor   Chronic gastritis Noted on EGD.Currently on Protonix 20 mg daily. Continue this.   Hyperlipidemia Currently on rosuvastatin 5 mg daily. No recent lipid panel for review. Check lipid panel.    Oneal Grout, MD Internal Medicine [facility] [address] [place], [place] [address] Cell Phone (Monday-Friday 8 am - 5 pm): [phone] On Call: [phone] and follow prompts after 5 pm and on weekends Office Phone: [phone] Office Fax: [phone]

## 2016-11-25 LAB — LIPID PANEL
CHOLESTEROL: 143 mg/dL (ref 0–200)
HDL: 56 mg/dL (ref 35–70)
LDL CALC: 67 mg/dL
Triglycerides: 99 mg/dL (ref 40–160)

## 2016-12-12 ENCOUNTER — Non-Acute Institutional Stay (SKILLED_NURSING_FACILITY): Payer: Medicare HMO | Admitting: Adult Health

## 2016-12-12 DIAGNOSIS — I10 Essential (primary) hypertension: Secondary | ICD-10-CM

## 2016-12-12 DIAGNOSIS — K219 Gastro-esophageal reflux disease without esophagitis: Secondary | ICD-10-CM | POA: Diagnosis not present

## 2016-12-12 DIAGNOSIS — K5909 Other constipation: Secondary | ICD-10-CM

## 2016-12-12 DIAGNOSIS — F419 Anxiety disorder, unspecified: Secondary | ICD-10-CM

## 2016-12-12 DIAGNOSIS — R569 Unspecified convulsions: Secondary | ICD-10-CM | POA: Diagnosis not present

## 2016-12-13 ENCOUNTER — Encounter: Payer: Self-pay | Admitting: Adult Health

## 2016-12-19 NOTE — Progress Notes (Signed)
DATE:  12/12/2016  MRN:  [id number]  BIRTHDAY: May 30, 1931  Facility:  Nursing Home Location:  [facility]  Nursing Home Room Number: 302-B  LEVEL OF CARE:  SNF [phone])  Contact Information    Name Relation Home Work [employer]   Acord,Ezra Spouse [phone]  [phone]   Josephine IgoJarrell,Paula Daughter [phone]       Code Status History    Date Active Date Inactive Code Status Order ID Comments User Context   08/03/2013  2:59 PM 08/06/2013  4:52 PM DNR [id number]  Esperanza SheetsBuriev, Ulugbek N, MD ED   08/03/2013  2:09 PM 08/03/2013  2:59 PM DNR [id number]  Esperanza SheetsBuriev, Ulugbek N, MD ED    Advance Directive Documentation     Most Recent Value  Type of Advance Directive  Out of facility DNR (pink MOST or yellow form)  Pre-existing out of facility DNR order (yellow form or pink MOST form)  -  "MOST" Form in Place?  -       Chief Complaint  Patient presents with  . Medical Management of Chronic Issues    HISTORY OF PRESENT ILLNESS:  This is an 85-YO female seen for a routine visit.  She is a [facility]. She has been seen in her room today and noted to be pleasantly confused. She is no longer a comfort/hospice patient since she has been stable for a while.   PAST MEDICAL HISTORY:  Past Medical History:  Diagnosis Date  . Alzheimer disease    Without behavioral disturbance  . Anxiety   . Asthma   . Borderline diabetes   . CKD (chronic kidney disease)   . Conjunctivitis    Right  . Constipation, chronic   . Decubitus ulcer   . Dementia   . GERD (gastroesophageal reflux disease)   . HLD (hyperlipidemia)   . Hypercholesteremia   . Hypertension   . Hyponatremia   . Osteoarthrosis   . Pneumonia 10/2012  . RLS (restless legs syndrome)   . Seizures (HCC)    "one 5 years ago; none since as far as I know" (08/03/2013)  . Stroke [facility](HCC)    "said she had a mini stroke" (08/03/2013)  . Vitamin D  deficiency      CURRENT MEDICATIONS: Reviewed  Patient's Medications  New Prescriptions   No medications on file  Previous Medications   ACETAMINOPHEN (TYLENOL) 325 MG TABLET    Take 650 mg by mouth 2 (two) times daily.    AMLODIPINE (NORVASC) 2.5 MG TABLET    Take 2.5 mg by mouth daily.   ASPIRIN 81 MG CHEWABLE TABLET    Chew 81 mg by mouth every other day.   CHOLECALCIFEROL (VITAMIN D-3 PO)    Take 1,000 Units by mouth daily.   DIVALPROEX (DEPAKOTE SPRINKLE) 125 MG CAPSULE    Take two tablets = 250mg  by mouth twice daily for seizures. DO NOT CRUSH OR CHEW   DOCUSATE (COLACE) 50 MG/5ML LIQUID    Take 200 mg by mouth 2 (two) times daily.    FOLIC ACID (FOLVITE) 1 MG TABLET    Take 1 mg by mouth every other day.    LORAZEPAM (ATIVAN) 0.5 MG TABLET    Take 1/2 tablet by mouth twice daily for anxiety   LOSARTAN (COZAAR) 100 MG TABLET    Take 100 mg by mouth daily.    MEMANTINE (NAMENDA) 10 MG TABLET    Take 10 mg by mouth  2 (two) times daily.    METOPROLOL TARTRATE (LOPRESSOR) 25 MG TABLET    Take 25 mg by mouth 2 (two) times daily.   MULTIPLE VITAMINS-MINERALS (DECUBI-VITE) CAPS    Take 1 capsule by mouth daily.   PANTOPRAZOLE (PROTONIX) 20 MG TABLET    Take 20 mg by mouth daily.   PROTEIN (PROCEL PO)    Take 1 scoop by mouth 2 (two) times daily.    ROPINIROLE (REQUIP) 0.25 MG TABLET    Take 0.25 mg by mouth at bedtime.    ROSUVASTATIN (CRESTOR) 5 MG TABLET    Take 5 mg by mouth daily.    TIZANIDINE (ZANAFLEX) 2 MG CAPSULE    Take 2 mg by mouth daily.   UNABLE TO FIND    Med Name: MedPass 90 mL QID  Modified Medications   No medications on file  Discontinued Medications   DONEPEZIL (ARICEPT) 5 MG TABLET    Take 5 mg by mouth at bedtime.      Allergies  Allergen Reactions  . Penicillins Other (See Comments)    Per mar     REVIEW OF SYSTEMS:  Unable to obtain due to advanced dementia    PHYSICAL EXAMINATION  GENERAL APPEARANCE: Well nourished. In no acute distress. Normal  body habitus SKIN:  Skin is warm and dry.  HEAD: Normal in size and contour. No evidence of trauma EYES: Lids open and close normally. No blepharitis, entropion or ectropion. PERRL. Conjunctivae are clear and sclerae are white. Lenses are without opacity EARS: Pinnae are normal. Patient hears normal voice tunes of the examiner MOUTH and THROAT: Lips are without lesions. Oral mucosa is moist and without lesions. Tongue is normal in shape, size, and color and without lesions NECK: supple, trachea midline, no neck masses, no thyroid tenderness, no thyromegaly LYMPHATICS: no LAN in the neck, no supraclavicular LAN RESPIRATORY: breathing is even & unlabored, BS CTAB CARDIAC: RRR, no murmur,no extra heart sounds, no edema GI: abdomen soft, normal BS, no masses, no tenderness, no hepatomegaly, no splenomegaly EXTREMITIES:  Able to move X 4 extremities, has generalized weakness, bilateral hands are contracted PSYCHIATRIC: Alert to self, disoriented to time and place. Affect and behavior are appropriate   LABS/RADIOLOGY: Labs reviewed: Basic Metabolic Panel:  Recent Labs  16/10/96 08/20/16 09/04/16 09/19/16  NA 137 142 147 142  K 4.1 4.7 4.2  --   BUN 22* 31* 33* 24*  CREATININE 1.2* 1.0 1.0 0.8   Liver Function Tests:  Recent Labs  01/23/16  AST 13  ALT 11  ALKPHOS 65   CBC:  Recent Labs  01/23/16 08/20/16 09/04/16  WBC 7.7 7.1 4.3  NEUTROABS 3  --   --   HGB 13.0 11.6* 10.7*  HCT 41 35* 32*  PLT 204 198 136*   Lipid Panel:  Recent Labs  11/25/16  HDL 56    ASSESSMENT/PLAN:  Seizure - no seizure episodes, continue Depakote 125 mg sprinkle 2 tabs = 250 mg PO BID  Essential hypertension - well-controlled; continue Metoprolol tartrate 25 mg 1 tab PO BID, Cozaar 100 mg 1 tab PO Q D and Norvasc 2.5 mg 1 tab PO Q D  Anxiety - mood is stable; continue Ativan 0.5 mg 1/2 tab = 0.25 mg PO BID  Chronic constipation - continue Docusate 50 mg/70ml give 20 ml = 200 mg PO  BID  GERD - continue Protonix 20 mg PO Q D      Goals of care:  Long-term care  Mahari Vankirk C. Solon - NP    [employer] [phone]

## 2018-01-06 ENCOUNTER — Non-Acute Institutional Stay: Payer: Medicare Other | Admitting: Internal Medicine

## 2018-01-09 ENCOUNTER — Other Ambulatory Visit: Payer: Medicare Other | Admitting: Internal Medicine

## 2018-01-09 DIAGNOSIS — Z7189 Other specified counseling: Secondary | ICD-10-CM

## 2018-01-13 ENCOUNTER — Encounter: Payer: Medicare Other | Admitting: Internal Medicine

## 2018-01-15 ENCOUNTER — Non-Acute Institutional Stay: Payer: Medicare Other | Admitting: Internal Medicine

## 2018-01-15 DIAGNOSIS — Z7189 Other specified counseling: Secondary | ICD-10-CM | POA: Insufficient documentation

## 2018-01-15 NOTE — Progress Notes (Signed)
PALLIATIVE CARE CONSULT VISIT   PATIENT NAME: [name] DOB: 06-28-1931 MRN: [id number]  PRIMARY CARE PROVIDER:   Dr. Bernette RedbirdAmy Lambert  REFERRING PROVIDER:      Caleen JobsQueeneth Mbemna, DNP [facility]  RESPONSIBLE PARTY:   [name](daughter) [phone]  RECOMMENDATIONS and PLAN:  1.  Advanced care planning/discussion Z71.89:  Joint ACP meeting with daughter [name] and [facility] NP to review current patient status, trajectory and goals of care.  Daughter is interested in comfort measures with attempts to continue to improve sacral decubitus and provide continued nourishment.  Hospice care was recommended and was previously agreed upon by Dr. Smith [facility]azzocchi of [facility].  Daughter is in agreement with [facility] as [name] was formally a Hospice patient in 2016 while in [place].    I spent 30 minutes providing this consultation,  from  to 09:00am to 09:30am at [facility]. More than 50% of the time in this consultation was spent coordinating communication with daughter, NP and nursing staff.  HISTORY OF PRESENT ILLNESS: Follow-up with [name] finds that she has not had any acute changes since last visit and is still in a state of cognitive and functional decline with advanced Alzheimer's, protein calorie malnutrition and a stage IV sacrl decubitus.    CODE STATUS: DNAR/DNI  PPS: 30%  In wheelchair for meals HOSPICE ELIGIBILITY/DIAGNOSIS: YES(Advanced Alzheimer's without behavioral disturbances, protein calorie malnutrition with sacral ulcer)  PAST MEDICAL HISTORY:  Past Medical History:  Diagnosis Date  . Alzheimer disease    Without behavioral disturbance  . Anxiety   . Asthma   . Borderline diabetes   . CKD (chronic kidney disease)   . Conjunctivitis    Right  . Constipation, chronic   . Decubitus ulcer   . Dementia   . GERD (gastroesophageal reflux disease)   . HLD (hyperlipidemia)   . Hypercholesteremia   . Hypertension   .  Hyponatremia   . Osteoarthrosis   . Pneumonia 10/2012  . RLS (restless legs syndrome)   . Seizures (HCC)    "one 5 years ago; none since as far as I know" (08/03/2013)  . Stroke [facility](HCC)    "said she had a mini stroke" (08/03/2013)  . Vitamin D deficiency     SOCIAL HX:  Social History   Tobacco Use  . Smoking status: Never Smoker  . Smokeless tobacco: Never Used  Substance Use Topics  . Alcohol use: No    ALLERGIES:  Allergies  Allergen Reactions  . Penicillins Other (See Comments)    Per mar     PERTINENT MEDICATIONS:  Outpatient Encounter Medications as of 01/15/2018  Medication Sig  . acetaminophen (TYLENOL) 325 MG tablet Take 650 mg by mouth 2 (two) times daily.   Marland Kitchen. amLODipine (NORVASC) 2.5 MG tablet Take 2.5 mg by mouth daily.  Marland Kitchen. aspirin 81 MG chewable tablet Chew 81 mg by mouth every other day.  . Cholecalciferol (VITAMIN D-3 PO) Take 1,000 Units by mouth daily.  . divalproex (DEPAKOTE SPRINKLE) 125 MG capsule Take two tablets = 250mg  by mouth twice daily for seizures. DO NOT CRUSH OR CHEW  . docusate (COLACE) 50 MG/5ML liquid Take 200 mg by mouth 2 (two) times daily.   . folic acid (FOLVITE) 1 MG tablet Take 1 mg by mouth every other day.   Marland Kitchen. LORazepam (ATIVAN) 0.5 MG tablet Take 1/2 tablet by mouth twice daily for anxiety  . losartan (COZAAR) 100 MG tablet Take 100 mg by mouth daily.   .Marland Kitchen  memantine (NAMENDA) 10 MG tablet Take 10 mg by mouth 2 (two) times daily.   . metoprolol tartrate (LOPRESSOR) 25 MG tablet Take 25 mg by mouth 2 (two) times daily.  . Multiple Vitamins-Minerals (DECUBI-VITE) CAPS Take 1 capsule by mouth daily.  . pantoprazole (PROTONIX) 20 MG tablet Take 20 mg by mouth daily.  . Protein (PROCEL PO) Take 1 scoop by mouth 2 (two) times daily.   Marland Kitchen rOPINIRole (REQUIP) 0.25 MG tablet Take 0.25 mg by mouth at bedtime.   . rosuvastatin (CRESTOR) 5 MG tablet Take 5 mg by mouth daily.   . tizanidine (ZANAFLEX) 2 MG capsule Take 2 mg by mouth daily.  Marland Kitchen UNABLE  TO FIND Med Name: MedPass 90 mL QID   No facility-administered encounter medications on file as of 01/15/2018.     PHYSICAL EXAM:   General: Frail, fragile and thin elderly female resting in wheelchair  In NAD Cardiovascular: regular rate and rhythm Pulmonary: clear ant fields Abdomen: soft, nontender, + bowel sounds GU: foley cathetar in place Extremities: no edema, no muscle mass.  Contractures of hands and knees Skin:Reports of a stage IV sacral wound but not visualized due to dressing Neurological:  Arouses briefly to stimulus.  Nonverbal.  Unable to determine orientation due to cognitive deficit  Ashley Sheffield, NP-C

## 2018-01-15 NOTE — Progress Notes (Signed)
PALLIATIVE CARE CONSULT VISIT   PATIENT NAME: Ashley FenderFrances Johanning DOB: June 19, 1931 MRN: [id number]  PRIMARY CARE PROVIDER:   Dr. Bernette RedbirdAmy Rosenthal  REFERRING PROVIDER:   Caleen JobsQueeneth Mbemna, DNP     [facility]    RESPONSIBLE PARTY:   Aris Evertsaula Fildes-Jarrell(daughter/HCPOA)  [phone]      RECOMMENDATIONS and PLAN:  1.Protein calorie malnutrition E46:  Last weight was 115# on 12/23/17 which is a decrease from 118# on 10/14/17. Albumin 3.5 in Nov. 2018.  Continue MedPass supplements plus assist with meals.    2. Alzheimer's dementia without behavioral disturbance G30.9: Advanced with cognitive and functional decline.  FAST stage 7d. Continue all supportive care  3.Sacral decubitus ulcer stage IV L89.154:  Managed by wound care team.  Agree with diversional foley catheter. Out of bed in chair for meals only to decrease constant pressure. Reposition q2hrs while in bed.  4. Advanced care planning  Z71.89:  DNR/DNI.  Plan on discussion with daughter/HCPOA to review goals of care and advanced care planning. Pt. Is appropriate for Hospice care due to decline of health.  Palliative care will continue to follow.  I spent 30 minutes providing this consultation,  from 12:30pm to 1:00pm [facility]. More than 50% of the time in this consultation was spent coordinating communication with daughter and facility staff.   HISTORY OF PRESENT ILLNESS:  [name] is a 84 y.o. year old female with multiple medical problems including advanced alzheimer's dementia , protein calorie malnutrition, stage IV sacral decubitus. Clinical staff report that patient has had decline of health with worsening of a sacral decubitus along with cognitive and functional decline.  She requires total care, is incontinent of bowel and bladder. She requires feeding and eats from 50-100% per reports. She has lost 4lbs in the past 3 months.  Palliative Care was asked to help address goals of care and advanced care planning.    CODE STATUS:  DNR/DNI  PPS: 40% Up in wheelchair for meals HOSPICE ELIGIBILITY/DIAGNOSIS: YES/ advanced alzheimer's without behavioral disturbance/ protein calorie malnutrition  PAST MEDICAL HISTORY:  Past Medical History:  Diagnosis Date  . Alzheimer disease    Without behavioral disturbance  . Anxiety   . Asthma   . Borderline diabetes   . CKD (chronic kidney disease)   . Conjunctivitis    Right  . Constipation, chronic   . Decubitus ulcer   . Dementia   . GERD (gastroesophageal reflux disease)   . HLD (hyperlipidemia)   . Hypercholesteremia   . Hypertension   . Hyponatremia   . Osteoarthrosis   . Pneumonia 10/2012  . RLS (restless legs syndrome)   . Seizures (HCC)    "one 5 years ago; none since as far as I know" (08/03/2013)  . Stroke [facility](HCC)    "said she had a mini stroke" (08/03/2013)  . Vitamin D deficiency     SOCIAL HX:  Social History   Tobacco Use  . Smoking status: Never Smoker  . Smokeless tobacco: Never Used  Substance Use Topics  . Alcohol use: No    ALLERGIES:  Allergies  Allergen Reactions  . Penicillins Other (See Comments)    Per mar     PERTINENT MEDICATIONS:  Outpatient Encounter Medications as of 01/06/2018  Medication Sig  . acetaminophen (TYLENOL) 325 MG tablet Take 650 mg by mouth 2 (two) times daily.   Marland Kitchen. amLODipine (NORVASC) 2.5 MG tablet Take 2.5 mg by mouth daily.  Marland Kitchen. aspirin 81 MG chewable tablet Chew 81 mg by mouth  every other day.  . Cholecalciferol (VITAMIN D-3 PO) Take 1,000 Units by mouth daily.  . divalproex (DEPAKOTE SPRINKLE) 125 MG capsule Take two tablets = 250mg  by mouth twice daily for seizures. DO NOT CRUSH OR CHEW  . docusate (COLACE) 50 MG/5ML liquid Take 200 mg by mouth 2 (two) times daily.   . folic acid (FOLVITE) 1 MG tablet Take 1 mg by mouth every other day.   Marland Kitchen LORazepam (ATIVAN) 0.5 MG tablet Take 1/2 tablet by mouth twice daily for anxiety  . losartan (COZAAR) 100 MG tablet Take 100 mg by mouth daily.   . memantine  (NAMENDA) 10 MG tablet Take 10 mg by mouth 2 (two) times daily.   . metoprolol tartrate (LOPRESSOR) 25 MG tablet Take 25 mg by mouth 2 (two) times daily.  . Multiple Vitamins-Minerals (DECUBI-VITE) CAPS Take 1 capsule by mouth daily.  . pantoprazole (PROTONIX) 20 MG tablet Take 20 mg by mouth daily.  . Protein (PROCEL PO) Take 1 scoop by mouth 2 (two) times daily.   Marland Kitchen rOPINIRole (REQUIP) 0.25 MG tablet Take 0.25 mg by mouth at bedtime.   . rosuvastatin (CRESTOR) 5 MG tablet Take 5 mg by mouth daily.   . tizanidine (ZANAFLEX) 2 MG capsule Take 2 mg by mouth daily.  Marland Kitchen UNABLE TO FIND Med Name: MedPass 90 mL QID   No facility-administered encounter medications on file as of 01/06/2018.     PHYSICAL EXAM:   General: Frail, fragile and thin elderly female resting in bed in a fetal position Cardiovascular: regular rate and rhythm Pulmonary: clear ant fields Abdomen: soft, nontender, + bowel sounds GU: Foley catheter in place and patent of clear amber urine Extremities: Contractures of upper and lower extremities Skin: Reports of a stage IV sacral decubitus but not seen Neurological: Somnolent and aroused easily. Unable to determine orientation due to cognitive deficit.  Margaretha Sheffield, NP-C

## 2018-01-16 ENCOUNTER — Telehealth: Payer: Self-pay | Admitting: Internal Medicine

## 2018-01-16 NOTE — Telephone Encounter (Signed)
Courtesy phone call to daughter, Hilarie Fredricksonaula Breuer-Jarrell, for Hospice referral clarification after receiving call from [facility] referral center stating that the Hospice referral was cancelled by [facility] staff(per daughter's request). Daughter stated that it is still her preference to use [facility] for the Hospice referral for patient, Ashley FenderFrances Hitt and she communicated this to the DON of [facility]. I thanked her for the clarification.

## 2018-01-20 ENCOUNTER — Other Ambulatory Visit: Payer: Self-pay | Admitting: Internal Medicine

## 2018-01-20 DIAGNOSIS — S72001A Fracture of unspecified part of neck of right femur, initial encounter for closed fracture: Secondary | ICD-10-CM

## 2018-01-20 NOTE — Progress Notes (Signed)
    PALLIATIVE CARE CONSULT VISIT   PATIENT NAME: [name] DOB: 08-May-1931 MRN: [id number]  REFERRING PROVIDER:   Caleen JobsQueeneth Mbemna, DNP/ Dr. Doree Barthelosenthal [facility]  NOTE:  [facility] for a pre-scheduled advanced care plan meeting with daughter , [name] and [facility] Ashley.  Physician representative from [facility] was in attendance during meeting upon my arrival.  Writer cancelled today's meeting and after a later request of the daughter, the appointment was rescheduled to further discuss pt's current status and future plans on 01/15/18.  [name] and [name] were aware of daughter's plans for further discussion with [facility].     Ashley SheffieldShirley Gryphon Vanderveen, Ashley

## 2018-01-21 NOTE — Progress Notes (Signed)
Palliative care visit did not occur on this date.  Disregard.  Margaretha SheffieldShirley Brynlie Daza, NP-C

## 2018-01-22 ENCOUNTER — Ambulatory Visit
Admission: RE | Admit: 2018-01-22 | Discharge: 2018-01-22 | Disposition: A | Discharge status: Home / Self Care | Payer: Medicare Other | Source: Ambulatory Visit | Attending: Internal Medicine | Admitting: Internal Medicine

## 2018-01-22 DIAGNOSIS — S72001A Fracture of unspecified part of neck of right femur, initial encounter for closed fracture: Secondary | ICD-10-CM

## 2018-05-09 ENCOUNTER — Emergency Department (HOSPITAL_COMMUNITY)
Admission: EM | Admit: 2018-05-09 | Discharge: 2018-05-10 | Disposition: A | Discharge status: Home / Self Care | Attending: Emergency Medicine | Admitting: Emergency Medicine

## 2018-05-09 ENCOUNTER — Emergency Department (HOSPITAL_COMMUNITY)

## 2018-05-09 ENCOUNTER — Encounter (HOSPITAL_COMMUNITY): Payer: Self-pay

## 2018-05-09 ENCOUNTER — Other Ambulatory Visit: Payer: Self-pay

## 2018-05-09 DIAGNOSIS — Z96653 Presence of artificial knee joint, bilateral: Secondary | ICD-10-CM | POA: Insufficient documentation

## 2018-05-09 DIAGNOSIS — I129 Hypertensive chronic kidney disease with stage 1 through stage 4 chronic kidney disease, or unspecified chronic kidney disease: Secondary | ICD-10-CM | POA: Insufficient documentation

## 2018-05-09 DIAGNOSIS — S0990XA Unspecified injury of head, initial encounter: Secondary | ICD-10-CM | POA: Diagnosis present

## 2018-05-09 DIAGNOSIS — S0101XA Laceration without foreign body of scalp, initial encounter: Secondary | ICD-10-CM

## 2018-05-09 DIAGNOSIS — Z23 Encounter for immunization: Secondary | ICD-10-CM | POA: Insufficient documentation

## 2018-05-09 DIAGNOSIS — W19XXXA Unspecified fall, initial encounter: Secondary | ICD-10-CM | POA: Insufficient documentation

## 2018-05-09 DIAGNOSIS — Z7982 Long term (current) use of aspirin: Secondary | ICD-10-CM | POA: Insufficient documentation

## 2018-05-09 DIAGNOSIS — Z79899 Other long term (current) drug therapy: Secondary | ICD-10-CM | POA: Diagnosis not present

## 2018-05-09 DIAGNOSIS — Y92129 Unspecified place in nursing home as the place of occurrence of the external cause: Secondary | ICD-10-CM | POA: Diagnosis not present

## 2018-05-09 DIAGNOSIS — J45909 Unspecified asthma, uncomplicated: Secondary | ICD-10-CM | POA: Diagnosis not present

## 2018-05-09 DIAGNOSIS — Y939 Activity, unspecified: Secondary | ICD-10-CM | POA: Diagnosis not present

## 2018-05-09 DIAGNOSIS — N183 Chronic kidney disease, stage 3 (moderate): Secondary | ICD-10-CM | POA: Insufficient documentation

## 2018-05-09 DIAGNOSIS — G309 Alzheimer's disease, unspecified: Secondary | ICD-10-CM | POA: Insufficient documentation

## 2018-05-09 DIAGNOSIS — S0181XA Laceration without foreign body of other part of head, initial encounter: Secondary | ICD-10-CM | POA: Insufficient documentation

## 2018-05-09 DIAGNOSIS — Y999 Unspecified external cause status: Secondary | ICD-10-CM | POA: Diagnosis not present

## 2018-05-09 MED ORDER — TETANUS-DIPHTH-ACELL PERTUSSIS 5-2.5-18.5 LF-MCG/0.5 IM SUSP
0.5000 mL | Freq: Once | INTRAMUSCULAR | Status: AC
  Administered 2018-05-09: 0.5 mL via INTRAMUSCULAR
  Filled 2018-05-09: qty 0.5

## 2018-05-09 MED ORDER — BACITRACIN ZINC 500 UNIT/GM EX OINT
TOPICAL_OINTMENT | Freq: Once | CUTANEOUS | Status: AC
  Administered 2018-05-09: 23:00:00 via TOPICAL
  Filled 2018-05-09: qty 28.35

## 2018-05-09 MED ORDER — LIDOCAINE-EPINEPHRINE-TETRACAINE (LET) SOLUTION
3.0000 mL | Freq: Once | NASAL | Status: AC
  Administered 2018-05-09: 3 mL via TOPICAL
  Filled 2018-05-09: qty 3

## 2018-05-09 NOTE — ED Provider Notes (Signed)
[facility]-EMERGENCY DEPT Provider Note   CSN: [id number] Arrival date & time: 05/09/18  1859     History   Chief Complaint Chief Complaint  Patient presents with  . Head Laceration    HPI [name] is a 84 y.o. female past medical history of Alzheimer's presenting via EMS from [facility] nursing rehab facility for evaluation of head laceration.  Patient was found at approximate 615 this evening with blood on her pillow.  When staff looked at her head, they noticed a laceration to the left side of her head.  They are unsure of how the laceration appeared.  I talked with nursing supervisor at the facility who states that patient is not mobile.  She states that she only gets transferred with the assistance of someone and states that there is no been no reported falls or injuries.  She states that patient was last seen at about 430 or 5 when she was transferred from a chair into the bed.  At that time, she had no evidence of laceration.  Daughter is at bedside.  Denies any blood thinner to use.  She states that her mom is at mental baseline.  The history is provided by the nursing home. The history is limited by the condition of the patient.    Past Medical History:  Diagnosis Date  . Alzheimer disease    Without behavioral disturbance  . Anxiety   . Asthma   . Borderline diabetes   . CKD (chronic kidney disease)   . Conjunctivitis    Right  . Constipation, chronic   . Decubitus ulcer   . Dementia   . GERD (gastroesophageal reflux disease)   . HLD (hyperlipidemia)   . Hypercholesteremia   . Hypertension   . Hyponatremia   . Osteoarthrosis   . Pneumonia 10/2012  . RLS (restless legs syndrome)   . Seizures (HCC)    "one 5 years ago; none since as far as I know" (08/03/2013)  . Stroke [facility])    "said she had a mini stroke" (08/03/2013)  . Vitamin D deficiency     Patient Active Problem List   Diagnosis Date Noted  . Advanced care planning/counseling  discussion 01/15/2018  . Protein-calorie malnutrition (HCC) 11/19/2016  . Restless leg syndrome 11/19/2016  . Gastritis 11/19/2016  . Constipation, chronic 10/23/2015  . Essential hypertension, benign 10/23/2015  . Vitamin D deficiency 10/23/2015  . Alzheimer's dementia without behavioral disturbance 04/24/2015  . Sacral decubitus ulcer, stage IV (HCC) 04/24/2015  . Alzheimer's disease 11/02/2014  . Asthma, chronic 11/02/2014  . Generalized anxiety disorder 11/02/2014  . Hyperlipidemia 11/02/2014  . Seizures (HCC) 11/02/2014  . Anemia of chronic disease 11/02/2014  . Osteoarthrosis, unspecified whether generalized or localized, involving lower leg 11/02/2014  . Gastroesophageal reflux disease without esophagitis 11/02/2014  . Contracture, joint, hand 11/02/2014  . CKD (chronic kidney disease) stage 3, GFR 30-59 ml/min (HCC) 10/31/2012    Past Surgical History:  Procedure Laterality Date  . CATARACT EXTRACTION    . CATARACT EXTRACTION W/ INTRAOCULAR LENS  IMPLANT, BILATERAL Bilateral   . TOTAL KNEE ARTHROPLASTY Bilateral      OB History   None      Home Medications    Prior to Admission medications   Medication Sig Start Date End Date Taking? Authorizing Provider  acetaminophen (TYLENOL) 325 MG tablet Take 650 mg by mouth 2 (two) times daily.     [provider]  amLODipine (NORVASC) 2.5 MG tablet Take 2.5  mg by mouth daily.    [provider]  aspirin 81 MG chewable tablet Chew 81 mg by mouth every other day.    [provider]  Cholecalciferol (VITAMIN D-3 PO) Take 1,000 Units by mouth daily.    [provider]  divalproex (DEPAKOTE SPRINKLE) 125 MG capsule Take two tablets = 250mg  by mouth twice daily for seizures. DO NOT CRUSH OR CHEW    [provider]  docusate (COLACE) 50 MG/5ML liquid Take 200 mg by mouth 2 (two) times daily.     [provider]  folic acid (FOLVITE) 1 MG tablet Take 1 mg by mouth every other  day.     [provider]  LORazepam (ATIVAN) 0.5 MG tablet Take 1/2 tablet by mouth twice daily for anxiety 02/09/16   Kirt Boys, DO  losartan (COZAAR) 100 MG tablet Take 100 mg by mouth daily.     [provider]  memantine (NAMENDA) 10 MG tablet Take 10 mg by mouth 2 (two) times daily.     [provider]  metoprolol tartrate (LOPRESSOR) 25 MG tablet Take 25 mg by mouth 2 (two) times daily.    [provider]  Multiple Vitamins-Minerals (DECUBI-VITE) CAPS Take 1 capsule by mouth daily.    [provider]  pantoprazole (PROTONIX) 20 MG tablet Take 20 mg by mouth daily.    [provider]  Protein (PROCEL PO) Take 1 scoop by mouth 2 (two) times daily.     [provider]  rOPINIRole (REQUIP) 0.25 MG tablet Take 0.25 mg by mouth at bedtime.     [provider]  rosuvastatin (CRESTOR) 5 MG tablet Take 5 mg by mouth daily.     [provider]  tizanidine (ZANAFLEX) 2 MG capsule Take 2 mg by mouth daily.    [provider]  UNABLE TO FIND Med Name: MedPass 90 mL QID    [provider]    Family History No family history on file.  Social History Social History   Tobacco Use  . Smoking status: Never Smoker  . Smokeless tobacco: Never Used  Substance Use Topics  . Alcohol use: No  . Drug use: No     Allergies   Penicillins   Review of Systems Review of Systems  Unable to perform ROS: Dementia     Physical Exam Updated Vital Signs BP (!) 147/99 (BP Location: Left Leg)   Pulse 73   Temp 98.2 F (36.8 C) (Axillary)   Resp 18   Wt 49.9 kg   SpO2 100%   BMI 20.12 kg/m   Physical Exam  Constitutional: She appears well-developed and well-nourished.  HENT:  Head: Normocephalic and atraumatic.    Eyes: Conjunctivae and EOM are normal. Right eye exhibits no discharge. Left eye exhibits no discharge. No scleral icterus.  Cardiovascular: Normal rate and regular rhythm.    Pulses:      Carotid pulses are 2+ on the right side, and 2+ on the left side.      Dorsalis pedis pulses are 2+ on the right side, and 2+ on the left side.  Pulmonary/Chest: Effort normal and breath sounds normal.  Musculoskeletal:  No deformities or crepitus noted to bilateral upper extremities.  No deformity or crepitus noted to bilateral lower extremities.  Neurological: She is alert.  Left upper extremity contracture Moving all extremities spontaneously.  Skin: Skin is warm and dry.  No bruising or ecchymosis noted to the chest, back, abdomen, bilateral upper  or lower extremities.  Psychiatric: She has a normal mood and affect. Her speech is normal and behavior is normal.  Nursing note and vitals reviewed.    ED Treatments / Results  Labs (all labs ordered are listed, but only abnormal results are displayed) Labs Reviewed - No data to display  EKG None  Radiology Ct Head Wo Contrast  Result Date: 05/09/2018 CLINICAL DATA:  Posttraumatic headache after fall. EXAM: CT HEAD WITHOUT CONTRAST TECHNIQUE: Contiguous axial images were obtained from the base of the skull through the vertex without intravenous contrast. COMPARISON:  CT scan of May 26, 2014. FINDINGS: Brain: Mild diffuse cortical atrophy is noted. Mild chronic ischemic white matter disease. No mass effect or midline shift is noted. Ventricular dilatation is noted most likely due to surrounding atrophy. There is no evidence of mass lesion, hemorrhage or acute infarction. Vascular: No hyperdense vessel or unexpected calcification. Skull: Normal. Negative for fracture or focal lesion. Sinuses/Orbits: No acute finding. Other: None. IMPRESSION: Mild diffuse cortical atrophy. Mild chronic ischemic white matter disease. No acute intracranial abnormality seen. Electronically Signed   By: Lupita Raider, M.D.   On: 05/09/2018 20:17    Procedures .Marland KitchenLaceration Repair Date/Time: 05/09/2018 9:37 PM Performed by: Maxwell Caul, PA-C Authorized by: Maxwell Caul, PA-C   Consent:    Consent obtained:  Verbal   Consent given by: Daughter.   Risks discussed:  Pain, infection and retained foreign body Anesthesia (see MAR for exact dosages):    Anesthesia method:  Topical application   Topical anesthetic:  LET Laceration details:    Location:  Scalp   Scalp location:  L temporal   Length (cm):  3 Repair type:    Repair type:  Simple Exploration:    Hemostasis achieved with:  LET   Wound extent: no foreign bodies/material noted   Treatment:    Area cleansed with:  Betadine   Amount of cleaning:  Standard   Irrigation solution:  Sterile saline   Irrigation method:  Syringe   Visualized foreign bodies/material removed: no   Skin repair:    Repair method:  Staples   Number of staples:  4 Approximation:    Approximation:  Close Post-procedure details:    Dressing:  Antibiotic ointment   Patient tolerance of procedure:  Tolerated well, no immediate complications   (including critical care time)  Medications Ordered in ED Medications  lidocaine-EPINEPHrine-tetracaine (LET) solution (3 mLs Topical Given 05/09/18 1951)  Tdap (BOOSTRIX) injection 0.5 mL (0.5 mLs Intramuscular Given 05/09/18 1951)  bacitracin ointment ( Topical Given 05/09/18 2239)     Initial Impression / Assessment and Plan / ED Course  I have reviewed the triage vital signs and the nursing notes.  Pertinent labs & imaging results that were available during my care of the patient were reviewed by me and considered in my medical decision making (see chart for details).     84 year old female with past medical history of Alzheimer's presented from nursing home facility with complaints of head laceration.  Patient was found approximately-615 this evening with blood on her pillow noted to have a head laceration.  Unclear of how this came.  No history of trauma, fall.  Patient is not mobile.  Discussed with Ramona, the nurse supervisor at  the nursing home facility.  She does not know what happened.  There is no report of injury or trauma.  She reports that earlier in the day, patient had been assessed by the tech there and  was no evidence of laceration.  She was able to sit in a chair and had been seen throughout the day without any difficulties.  They report that she was laid down from the chair into the bed and up between about 5 and 6 with no issues.  They report about 6:00 they noticed blood on the pillow.  They state that patient is at baseline.  Will update tetanus and plan for CT head for evaluation.  CT head unremarkable.   Laceration repaired as documented above.  Patient tolerated procedure well without any difficulty.  No other obvious injury noted on physical exam.  Patient stable for discharge at this time\.  Wound care instructions provided with patient's discharge papers.  Portions of this note were generated with Scientist, clinical (histocompatibility and immunogenetics). Dictation errors may occur despite best attempts at proofreading.    Final Clinical Impressions(s) / ED Diagnoses   Final diagnoses:  Laceration of scalp, initial encounter    ED Discharge Orders    None       Rosana Hoes 05/09/18 2343    Derwood Kaplan, MD 05/10/18 1507

## 2018-05-09 NOTE — ED Triage Notes (Addendum)
Per ems: pt coming from [facility] c/o back of head laceration. Bleeding controlled. No blood thinners. Facility not sure if there was a fall. Pt at baseline per facility. Nonverbal and contracted

## 2018-05-09 NOTE — ED Notes (Signed)
Patient transported to CT 

## 2018-05-09 NOTE — ED Notes (Signed)
Contacted [employer] to have pt transported back to [facility] via [facility]

## 2018-05-09 NOTE — ED Notes (Signed)
Bed: WHALB Expected date:  Expected time:  Means of arrival:  Comments: 

## 2018-05-09 NOTE — ED Notes (Signed)
Bed: ZO10 Expected date:  Expected time:  Means of arrival:  Comments: HALL B

## 2018-05-09 NOTE — ED Notes (Signed)
Pt's daughter attempted to sign the Topaz; however, the machine does not work and pt daughter verbalized signature. This Rn and Ladona Ridgel, Rn witness

## 2018-05-09 NOTE — Discharge Instructions (Addendum)
Apply neosporin and bacitracin two times a day.  Follow-up with your primary care doctor or return emergency department in 5 to 7 days to get the staples removed.  Return to emergency department for any vomiting, drainage, redness around the laceration or any other worsening or concerning symptoms.

## 2018-05-09 NOTE — ED Notes (Addendum)
Lidocaine applied by RN. Waiting for PA.

## 2019-08-13 DEATH — deceased
